# Patient Record
Sex: Male | Born: 1945
Health system: Southern US, Community
[De-identification: ages and names within clinical notes are randomized; demographics above are authoritative.]

## PROBLEM LIST (undated history)

## (undated) DIAGNOSIS — N400 Enlarged prostate without lower urinary tract symptoms: Secondary | ICD-10-CM

## (undated) DIAGNOSIS — M1990 Unspecified osteoarthritis, unspecified site: Secondary | ICD-10-CM

## (undated) DIAGNOSIS — K4020 Bilateral inguinal hernia, without obstruction or gangrene, not specified as recurrent: Secondary | ICD-10-CM

## (undated) DIAGNOSIS — I341 Nonrheumatic mitral (valve) prolapse: Secondary | ICD-10-CM

## (undated) DIAGNOSIS — I340 Nonrheumatic mitral (valve) insufficiency: Secondary | ICD-10-CM

## (undated) DIAGNOSIS — C61 Malignant neoplasm of prostate: Secondary | ICD-10-CM

## (undated) DIAGNOSIS — R011 Cardiac murmur, unspecified: Secondary | ICD-10-CM

## (undated) DIAGNOSIS — C801 Malignant (primary) neoplasm, unspecified: Secondary | ICD-10-CM

## (undated) DIAGNOSIS — D649 Anemia, unspecified: Secondary | ICD-10-CM

## (undated) DIAGNOSIS — J189 Pneumonia, unspecified organism: Secondary | ICD-10-CM

## (undated) HISTORY — DX: Benign prostatic hyperplasia without lower urinary tract symptoms: N40.0

## (undated) HISTORY — DX: Nonrheumatic mitral (valve) insufficiency: I34.0

## (undated) HISTORY — PX: HERNIA REPAIR: SHX51

## (undated) HISTORY — PX: CARDIAC CATHETERIZATION: SHX172

## (undated) HISTORY — PX: PROSTATE BIOPSY: SHX241

## (undated) HISTORY — PX: VASECTOMY: SHX75

## (undated) HISTORY — DX: Nonrheumatic mitral (valve) prolapse: I34.1

## (undated) HISTORY — PX: TONSILLECTOMY AND ADENOIDECTOMY: SUR1326

---

## 1975-11-06 HISTORY — PX: VASECTOMY: SHX75

## 2000-10-15 ENCOUNTER — Ambulatory Visit (HOSPITAL_BASED_OUTPATIENT_CLINIC_OR_DEPARTMENT_OTHER): Admission: RE | Admit: 2000-10-15 | Discharge: 2000-10-15 | Payer: Self-pay | Admitting: Plastic Surgery

## 2012-01-02 ENCOUNTER — Other Ambulatory Visit: Payer: Self-pay | Admitting: Emergency Medicine

## 2012-01-02 ENCOUNTER — Ambulatory Visit (INDEPENDENT_AMBULATORY_CARE_PROVIDER_SITE_OTHER): Payer: Medicare Other | Admitting: Emergency Medicine

## 2012-01-02 VITALS — BP 138/74 | HR 66 | Temp 98.1°F | Resp 16

## 2012-01-02 DIAGNOSIS — Z111 Encounter for screening for respiratory tuberculosis: Secondary | ICD-10-CM

## 2012-01-02 DIAGNOSIS — Z139 Encounter for screening, unspecified: Secondary | ICD-10-CM

## 2012-01-04 NOTE — Progress Notes (Signed)
The patient comes here yearly for PPD placement. He is having no symptoms he is here for testing only.

## 2012-01-05 ENCOUNTER — Ambulatory Visit (INDEPENDENT_AMBULATORY_CARE_PROVIDER_SITE_OTHER): Payer: Medicare Other

## 2012-01-05 DIAGNOSIS — Z111 Encounter for screening for respiratory tuberculosis: Secondary | ICD-10-CM

## 2012-01-05 LAB — TB SKIN TEST: TB Skin Test: NEGATIVE mm

## 2012-01-12 ENCOUNTER — Ambulatory Visit (INDEPENDENT_AMBULATORY_CARE_PROVIDER_SITE_OTHER): Payer: Medicare Other | Admitting: Family Medicine

## 2012-01-12 VITALS — BP 131/75 | HR 65 | Temp 98.1°F | Resp 16 | Ht 69.5 in | Wt 187.0 lb

## 2012-01-12 DIAGNOSIS — J069 Acute upper respiratory infection, unspecified: Secondary | ICD-10-CM

## 2012-01-12 DIAGNOSIS — R05 Cough: Secondary | ICD-10-CM

## 2012-01-12 DIAGNOSIS — J310 Chronic rhinitis: Secondary | ICD-10-CM

## 2012-01-12 DIAGNOSIS — R059 Cough, unspecified: Secondary | ICD-10-CM

## 2012-01-12 DIAGNOSIS — J309 Allergic rhinitis, unspecified: Secondary | ICD-10-CM

## 2012-01-12 MED ORDER — AZITHROMYCIN 250 MG PO TABS: ORAL_TABLET | ORAL | Status: AC

## 2012-01-12 NOTE — Progress Notes (Signed)
  [facility]:  Office Visit  Chief Complaint:  Chief Complaint  Patient presents with  . Cough    productive especially at night x 5 days  . Facial Pain    sinus pain and pressure x 5 days    HPI: Thomas [name] is a 66 y.o. male who complains of  5 day history of productive cough and sinus pain, however sxs are improving. He tried OTC treatment and increased fluids with some relief. Denies fevers, chills, SOB, wheezing. Denies asthma or allergies. Nonsmoker.  Past Medical History  Diagnosis Date  . BPH (benign prostatic hyperplasia)    Past Surgical History  Procedure Date  . Tonsillectomy and adenoidectomy    History   Social History  . Marital Status: Unknown    Spouse Name: N/A    Number of Children: N/A  . Years of Education: N/A   Social History Main Topics  . Smoking status: Never Smoker   . Smokeless tobacco: Not on file  . Alcohol Use: Not on file  . Drug Use: Not on file  . Sexually Active: Not on file   Other Topics Concern  . Not on file   Social History Narrative  . No narrative on file   No family history on file. No Known Allergies Prior to Admission medications   Medication Sig Start Date End Date Taking? Authorizing Provider  aspirin 81 MG tablet Take 81 mg by mouth daily.   Yes Historical Provider, MD  Docosahexaenoic Acid (DHA COMPLETE PO) Take 1 tablet by mouth daily.   Yes Historical Provider, MD  Multiple Vitamin (MULTIVITAMIN) tablet Take 1 tablet by mouth daily.   Yes Historical Provider, MD     ROS: The patient denies fevers, chills, night sweats, unintentional weight loss, chest pain, palpitations, wheezing, dyspnea on exertion, nausea, vomiting, abdominal pain, dysuria, hematuria, melena, numbness, weakness, or tingling.  All other systems have been reviewed and were otherwise negative with the exception of those mentioned in the HPI and as above.    PHYSICAL EXAM: Filed Vitals:   01/12/12 1739  BP: 131/75    Pulse: 65  Temp: 98.1 F (36.7 C)  Resp: 16   Filed Vitals:   01/12/12 1739  Height: 5' 9.5" (1.765 m)  Weight: 187 lb (84.823 kg)   Body mass index is 27.22 kg/(m^2).  General: Alert, no acute distress HEENT:  Normocephalic, atraumatic, oropharynx patent. TM nl,+ red boggy nares. Sinuses nontender Cardiovascular:  Regular rate and rhythm, no rubs murmurs or gallops.  No Carotid bruits, radial pulse intact. No pedal edema.  Respiratory: Clear to auscultation bilaterally.  No wheezes, rales, or rhonchi.  No cyanosis, no use of accessory musculature GI: No organomegaly, abdomen is soft and non-tender, positive bowel sounds.  No masses. Skin: No rashes. Neurologic: Facial musculature symmetric. Psychiatric: Patient is appropriate throughout our interaction. Lymphatic: No cervical lymphadenopathy Musculoskeletal: Gait intact.  ASSESSMENT/PLAN: Encounter Diagnoses  Name Primary?  . Rhinitis Yes  . Cough   . Viral URI with cough     Sxs treatment only. Patient declined flonase. Patient has a rx for Z-pack sent to pharmacy to pick up in 4-5 days if sxs worsen.     Hamilton Capri PHUONG, DO 01/12/2012 6:40 PM

## 2012-10-31 ENCOUNTER — Other Ambulatory Visit: Payer: Self-pay | Admitting: Family Medicine

## 2012-10-31 ENCOUNTER — Ambulatory Visit
Admission: RE | Admit: 2012-10-31 | Discharge: 2012-10-31 | Disposition: A | Discharge status: Home / Self Care | Payer: Medicare Other | Source: Ambulatory Visit | Attending: Family Medicine | Admitting: Family Medicine

## 2012-10-31 DIAGNOSIS — R52 Pain, unspecified: Secondary | ICD-10-CM

## 2013-09-17 ENCOUNTER — Other Ambulatory Visit (HOSPITAL_COMMUNITY): Payer: Self-pay | Admitting: Urology

## 2013-09-17 DIAGNOSIS — C61 Malignant neoplasm of prostate: Secondary | ICD-10-CM

## 2013-11-13 ENCOUNTER — Ambulatory Visit (HOSPITAL_COMMUNITY)
Admission: RE | Admit: 2013-11-13 | Discharge: 2013-11-13 | Disposition: A | Discharge status: Home / Self Care | Payer: Medicare Other | Source: Ambulatory Visit | Attending: Urology | Admitting: Urology

## 2013-11-13 DIAGNOSIS — N402 Nodular prostate without lower urinary tract symptoms: Secondary | ICD-10-CM | POA: Insufficient documentation

## 2013-11-13 DIAGNOSIS — R972 Elevated prostate specific antigen [PSA]: Secondary | ICD-10-CM | POA: Insufficient documentation

## 2013-11-13 DIAGNOSIS — C61 Malignant neoplasm of prostate: Secondary | ICD-10-CM

## 2013-11-13 LAB — POCT I-STAT CREATININE: Creatinine, Ser: 1 mg/dL (ref 0.50–1.35)

## 2013-11-13 MED ORDER — GADOBENATE DIMEGLUMINE 529 MG/ML IV SOLN
20.0000 mL | Freq: Once | INTRAVENOUS | Status: AC | PRN
  Administered 2013-11-13: 18 mL via INTRAVENOUS

## 2014-05-20 ENCOUNTER — Encounter: Payer: Self-pay | Admitting: Cardiology

## 2014-06-03 ENCOUNTER — Encounter: Payer: Self-pay | Admitting: Cardiology

## 2014-07-09 ENCOUNTER — Ambulatory Visit: Payer: Medicare Other | Admitting: Cardiology

## 2014-07-14 ENCOUNTER — Encounter: Payer: Self-pay | Admitting: Cardiology

## 2014-07-14 ENCOUNTER — Ambulatory Visit (INDEPENDENT_AMBULATORY_CARE_PROVIDER_SITE_OTHER): Payer: Medicare Other | Admitting: Cardiology

## 2014-07-14 VITALS — BP 120/78 | HR 65 | Ht 70.0 in | Wt 174.0 lb

## 2014-07-14 DIAGNOSIS — I059 Rheumatic mitral valve disease, unspecified: Secondary | ICD-10-CM

## 2014-07-14 DIAGNOSIS — I341 Nonrheumatic mitral (valve) prolapse: Secondary | ICD-10-CM | POA: Insufficient documentation

## 2014-07-14 NOTE — Progress Notes (Signed)
      [facility]. [address] [place], [place]  [address] Phone: [phone] Fax:  [phone]  Date:  07/14/2014   ID:  Thomas [name], DOB October 04, 1946, MRN [id number]  PCP:  Cammy Copa, MD   History of Present Illness: Thomas [name] is a 68 y.o. male here for followup of mitral valve prolapse (posterior leaflet), moderate mitral regurgitation from echocardiogram of 07/25/2012 (heart murmur). Prior [employer]. Recently had a friend who unfortunately had complications after aortic valve surgery.  He is feeling well, no shortness of breath, no chest pain, no syncope. Quite active.   Wt Readings from Last 3 Encounters:  07/14/14 174 lb (78.926 kg)  01/12/12 187 lb (84.823 kg)     Past Medical History  Diagnosis Date  . BPH (benign prostatic hyperplasia)     Past Surgical History  Procedure Laterality Date  . Tonsillectomy and adenoidectomy      Current Outpatient Prescriptions  Medication Sig Dispense Refill  . aspirin 81 MG tablet Take 81 mg by mouth daily.      . Multiple Vitamin (MULTIVITAMIN) tablet Take 1 tablet by mouth daily.       No current facility-administered medications for this visit.    Allergies:   No Known Allergies  Social History:  The patient  reports that he has never smoked. He does not have any smokeless tobacco history on file.   No family history on file.  ROS:  Please see the history of present illness.   Denies any chest pain, syncope, dyspnea, no palpitations.   All other systems reviewed and negative.   PHYSICAL EXAM: VS:  BP 120/78  Pulse 65  Ht 5\' 10"  (1.778 m)  Wt 174 lb (78.926 kg)  BMI 24.97 kg/m2 Well nourished, well developed, in no acute distress HEENT: normal, [place]/AT, EOMI Neck: no JVD, normal carotid upstroke, no bruit Cardiac:  normal S1, S2; RRR; 3/6 holosystolic apical murmur Lungs:  clear to auscultation bilaterally, no wheezing, rhonchi or rales Abd: soft, nontender, no hepatomegaly, no  bruits Ext: no edema, 2+ distal pulses Skin: warm and dry GU: deferred Neuro: no focal abnormalities noted, AAO x 3  EKG:  07/14/14-sinus rhythm, heart rate 65, slightly peaked T-wave in V3, V4, no other significant abnormalities.      ASSESSMENT AND PLAN:  1. Mitral valve regurgitation - previously moderate with posterior leaflet prolapse. We will check echocardiogram. He knows to contact me if symptoms occur. We discussed mitral valve repair in the setting of severe mitral regurgitation. 2. One-year followup  Signed, Candee Furbish, MD [facility]  07/14/2014 8:43 AM

## 2014-07-14 NOTE — Patient Instructions (Signed)
The current medical regimen is effective;  continue present plan and medications.  Your physician has requested that you have an echocardiogram. Echocardiography is a painless test that uses sound waves to create images of your heart. It provides your doctor with information about the size and shape of your heart and how well your heart's chambers and valves are working. This procedure takes approximately one hour. There are no restrictions for this procedure.  Follow up in 1 year with Dr. [name].  You will receive a letter in the mail 2 months before you are due.  Please call us when you receive this letter to schedule your follow up appointment.

## 2014-07-19 ENCOUNTER — Ambulatory Visit (HOSPITAL_COMMUNITY): Payer: Medicare Other | Attending: Cardiology | Admitting: Radiology

## 2014-07-19 DIAGNOSIS — R011 Cardiac murmur, unspecified: Secondary | ICD-10-CM | POA: Insufficient documentation

## 2014-07-19 DIAGNOSIS — I059 Rheumatic mitral valve disease, unspecified: Secondary | ICD-10-CM | POA: Diagnosis not present

## 2014-07-19 NOTE — Progress Notes (Signed)
Echocardiogram performed.  

## 2014-07-21 ENCOUNTER — Telehealth: Payer: Self-pay | Admitting: Cardiology

## 2014-07-21 NOTE — Telephone Encounter (Signed)
Follow up ° ° ° ° ° °Returning Anita's call °

## 2014-07-21 NOTE — Telephone Encounter (Signed)
Left pt a message to call back. 

## 2014-07-21 NOTE — Telephone Encounter (Signed)
New message ° ° ° ° ° °Want echo results °

## 2014-07-22 NOTE — Telephone Encounter (Signed)
Follow up   ° ° ° °Patient calling back for test results  °

## 2014-07-22 NOTE — Telephone Encounter (Signed)
Pt informed of echo results.

## 2016-11-14 DIAGNOSIS — D225 Melanocytic nevi of trunk: Secondary | ICD-10-CM | POA: Diagnosis not present

## 2016-11-14 DIAGNOSIS — X32XXXD Exposure to sunlight, subsequent encounter: Secondary | ICD-10-CM | POA: Diagnosis not present

## 2016-11-14 DIAGNOSIS — L570 Actinic keratosis: Secondary | ICD-10-CM | POA: Diagnosis not present

## 2016-12-26 DIAGNOSIS — C61 Malignant neoplasm of prostate: Secondary | ICD-10-CM | POA: Diagnosis not present

## 2017-03-08 DIAGNOSIS — C61 Malignant neoplasm of prostate: Secondary | ICD-10-CM | POA: Diagnosis not present

## 2017-04-30 DIAGNOSIS — R1033 Periumbilical pain: Secondary | ICD-10-CM | POA: Diagnosis not present

## 2017-04-30 DIAGNOSIS — X32XXXD Exposure to sunlight, subsequent encounter: Secondary | ICD-10-CM | POA: Diagnosis not present

## 2017-04-30 DIAGNOSIS — K4091 Unilateral inguinal hernia, without obstruction or gangrene, recurrent: Secondary | ICD-10-CM | POA: Diagnosis not present

## 2017-04-30 DIAGNOSIS — L570 Actinic keratosis: Secondary | ICD-10-CM | POA: Diagnosis not present

## 2017-05-06 DIAGNOSIS — K4010 Bilateral inguinal hernia, with gangrene, not specified as recurrent: Secondary | ICD-10-CM | POA: Diagnosis not present

## 2017-05-14 DIAGNOSIS — R1084 Generalized abdominal pain: Secondary | ICD-10-CM | POA: Diagnosis not present

## 2017-05-28 ENCOUNTER — Encounter (INDEPENDENT_AMBULATORY_CARE_PROVIDER_SITE_OTHER): Payer: Self-pay

## 2017-05-28 ENCOUNTER — Encounter: Payer: Self-pay | Admitting: Cardiology

## 2017-05-28 ENCOUNTER — Ambulatory Visit (INDEPENDENT_AMBULATORY_CARE_PROVIDER_SITE_OTHER): Payer: PPO | Admitting: Cardiology

## 2017-05-28 VITALS — BP 116/80 | HR 68 | Ht 70.0 in | Wt 180.4 lb

## 2017-05-28 DIAGNOSIS — Z01810 Encounter for preprocedural cardiovascular examination: Secondary | ICD-10-CM | POA: Diagnosis not present

## 2017-05-28 DIAGNOSIS — I340 Nonrheumatic mitral (valve) insufficiency: Secondary | ICD-10-CM

## 2017-05-28 NOTE — Progress Notes (Signed)
[facility]. [address], [address] [place], [place]  [address] Phone: [phone] Fax:  [phone]  Date:  05/28/2017   ID:  Thomas [name], DOB 1946-10-06, MRN [id number]  PCP:  [name], MD   History of Present Illness: Thomas [name] is a 71 y.o. male here for preoperative risk stratification prior to hernia surgery and followup of mitral valve prolapse (posterior leaflet), moderate mitral regurgitation from echocardiogram of 07/25/2012 (heart murmur). Prior [employer]. Had a friend who unfortunately had complications after aortic valve surgery.  Overall not having any new symptoms, no chest pain, no shortness of breath, no syncope. Prior echocardiogram in 2015 showed only mild mitral regurgitation with his associated mitral valve prolapse. He is about to undergo hernia repair. Denies any bleeding. He has been enjoying retirement. Every 7-8 weeks he will drive up to the [place] and spent time with his friend.  He had an anesthesia evaluation yesterday, EKG did present with artifact. Murmur noted.   Wt Readings from Last 3 Encounters:  05/28/17 180 lb 6.4 oz (81.8 kg)  07/14/14 174 lb (78.9 kg)  01/12/12 187 lb (84.8 kg)     Past Medical History:  Diagnosis Date  . BPH (benign prostatic hyperplasia)     Past Surgical History:  Procedure Laterality Date  . TONSILLECTOMY AND ADENOIDECTOMY      Current Outpatient Prescriptions  Medication Sig Dispense Refill  . aspirin 81 MG tablet Take 81 mg by mouth daily.    . Multiple Vitamin (MULTIVITAMIN) tablet Take 1 tablet by mouth daily.     No current facility-administered medications for this visit.     Allergies:   No Known Allergies  Social History:  The patient  reports that he has never smoked. He has never used smokeless tobacco.   No family history on file.  ROS:  Please see the history of present illness.   Denies any chest pain, syncope, dyspnea, no palpitations.    All other systems reviewed and negative.   PHYSICAL EXAM: VS:  BP 116/80   Pulse 68   Ht 5\' 10"  (1.778 m)   Wt 180 lb 6.4 oz (81.8 kg)   SpO2 97%   BMI 25.88 kg/m  GEN: Well nourished, well developed, in no acute distress  HEENT: normal  Neck: no JVD, carotid bruits, or masses Cardiac: RRR; 3/6 HSM, no rubs, or gallops,no edema  Respiratory:  clear to auscultation bilaterally, normal work of breathing GI: soft, nontender, nondistended, + BS MS: no deformity or atrophy  Skin: warm and dry, no rash Neuro:  Alert and Oriented x 3, Strength and sensation are intact Psych: euthymic mood, full affect   EKG: Today 7/24/-soft normal sinus rhythm 68 with no other abnormalities.  07/14/14-sinus rhythm, heart rate 65, slightly peaked T-wave in V3, V4, no other significant abnormalities.     Echo: 07/19/14  - Left ventricle: The cavity size was normal. Wall thickness was normal. Systolic function was normal. The estimated ejection fraction was in the range of 60% to 65%. Features are consistent with a pseudonormal left ventricular filling pattern, with concomitant abnormal relaxation and increased filling pressure (grade 2 diastolic dysfunction). - Aortic valve: There was trivial regurgitation. - Mitral valve: There was mild regurgitation. - Left atrium: The atrium was moderately to severely dilated. - Pulmonic valve: There was moderate regurgitation.   ASSESSMENT AND PLAN:  1. Preoperative risk assessment prior to inguinal hernia surgery -  he is able to complete greater than 4 METS of activity without difficulty. Last echocardiogram 3 years ago demonstrated only mild mitral regurgitation. We will repeat echocardiogram prior to surgery. If his echocardiogram is unchanged, he may proceed with surgery with low to moderate overall cardiac risk. 2. Mitral valve regurgitation - previously mild to moderate with posterior leaflet prolapse. We will check echocardiogram again since it is  been 3 years. He knows to contact me if symptoms occur. We discussed mitral valve repair in the setting of severe mitral regurgitation. It will be nice to know this to his surgery. 3. One-year followup  Signed, Candee Furbish, MD [facility]  05/28/2017 2:42 PM

## 2017-05-28 NOTE — Patient Instructions (Signed)
Medication Instructions:  Your physician recommends that you continue on your current medications as directed. Please refer to the Current Medication list given to you today.   Labwork: None   Testing/Procedures: Your physician has requested that you have an echocardiogram. Echocardiography is a painless test that uses sound waves to create images of your heart. It provides your doctor with information about the size and shape of your heart and how well your heart's chambers and valves are working. This procedure takes approximately one hour. There are no restrictions for this procedure.    Follow-Up: Your physician wants you to follow-up in: 1 year with [name]. (July 2019) You will receive a reminder letter in the mail two months in advance. If you don't receive a letter, please call our office to schedule the follow-up appointment.   Any Other Special Instructions Will Be Listed Below (If Applicable).     If you need a refill on your cardiac medications before your next appointment, please call your pharmacy.

## 2017-05-29 ENCOUNTER — Other Ambulatory Visit: Payer: Self-pay

## 2017-05-29 ENCOUNTER — Ambulatory Visit (HOSPITAL_COMMUNITY): Payer: PPO | Attending: Internal Medicine

## 2017-05-29 DIAGNOSIS — I340 Nonrheumatic mitral (valve) insufficiency: Secondary | ICD-10-CM | POA: Diagnosis not present

## 2017-05-31 ENCOUNTER — Ambulatory Visit: Payer: Self-pay | Admitting: General Surgery

## 2017-05-31 NOTE — H&P (Signed)
Thomas [name] 05/06/2017 10:38 AM Location: [facility] Patient #: [id number] DOB: 11-20-1945 Single / Language: Undefined / Race: Refused to Report/Unreported Male   History of Present Illness Thomas Hollingshead MD; 05/06/2017 11:49 AM) The patient is a 71 year old male.  Note:He is referred by Dr. Sheryn Bison (initially to Dr. Harlow Asa) for consultation regarding a left hernia. He was seeing Dr. Sheryn Bison because of some periumbilical abdominal pain. He had also noted a bulge in the left inguinal area. The bulge in the left inguinal area gets larger when he stands up. No difficulty with urination or constipation. He does have prostate cancer which is low-grade and is being followed closely. He initially saw Dr. Harlow Asa who diagnosed him with a small to moderate size right inguinal hernia and suggested consideration of laparoscopic repair. Dr. Harlow Asa thus asked me to see him and discussed laparoscopic repair with him.  Past Surgical History Thomas Lorenzo, LPN; 07/08/8100 75:10 AM) Tonsillectomy  Vasectomy   Diagnostic Studies History Thomas Lorenzo, LPN; 12/10/8525 78:24 AM) Colonoscopy  1-5 years ago  Allergies Thomas Lorenzo, LPN; 12/09/5359 44:31 AM) No Known Allergies 05/06/2017 Allergies Reconciled   Medication History Thomas Lorenzo, LPN; 03/08/85 76:19 AM) Calcium (500MG  Tablet, Oral) Active. Multivitamin Adult (Oral) Active. Vitamin D (Cholecalciferol) (1000UNIT Capsule, Oral) Active. Aspirin (81MG  Tablet DR, Oral) Active. Probiotic (Oral) Active. Medications Reconciled  Social History Thomas Lorenzo, LPN; 5/0/9326 71:24 AM) Alcohol use  Occasional alcohol use. Caffeine use  Tea. No drug use  Tobacco use  Never smoker.  Family History Thomas Lorenzo, LPN; 03/12/997 33:82 AM) Diabetes Mellitus  Father. Heart Disease  Mother. Respiratory Condition  Father.  Other Problems Thomas Lorenzo, LPN; 5/0/5397 67:34 AM) Enlarged Prostate  Heart  murmur  Prostate Cancer     Review of Systems Thomas Billings Dockery LPN; 11/13/3788 24:09 AM) General Not Present- Appetite Loss, Chills, Fatigue, Fever, Night Sweats, Weight Gain and Weight Loss. Skin Not Present- Change in Wart/Mole, Dryness, Hives, Jaundice, New Lesions, Non-Healing Wounds, Rash and Ulcer. HEENT Not Present- Earache, Hearing Loss, Hoarseness, Nose Bleed, Oral Ulcers, Ringing in the Ears, Seasonal Allergies, Sinus Pain, Sore Throat, Visual Disturbances, Wears glasses/contact lenses and Yellow Eyes. Respiratory Present- Snoring. Not Present- Bloody sputum, Chronic Cough, Difficulty Breathing and Wheezing. Cardiovascular Present- Leg Cramps. Not Present- Chest Pain, Difficulty Breathing Lying Down, Palpitations, Rapid Heart Rate, Shortness of Breath and Swelling of Extremities. Gastrointestinal Present- Abdominal Pain. Not Present- Bloating, Bloody Stool, Change in Bowel Habits, Chronic diarrhea, Constipation, Difficulty Swallowing, Excessive gas, Gets full quickly at meals, Hemorrhoids, Indigestion, Nausea, Rectal Pain and Vomiting. Male Genitourinary Present- Nocturia. Not Present- Blood in Urine, Change in Urinary Stream, Frequency, Impotence, Painful Urination, Urgency and Urine Leakage.  Vitals Thomas Billings Dockery LPN; 05/07/5328 92:42 AM) 05/06/2017 10:38 AM Weight: 182 lb Height: 70in Body Surface Area: 2.01 m Body Mass Index: 26.11 kg/m  Temp.: 97.16F(Oral)  Pulse: 70 (Regular)  BP: 116/68 (Sitting, Left Arm, Standard)       Physical Exam Thomas Hollingshead MD; 05/06/2017 11:51 AM) The physical exam findings are as follows: Note:GENERAL APPEARANCE: WDWN in NAD. Pleasant and cooperative.  EARS, NOSE, MOUTH THROAT: [place]/AT external ears: no lesions or deformities external nose: no lesions or deformities hearing: grossly normal lips: moist, no deformities EYES external: conjunctiva, lids, sclerae normal pupils: equal, round glasses: no/yes  NECK: Supple, no  obvious mass or thyroid mass/enlargement, no trachea deviation  CV ascultation: RRR, no murmur extremity edema: no extremity varicosities: no  RESP/CHEST auscultation: breath sounds  equal and clear respiratory effort: normal  GASTROINTESTINAL abdomen: Soft, non-tender, non-distended, no masses liver and spleen: not enlarged. hernia: Moderate-size to large reducible left inguinal hernia, small to moderate-size reducible right inguinal hernia. scar: none present  GENITOURINARY scrotum: no masses penis: no lesions  MUSCULOSKELETAL station and gait: normal digits/nails: no clubbing or cyanosis muscle strength: grossly normal all extremities deformities: none instability: none  SKIN jaundice: none rash or lesion: none  NEUROLOGIC speech: normal  PSYCHIATRIC alertness and orientation: normal mood/affect/behavior: normal judgement and insight: normal    Assessment & Plan Thomas Hollingshead MD; 05/06/2017 11:51 AM) BILATERAL INGUINAL HERNIA WITH GANGRENE, RECURRENCE NOT SPECIFIED (K40.10) Impression: Left side is moderate to large, right side is moderate. Both are reducible. He is interested in repair. I feel he is a good candidate for laparoscopic repair. I am not sure this is etiology of his periumbilical abdominal pain. He has been referred to gastroenterology for further evaluation of this.  Plan: Laparoscopic bilateral inguinal hernia repair with mesh. I have explained the procedure, risks, and aftercare of inguinal hernia repair. Risks include but are not limited to bleeding, infection, wound problems, anesthesia, recurrence, bladder or intestine injury, urinary retention, testicular dysfunction, chronic pain, mesh problems. He seems to understand and agrees with the plan. We also discussed what to do if he ends up with a rare complication of an incarcerated hernia (go straight to the emergency room). Current Plans Follow up as needed Pt Education - [facility] Free Text  Education/Instructions: discussed with patient and provided information. Schedule for Surgery Addendum Note(Kaydenn Mclear Adalberto Cole MD; 05/31/2017 4:33 PM) He was scheduled to have his coronary repair at [facility] [place]. He has a chronic heart murmur in the anesthesiologist there felt to need to do see his cardiologist again and have this worked up. He has seen the cardiologist and they feel he is at low risk for proceeding with a laparoscopic bilateral inguinal hernia repair. Therefore, we will reschedule the operation.  Jackolyn Confer, M.D.

## 2017-06-10 DIAGNOSIS — H2513 Age-related nuclear cataract, bilateral: Secondary | ICD-10-CM | POA: Diagnosis not present

## 2017-06-10 DIAGNOSIS — H5201 Hypermetropia, right eye: Secondary | ICD-10-CM | POA: Diagnosis not present

## 2017-06-10 DIAGNOSIS — H52223 Regular astigmatism, bilateral: Secondary | ICD-10-CM | POA: Diagnosis not present

## 2017-06-10 DIAGNOSIS — H5212 Myopia, left eye: Secondary | ICD-10-CM | POA: Diagnosis not present

## 2017-07-15 NOTE — Progress Notes (Signed)
05-28-17 (EPIC) EKG  05-29-17 (EPIC) ECHO, no stenosis, no regurgitation

## 2017-07-15 NOTE — Patient Instructions (Addendum)
Bryor Rami III  07/15/2017   Your procedure is scheduled on: 07-22-17  Report to [facility] Main  Entrance Take [place] Elevators to 3rd floor to  [place] at 5:30 AM.    Call this number if you have problems the morning of surgery [phone]    Remember: ONLY 1 PERSON MAY GO WITH YOU TO SHORT STAY TO GET  READY MORNING OF [place].  Do not eat food or drink liquids :After Midnight.     Take these medicines the morning of surgery with A SIP OF WATER: None                                You may not have any metal on your body including hair pins and              piercings  Do not wear jewelry, make-up, lotions, powders or perfumes, deodorant                      Men may shave face and neck.   Do not bring valuables to the hospital. [place].  Contacts, dentures or bridgework may not be worn into surgery.      Patients discharged the day of surgery will not be allowed to drive home.  Name and phone number of your driver:  Special Instructions: N/A              Please read over the following fact sheets you were given: _____________________________________________________________________            [facility] - Preparing for Surgery Before surgery, you can play an important role.  Because skin is not sterile, your skin needs to be as free of germs as possible.  You can reduce the number of germs on your skin by washing with CHG (chlorahexidine gluconate) soap before surgery.  CHG is an antiseptic cleaner which kills germs and bonds with the skin to continue killing germs even after washing. Please DO NOT use if you have an allergy to CHG or antibacterial soaps.  If your skin becomes reddened/irritated stop using the CHG and inform your nurse when you arrive at Short Stay. Do not shave (including legs and underarms) for at least 48 hours prior to the first CHG shower.  You may shave your  face/neck. Please follow these instructions carefully:  1.  Shower with CHG Soap the night before surgery and the  morning of Surgery.  2.  If you choose to wash your hair, wash your hair first as usual with your  normal  shampoo.  3.  After you shampoo, rinse your hair and body thoroughly to remove the  shampoo.                           4.  Use CHG as you would any other liquid soap.  You can apply chg directly  to the skin and wash                       Gently with a scrungie or clean washcloth.  5.  Apply the CHG Soap to your body ONLY FROM THE NECK  DOWN.   Do not use on face/ open                           Wound or open sores. Avoid contact with eyes, ears mouth and genitals (private parts).                       Wash face,  Genitals (private parts) with your normal soap.             6.  Wash thoroughly, paying special attention to the area where your surgery  will be performed.  7.  Thoroughly rinse your body with warm water from the neck down.  8.  DO NOT shower/wash with your normal soap after using and rinsing off  the CHG Soap.                9.  Pat yourself dry with a clean towel.            10.  Wear clean pajamas.            11.  Place clean sheets on your bed the night of your first shower and do not  sleep with pets. Day of Surgery : Do not apply any lotions/deodorants the morning of surgery.  Please wear clean clothes to the hospital/surgery center.  FAILURE TO FOLLOW THESE INSTRUCTIONS MAY RESULT IN THE CANCELLATION OF YOUR SURGERY PATIENT SIGNATURE_________________________________  NURSE SIGNATURE__________________________________  ________________________________________________________________________

## 2017-07-16 ENCOUNTER — Encounter (HOSPITAL_COMMUNITY): Payer: Self-pay

## 2017-07-16 ENCOUNTER — Encounter (INDEPENDENT_AMBULATORY_CARE_PROVIDER_SITE_OTHER): Payer: Self-pay

## 2017-07-16 ENCOUNTER — Encounter (HOSPITAL_COMMUNITY)
Admission: RE | Admit: 2017-07-16 | Discharge: 2017-07-16 | Disposition: A | Discharge status: Home / Self Care | Payer: PPO | Source: Ambulatory Visit | Attending: General Surgery | Admitting: General Surgery

## 2017-07-16 ENCOUNTER — Encounter (HOSPITAL_COMMUNITY): Admission: RE | Admit: 2017-07-16 | Payer: PPO | Source: Ambulatory Visit

## 2017-07-16 DIAGNOSIS — K4020 Bilateral inguinal hernia, without obstruction or gangrene, not specified as recurrent: Secondary | ICD-10-CM | POA: Insufficient documentation

## 2017-07-16 DIAGNOSIS — Z01812 Encounter for preprocedural laboratory examination: Secondary | ICD-10-CM | POA: Diagnosis not present

## 2017-07-16 HISTORY — DX: Nonrheumatic mitral (valve) prolapse: I34.1

## 2017-07-16 HISTORY — DX: Malignant (primary) neoplasm, unspecified: C80.1

## 2017-07-16 HISTORY — DX: Bilateral inguinal hernia, without obstruction or gangrene, not specified as recurrent: K40.20

## 2017-07-16 LAB — CBC
HCT: 41.3 % (ref 39.0–52.0)
HEMOGLOBIN: 13.9 g/dL (ref 13.0–17.0)
MCH: 32.8 pg (ref 26.0–34.0)
MCHC: 33.7 g/dL (ref 30.0–36.0)
MCV: 97.4 fL (ref 78.0–100.0)
PLATELETS: 236 10*3/uL (ref 150–400)
RBC: 4.24 MIL/uL (ref 4.22–5.81)
RDW: 13.3 % (ref 11.5–15.5)
WBC: 6.9 10*3/uL (ref 4.0–10.5)

## 2017-07-16 LAB — BASIC METABOLIC PANEL
ANION GAP: 6 (ref 5–15)
BUN: 22 mg/dL — ABNORMAL HIGH (ref 6–20)
CO2: 27 mmol/L (ref 22–32)
Calcium: 9.6 mg/dL (ref 8.9–10.3)
Chloride: 104 mmol/L (ref 101–111)
Creatinine, Ser: 0.87 mg/dL (ref 0.61–1.24)
GFR calc Af Amer: >60 mL/min (ref >60)
GLUCOSE: 96 mg/dL (ref 65–99)
POTASSIUM: 4.1 mmol/L (ref 3.5–5.1)
SODIUM: 137 mmol/L (ref 135–145)

## 2017-07-16 NOTE — Progress Notes (Signed)
[facility] cardiology clearance [name] 05-28-17 epic   ECHO 7-25-1  Epic   EKG 05-28-17 epic

## 2017-07-21 NOTE — Anesthesia Preprocedure Evaluation (Addendum)
Anesthesia Evaluation  Patient identified by MRN, date of birth, ID band Patient awake    Reviewed: Allergy & Precautions, NPO status , Patient's Chart, lab work & pertinent test results  Airway Mallampati: II  TM Distance: >3 FB Neck ROM: Full    Dental no notable dental hx.    Pulmonary    breath sounds clear to auscultation       Cardiovascular + Valvular Problems/Murmurs  Rhythm:Regular Rate:Normal + Systolic murmurs Mitral valve prolapse   Neuro/Psych    GI/Hepatic Neg liver ROS, hernias   Endo/Other  negative endocrine ROS  Renal/GU negative Renal ROS     Musculoskeletal   Abdominal   Peds  Hematology negative hematology ROS (+)   Anesthesia Other Findings   Reproductive/Obstetrics                            Anesthesia Physical Anesthesia Plan  ASA: II  Anesthesia Plan: General   Post-op Pain Management:    Induction: Intravenous  PONV Risk Score and Plan: 3 and Ondansetron, Dexamethasone and Midazolam  Airway Management Planned: Oral ETT  Additional Equipment:   Intra-op Plan:   Post-operative Plan: Extubation in OR  Informed Consent:   Plan Discussed with: CRNA  Anesthesia Plan Comments:         Anesthesia Quick Evaluation

## 2017-07-22 ENCOUNTER — Encounter (HOSPITAL_COMMUNITY): Payer: Self-pay | Admitting: *Deleted

## 2017-07-22 ENCOUNTER — Ambulatory Visit (HOSPITAL_COMMUNITY): Payer: PPO | Admitting: Anesthesiology

## 2017-07-22 ENCOUNTER — Ambulatory Visit (HOSPITAL_COMMUNITY)
Admission: RE | Admit: 2017-07-22 | Discharge: 2017-07-22 | Disposition: A | Discharge status: Home / Self Care | Payer: PPO | Source: Ambulatory Visit | Attending: General Surgery | Admitting: General Surgery

## 2017-07-22 ENCOUNTER — Encounter (HOSPITAL_COMMUNITY)
Admission: RE | Disposition: A | Discharge status: Home / Self Care | Payer: Self-pay | Source: Ambulatory Visit | Attending: General Surgery

## 2017-07-22 DIAGNOSIS — Z836 Family history of other diseases of the respiratory system: Secondary | ICD-10-CM | POA: Diagnosis not present

## 2017-07-22 DIAGNOSIS — K4190 Unilateral femoral hernia, without obstruction or gangrene, not specified as recurrent: Secondary | ICD-10-CM | POA: Diagnosis not present

## 2017-07-22 DIAGNOSIS — N400 Enlarged prostate without lower urinary tract symptoms: Secondary | ICD-10-CM | POA: Insufficient documentation

## 2017-07-22 DIAGNOSIS — Z833 Family history of diabetes mellitus: Secondary | ICD-10-CM | POA: Diagnosis not present

## 2017-07-22 DIAGNOSIS — C61 Malignant neoplasm of prostate: Secondary | ICD-10-CM | POA: Diagnosis not present

## 2017-07-22 DIAGNOSIS — Z8249 Family history of ischemic heart disease and other diseases of the circulatory system: Secondary | ICD-10-CM | POA: Diagnosis not present

## 2017-07-22 DIAGNOSIS — K4020 Bilateral inguinal hernia, without obstruction or gangrene, not specified as recurrent: Secondary | ICD-10-CM | POA: Diagnosis not present

## 2017-07-22 DIAGNOSIS — Z7982 Long term (current) use of aspirin: Secondary | ICD-10-CM | POA: Diagnosis not present

## 2017-07-22 DIAGNOSIS — I341 Nonrheumatic mitral (valve) prolapse: Secondary | ICD-10-CM | POA: Diagnosis not present

## 2017-07-22 HISTORY — PX: INSERTION OF MESH: SHX5868

## 2017-07-22 HISTORY — PX: FEMORAL HERNIA REPAIR: SHX632

## 2017-07-22 HISTORY — PX: INGUINAL HERNIA REPAIR: SHX194

## 2017-07-22 SURGERY — REPAIR, HERNIA, INGUINAL, BILATERAL, LAPAROSCOPIC
Anesthesia: General | Laterality: Right

## 2017-07-22 MED ORDER — PROPOFOL 10 MG/ML IV BOLUS
INTRAVENOUS | Status: DC | PRN
  Administered 2017-07-22: 200 mg via INTRAVENOUS

## 2017-07-22 MED ORDER — CHLORHEXIDINE GLUCONATE CLOTH 2 % EX PADS: 6.0000 | MEDICATED_PAD | Freq: Once | CUTANEOUS | Status: DC

## 2017-07-22 MED ORDER — BUPIVACAINE-EPINEPHRINE 0.5% -1:200000 IJ SOLN
INTRAMUSCULAR | Status: DC | PRN
  Administered 2017-07-22: 9 mL

## 2017-07-22 MED ORDER — ONDANSETRON HCL 4 MG/2ML IJ SOLN
INTRAMUSCULAR | Status: DC | PRN
  Administered 2017-07-22: 4 mg via INTRAVENOUS

## 2017-07-22 MED ORDER — PHENYLEPHRINE 40 MCG/ML (10ML) SYRINGE FOR IV PUSH (FOR BLOOD PRESSURE SUPPORT)
PREFILLED_SYRINGE | INTRAVENOUS | Status: DC | PRN
  Administered 2017-07-22 (×2): 80 ug via INTRAVENOUS

## 2017-07-22 MED ORDER — ONDANSETRON HCL 4 MG/2ML IJ SOLN
INTRAMUSCULAR | Status: AC
  Filled 2017-07-22: qty 2

## 2017-07-22 MED ORDER — PHENYLEPHRINE 40 MCG/ML (10ML) SYRINGE FOR IV PUSH (FOR BLOOD PRESSURE SUPPORT)
PREFILLED_SYRINGE | INTRAVENOUS | Status: AC
  Filled 2017-07-22: qty 10

## 2017-07-22 MED ORDER — FENTANYL CITRATE (PF) 100 MCG/2ML IJ SOLN
INTRAMUSCULAR | Status: AC
  Filled 2017-07-22: qty 2

## 2017-07-22 MED ORDER — OXYCODONE HCL 5 MG PO TABS: 5.0000 mg | ORAL_TABLET | ORAL | 0 refills | Status: DC | PRN

## 2017-07-22 MED ORDER — MIDAZOLAM HCL 5 MG/5ML IJ SOLN
INTRAMUSCULAR | Status: DC | PRN
  Administered 2017-07-22: 2 mg via INTRAVENOUS

## 2017-07-22 MED ORDER — FENTANYL CITRATE (PF) 100 MCG/2ML IJ SOLN
INTRAMUSCULAR | Status: DC | PRN
  Administered 2017-07-22 (×4): 50 ug via INTRAVENOUS

## 2017-07-22 MED ORDER — CEFAZOLIN SODIUM-DEXTROSE 2-4 GM/100ML-% IV SOLN
2.0000 g | INTRAVENOUS | Status: AC
  Administered 2017-07-22: 2 g via INTRAVENOUS

## 2017-07-22 MED ORDER — HYDROMORPHONE HCL-NACL 0.5-0.9 MG/ML-% IV SOSY: 0.2500 mg | PREFILLED_SYRINGE | INTRAVENOUS | Status: DC | PRN

## 2017-07-22 MED ORDER — PROMETHAZINE HCL 25 MG/ML IJ SOLN
INTRAMUSCULAR | Status: AC
  Filled 2017-07-22: qty 1

## 2017-07-22 MED ORDER — KETOROLAC TROMETHAMINE 30 MG/ML IJ SOLN
INTRAMUSCULAR | Status: AC
  Administered 2017-07-22: 30 mg via INTRAVENOUS
  Filled 2017-07-22: qty 1

## 2017-07-22 MED ORDER — LIDOCAINE 2% (20 MG/ML) 5 ML SYRINGE
INTRAMUSCULAR | Status: DC | PRN
  Administered 2017-07-22: 100 mg via INTRAVENOUS

## 2017-07-22 MED ORDER — OXYCODONE HCL 5 MG PO TABS: 5.0000 mg | ORAL_TABLET | Freq: Once | ORAL | Status: DC | PRN

## 2017-07-22 MED ORDER — ROCURONIUM BROMIDE 10 MG/ML (PF) SYRINGE
PREFILLED_SYRINGE | INTRAVENOUS | Status: DC | PRN
  Administered 2017-07-22: 10 mg via INTRAVENOUS
  Administered 2017-07-22: 40 mg via INTRAVENOUS

## 2017-07-22 MED ORDER — DEXAMETHASONE SODIUM PHOSPHATE 10 MG/ML IJ SOLN
INTRAMUSCULAR | Status: DC | PRN
  Administered 2017-07-22: 10 mg via INTRAVENOUS

## 2017-07-22 MED ORDER — KETOROLAC TROMETHAMINE 30 MG/ML IJ SOLN
30.0000 mg | Freq: Once | INTRAMUSCULAR | Status: AC
  Administered 2017-07-22: 30 mg via INTRAVENOUS

## 2017-07-22 MED ORDER — PROPOFOL 10 MG/ML IV BOLUS
INTRAVENOUS | Status: AC
  Filled 2017-07-22: qty 20

## 2017-07-22 MED ORDER — CEFAZOLIN SODIUM-DEXTROSE 2-4 GM/100ML-% IV SOLN
INTRAVENOUS | Status: AC
  Filled 2017-07-22: qty 100

## 2017-07-22 MED ORDER — PROMETHAZINE HCL 25 MG/ML IJ SOLN
6.2500 mg | INTRAMUSCULAR | Status: DC | PRN
  Administered 2017-07-22: 6.25 mg via INTRAVENOUS

## 2017-07-22 MED ORDER — DEXAMETHASONE SODIUM PHOSPHATE 10 MG/ML IJ SOLN
INTRAMUSCULAR | Status: AC
  Filled 2017-07-22: qty 1

## 2017-07-22 MED ORDER — LACTATED RINGERS IV SOLN
INTRAVENOUS | Status: DC | PRN
  Administered 2017-07-22 (×2): via INTRAVENOUS

## 2017-07-22 MED ORDER — SUCCINYLCHOLINE CHLORIDE 200 MG/10ML IV SOSY
PREFILLED_SYRINGE | INTRAVENOUS | Status: DC | PRN
  Administered 2017-07-22: 100 mg via INTRAVENOUS

## 2017-07-22 MED ORDER — BUPIVACAINE-EPINEPHRINE (PF) 0.5% -1:200000 IJ SOLN
INTRAMUSCULAR | Status: AC
  Filled 2017-07-22: qty 30

## 2017-07-22 MED ORDER — OXYCODONE HCL 5 MG/5ML PO SOLN
5.0000 mg | Freq: Once | ORAL | Status: DC | PRN
  Filled 2017-07-22: qty 5

## 2017-07-22 MED ORDER — MIDAZOLAM HCL 2 MG/2ML IJ SOLN
INTRAMUSCULAR | Status: AC
Start: 2017-07-22 — End: 2017-07-22
  Filled 2017-07-22: qty 2

## 2017-07-22 MED ORDER — SUCCINYLCHOLINE CHLORIDE 200 MG/10ML IV SOSY
PREFILLED_SYRINGE | INTRAVENOUS | Status: AC
  Filled 2017-07-22: qty 10

## 2017-07-22 MED ORDER — LIDOCAINE 2% (20 MG/ML) 5 ML SYRINGE
INTRAMUSCULAR | Status: AC
  Filled 2017-07-22: qty 5

## 2017-07-22 MED ORDER — SUGAMMADEX SODIUM 200 MG/2ML IV SOLN
INTRAVENOUS | Status: DC | PRN
  Administered 2017-07-22: 200 mg via INTRAVENOUS

## 2017-07-22 MED ORDER — HYDROMORPHONE HCL-NACL 0.5-0.9 MG/ML-% IV SOSY
PREFILLED_SYRINGE | INTRAVENOUS | Status: AC
  Filled 2017-07-22: qty 1

## 2017-07-22 MED ORDER — ROCURONIUM BROMIDE 50 MG/5ML IV SOSY
PREFILLED_SYRINGE | INTRAVENOUS | Status: AC
  Filled 2017-07-22: qty 5

## 2017-07-22 MED FILL — oxyCODONE HCL 5 MG TABS: 5 | 5 days supply | Qty: 30 | Fill #0

## 2017-07-22 SURGICAL SUPPLY — 35 items
APL SKNCLS STERI-STRIP NONHPOA (GAUZE/BANDAGES/DRESSINGS) ×3
APPLIER CLIP 5 13 M/L LIGAMAX5 (MISCELLANEOUS)
APR CLP MED LRG 5 ANG JAW (MISCELLANEOUS)
BENZOIN TINCTURE PRP APPL 2/3 (GAUZE/BANDAGES/DRESSINGS) ×5 IMPLANT
CABLE HIGH FREQUENCY MONO STRZ (ELECTRODE) ×5 IMPLANT
CHLORAPREP W/TINT 26ML (MISCELLANEOUS) ×5 IMPLANT
CLIP APPLIE 5 13 M/L LIGAMAX5 (MISCELLANEOUS) IMPLANT
CLOSURE WOUND 1/2 X4 (GAUZE/BANDAGES/DRESSINGS) ×1
DECANTER SPIKE VIAL GLASS SM (MISCELLANEOUS) ×5 IMPLANT
DISSECT BALLN SPACEMKR + OVL (BALLOONS) ×5
DISSECTOR BALLN SPACEMKR + OVL (BALLOONS) ×3 IMPLANT
DISSECTOR BLUNT TIP ENDO 5MM (MISCELLANEOUS) ×5 IMPLANT
DRSG TEGADERM 2-3/8X2-3/4 SM (GAUZE/BANDAGES/DRESSINGS) ×10 IMPLANT
DRSG TEGADERM 4X4.75 (GAUZE/BANDAGES/DRESSINGS) ×5 IMPLANT
ELECT REM PT RETURN 15FT ADLT (MISCELLANEOUS) ×5 IMPLANT
GAUZE SPONGE 2X2 8PLY STRL LF (GAUZE/BANDAGES/DRESSINGS) ×3 IMPLANT
GLOVE ECLIPSE 8.0 STRL XLNG CF (GLOVE) ×5 IMPLANT
GLOVE INDICATOR 8.0 STRL GRN (GLOVE) ×5 IMPLANT
GOWN STRL REUS W/TWL XL LVL3 (GOWN DISPOSABLE) ×15 IMPLANT
KIT BASIN OR (CUSTOM PROCEDURE TRAY) ×5 IMPLANT
MESH HERNIA 6X6 BARD (Mesh General) IMPLANT
MESH HERNIA BARD 6X6 (Mesh General) ×4 IMPLANT
NDL INSUFFLATION 14GA 120MM (NEEDLE) IMPLANT
NEEDLE INSUFFLATION 14GA 120MM (NEEDLE) IMPLANT
SCISSORS LAP 5X35 DISP (ENDOMECHANICALS) IMPLANT
SET IRRIG TUBING LAPAROSCOPIC (IRRIGATION / IRRIGATOR) IMPLANT
SPONGE GAUZE 2X2 STER 10/PKG (GAUZE/BANDAGES/DRESSINGS) ×2
STRIP CLOSURE SKIN 1/2X4 (GAUZE/BANDAGES/DRESSINGS) ×4 IMPLANT
SUT MNCRL AB 4-0 PS2 18 (SUTURE) ×5 IMPLANT
TACKER 5MM HERNIA 3.5CML NAB (ENDOMECHANICALS) ×2 IMPLANT
TOWEL OR 17X26 10 PK STRL BLUE (TOWEL DISPOSABLE) ×5 IMPLANT
TOWEL OR NON WOVEN STRL DISP B (DISPOSABLE) ×5 IMPLANT
TRAY LAPAROSCOPIC (CUSTOM PROCEDURE TRAY) ×5 IMPLANT
TROCAR CANNULA W/PORT DUAL 5MM (MISCELLANEOUS) ×5 IMPLANT
TUBING INSUF HEATED (TUBING) IMPLANT

## 2017-07-22 NOTE — Anesthesia Procedure Notes (Signed)
Procedure Name: Intubation Date/Time: 07/22/2017 7:45 AM Performed by: Lind Covert Pre-anesthesia Checklist: Patient identified, Emergency Drugs available, Suction available, Patient being monitored and Timeout performed Patient Re-evaluated:Patient Re-evaluated prior to induction Oxygen Delivery Method: Circle system utilized Preoxygenation: Pre-oxygenation with 100% oxygen Induction Type: IV induction Laryngoscope Size: Mac and 4 Grade View: Grade I Tube type: Oral Tube size: 7.5 mm Number of attempts: 1 Airway Equipment and Method: Stylet Placement Confirmation: ETT inserted through vocal cords under direct vision,  positive ETCO2 and breath sounds checked- equal and bilateral Secured at: 22 cm Tube secured with: Tape Dental Injury: Teeth and Oropharynx as per pre-operative assessment

## 2017-07-22 NOTE — Anesthesia Postprocedure Evaluation (Signed)
Anesthesia Post Note  Patient: Mal Asher III  Procedure(s) Performed: Procedure(s) (LRB): LAPAROSCOPIC BILATERAL INGUINAL HERNIA REPAIR WITH MESH (N/A) INSERTION OF MESH (Bilateral) HERNIA REPAIR FEMORAL (Right)     Patient location during evaluation: PACU Anesthesia Type: General Level of consciousness: awake and alert Pain management: pain level controlled Vital Signs Assessment: post-procedure vital signs reviewed and stable Respiratory status: spontaneous breathing, nonlabored ventilation, respiratory function stable and patient connected to nasal cannula oxygen Cardiovascular status: blood pressure returned to baseline and stable Postop Assessment: no apparent nausea or vomiting Anesthetic complications: no    Last Vitals:  Vitals:   07/22/17 1130 07/22/17 1143  BP: 123/70 127/68  Pulse: 74 78  Resp: 14 15  Temp: 36.5 C (!) 36.4 C  SpO2: 94% 95%    Last Pain:  Vitals:   07/22/17 1153  TempSrc:   PainSc: 4                  Niels Cranshaw,JAMES TERRILL

## 2017-07-22 NOTE — Transfer of Care (Signed)
Immediate Anesthesia Transfer of Care Note  Patient: Thomas [name]  Procedure(s) Performed: Procedure(s): LAPAROSCOPIC BILATERAL INGUINAL HERNIA REPAIR WITH MESH (N/A) INSERTION OF MESH (Bilateral) HERNIA REPAIR FEMORAL (Right)  Patient Location: PACU  Anesthesia Type:General  Level of Consciousness: sedated  Airway & Oxygen Therapy: Patient Spontanous Breathing and Patient connected to face mask oxygen  Post-op Assessment: Report given to RN and Post -op Vital signs reviewed and stable  Post vital signs: Reviewed and stable  Last Vitals:  Vitals:   07/22/17 0525  BP: 132/68  Pulse: 77  Resp: 16  Temp: 36.9 C  SpO2: 95%    Last Pain:  Vitals:   07/22/17 0525  TempSrc: Oral      Patients Stated Pain Goal: 4 (16/10/96 0454)  Complications: No apparent anesthesia complications

## 2017-07-22 NOTE — Op Note (Signed)
OPERATIVE NOTE-LAPAROSCOPIC BILATERAL INGUINAL HERNIA REPAIR WITH MESH  PREOP DX:  Bilateral inguinal hernias  POSTOP DX:  Same (direct) with right femoral hernia  PROCEDURE:  Laparoscopic bilateral inguinal hernia and right femoral hernia repair with mesh (Bard polypropylene)  SURGEON:  Jackolyn Confer, M.D.  ANESTHESIA:  General  INDICATION:  This is a 71 year old active male with a symptomatic left inguinal hernia who also has a right inguinal hernia.  He now presents for repair.   TECHNIQUE:  He was seen in the holding area. He voided. He was brought to the operating room, placed supine on the operating table, and a general anesthetic was given.  The hair on the abdominal wall and groin areas was clipped. These areas were then sterilely prepped and draped. A timeout was performed.  Marcaine was infiltrated in the subumbilical region. A transverse subumbilical incision was made through the skin and subcutaneous tissue. A small incision was made in the  left anterior rectus sheath. The rectus muscle was swept laterally exposing the posterior rectus sheath. A balloon dissection device was then placed into the extraperitoneal space.  Under laparoscopic vision, balloon dissection was performed of the subumbilical extra peritoneal space. The balloon was then removed and CO2 gas insufflated.  Two 5 mm trocars were placed in the lower midline.  Using blunt dissection, the symphysis pubis was identified. Cooper's ligament was identified bilaterally.    Beginning on the left side, extraperitoneal tissues were dissected free from the anterior and lateral abdominal wall. The spermatic cord was isolated and a window created around it.  A direct hernia was noted.  The hernia sac and contents were then reduced. Peritoneum was stripped back on the spermatic cord.  The right side was then approached and extraperitoneal tissues were dissected free from the anterior and lateral abdominal wall. The spermatic cord  was isolated and a window created around it. A direct and femoral hernia were noted.  The hernia sacs and contents were then reduced. Peritoneum was stripped back on the spermatic cord.      A piece of 5" x 6" mesh with a partial longitudinal slit in it was then placed into the right extraperitoneal space.  It was then anchored to Cooper's ligament, the anterior abdominal wall, and the lateral abdominal wall with spiral tacks. This provided adequate coverage with good overlap of the direct, indirect, and femoral spaces.  Next, the left side was approached.  A piece of 5" x 6" mesh with a partial longitudinal slit in it was then placed into this extraperitoneal space.  It was then anchored to Cooper's ligament, the anterior abdominal wall, and the lateral abdominal wall with spiral tacks. This provided adequate coverage with good overlap of the direct, indirect, and femoral spaces.  The extraperitoneal space was then inspected. There is no evidence of organ injury. Hemostasis was adequate. The inferolateral aspect of each piece of mesh was then held down and the CO2 gas release. The extraperitoneal contents were observed approximating the mesh. All trocars were then removed.  The left anterior rectus sheath defect was closed with interrupted 0 Vicryl sutures.  Skin incisions were closed with 4-0 Monocryl subcuticular stitches. Steri-Strips and sterile dressings were applied.  He tolerated the procedure well without any apparent complications. He was taken to the recovery room in satisfactory condition. Estimated blood loss was approximately 50 cc.

## 2017-07-22 NOTE — Discharge Instructions (Addendum)
General Anesthesia, Adult, Care After These instructions provide you with information about caring for yourself after your procedure. Your health care provider may also give you more specific instructions. Your treatment has been planned according to current medical practices, but problems sometimes occur. Call your health care provider if you have any problems or questions after your procedure. What can I expect after the procedure? After the procedure, it is common to have:  Vomiting.  A sore throat.  Mental slowness.  It is common to feel:  Nauseous.  Cold or shivery.  Sleepy.  Tired.  Sore or achy, even in parts of your body where you did not have surgery.  Follow these instructions at home: For at least 24 hours after the procedure:  Do not: ? Participate in activities where you could fall or become injured. ? Drive. ? Use heavy machinery. ? Drink alcohol. ? Take sleeping pills or medicines that cause drowsiness. ? Make important decisions or sign legal documents. ? Take care of children on your own.  Rest. Eating and drinking  If you vomit, drink water, juice, or soup when you can drink without vomiting.  Drink enough fluid to keep your urine clear or pale yellow.  Make sure you have little or no nausea before eating solid foods.  Follow the diet recommended by your health care provider. General instructions  Have a responsible adult stay with you until you are awake and alert.  Return to your normal activities as told by your health care provider. Ask your health care provider what activities are safe for you.  Take over-the-counter and prescription medicines only as told by your health care provider.  If you smoke, do not smoke without supervision.  Keep all follow-up visits as told by your health care provider. This is important. Contact a health care provider if:  You continue to have nausea or vomiting at home, and medicines are not helpful.  You  cannot drink fluids or start eating again.  You cannot urinate after 8-12 hours.  You develop a skin rash.  You have fever.  You have increasing redness at the site of your procedure. Get help right away if:  You have difficulty breathing.  You have chest pain.  You have unexpected bleeding.  You feel that you are having a life-threatening or urgent problem. This information is not intended to replace advice given to you by your health care provider. Make sure you discuss any questions you have with your health care provider. Document Released: 01/28/2001 Document Revised: 03/26/2016 Document Reviewed: 10/06/2015 Elsevier Interactive Patient Education  2018 [place] _______[facility]   INGUINAL HERNIA REPAIR: POST OP INSTRUCTIONS  Always review your discharge instruction sheet given to you by the facility where your surgery was performed. IF YOU HAVE DISABILITY OR FAMILY LEAVE FORMS, YOU MUST BRING THEM TO THE OFFICE FOR PROCESSING.   DO NOT GIVE THEM TO YOUR DOCTOR.  1. A  prescription for pain medication may be given to you upon discharge.  Take your pain medication as prescribed, if needed.  If narcotic pain medicine is not needed, then you may take acetaminophen (Tylenol) or ibuprofen (Advil) as needed.  The keys to pain control are the following:  Do not lie flat for 3 days; apply ice to the area(s) aggressively for 4 days; take Tylenol and Ibuprofen if you can; use prescription pain medication as needed.  It may be quite painful for 3-7 days. 2. Take your usually prescribed medications  unless otherwise directed. 3. If you need a refill on your pain medication, please contact your pharmacy.  They will contact our office to request authorization. Prescriptions will not be filled after 5 pm or on week-ends. 4. You should follow a light diet the first 24 hours after arrival home, such as soup and crackers, etc.  Be sure to include lots of fluids daily.   Resume your normal diet the day after surgery. 5. Most patients will experience some swelling and bruising in the groin area (and scrotum in men).  Ice packs and reclining will help.  Swelling and bruising can take many days to resolve.  6. It is common to experience some constipation if taking pain medication after surgery.  Increasing fluid intake and taking a stool softener (such as Colace) will usually help or prevent this problem from occurring.  A mild laxative (Milk of Magnesia or Miralax) should be taken according to package directions if there are no bowel movements after 48 hours. 7. Unless discharge instructions indicate otherwise, you may remove your bandages 4 days after surgery, and you may shower at that time.  You may have steri-strips (small skin tapes) in place directly over the incision.  These strips should be left on the skin.  If your surgeon used skin glue on the incision, you may shower in 24 hours.  The glue will flake off over the next 2-3 weeks.  Any sutures or staples will be removed at the office during your follow-up visit. 8. ACTIVITIES:  You may resume regular (light) daily activities beginning the next day--such as daily self-care, walking, climbing stairs--gradually increasing activities as tolerated.  You may have sexual intercourse when it is comfortable.  Refrain from any heavy lifting or straining-nothing over 10 pounds for 6 weeks.  Do note lie flat for 2-3 days. a. You may drive when you are no longer taking prescription pain medication, you can comfortably wear a seatbelt, and you can safely maneuver your car and apply brakes. b. RETURN TO WORK:  Desk work/Light work in 1-2 weeks, full duty in 6 weeks._________________________________________________________ 9. You should see your doctor in the office for a follow-up appointment approximately 2-3 weeks after your surgery.  Make sure that you call for this appointment within a day or two after you arrive home to insure  a convenient appointment time. 10. OTHER INSTRUCTIONS:  ___Restart Aspirin 9/20/18_______________________________________________________________________________________________________________________________________________________________________________________  WHEN TO CALL YOUR DOCTOR: 1. Fever over 101.0 2. Inability to urinate 3. Nausea and/or vomiting 4. Extreme swelling or bruising 5. Continued bleeding from incision. 6. Increased pain, redness, or drainage from the incision  The clinic staff is available to answer your questions during regular business hours.  Please dont hesitate to call and ask to speak to one of the nurses for clinical concerns.  If you have a medical emergency, go to the nearest emergency room or call 911.  A surgeon from [facility] is always on call at the hospital   [address] [place], [place], [place]  [address] ?  [address], [place], [place]   [address] [phone] ? [phone] ? FAX [phone] Web site: [email or web address]

## 2017-07-22 NOTE — H&P (Signed)
H & P  Mr. Hammerschmidt was referred by Dr. Sheryn Bison (initially to Dr. Harlow Asa) for consultation regarding a left hernia. He was seeing Dr. Sheryn Bison because of some periumbilical abdominal pain. He had also noted a bulge in the left inguinal area. The bulge in the left inguinal area gets larger when he stands up. No difficulty with urination or constipation. He does have prostate cancer which is low-grade and is being followed closely. He initially saw Dr. Harlow Asa who diagnosed him with a small to moderate size right inguinal hernia and suggested consideration of laparoscopic repair. Dr. Harlow Asa thus asked me to see him and discussed laparoscopic repair with him. He has a heart murmur due to MVR. He has been seen by cardiology and cleared to undergo hernia repair  Past Surgical History Tonsillectomy  Vasectomy    Allergies  No Known Allergies  Allergies Reconciled   Prior to Admission medications   Medication Sig Start Date End Date Taking? Authorizing Provider  aspirin 81 MG tablet Take 81 mg by mouth daily.   Yes [provider]  Multiple Vitamin (MULTIVITAMIN) tablet Take 1 tablet by mouth daily.   Yes [provider]  Vitamin D, Ergocalciferol, 2000 units CAPS Take 2,000 Units by mouth daily.   Yes [provider]     Social History  Alcohol use  Occasional alcohol use. Caffeine use  Tea. No drug use  Tobacco use  Never smoker.  Family History  Diabetes Mellitus  Father. Heart Disease  Mother. Respiratory Condition  Father.  Other Problems  Enlarged Prostate  Heart murmur-MVR Prostate Cancer    Physical Exam  The physical exam findings are as follows: Note:GENERAL APPEARANCE: WDWN in NAD. Pleasant and cooperative.  EARS, NOSE, MOUTH THROAT: Roberts/AT external ears: no lesions or deformities external nose: no lesions or deformities hearing: grossly normal lips: moist, no deformities EYES external: conjunctiva, lids, sclerae  normal pupils: equal, round glasses: no/yes  NECK: Supple, no obvious mass or thyroid mass/enlargement, no trachea deviation  CV ascultation: RRR, no murmur extremity edema: no extremity varicosities: no  RESP/CHEST auscultation: breath sounds equal and clear respiratory effort: normal  GASTROINTESTINAL abdomen: Soft, non-tender, non-distended, no masses liver and spleen: not enlarged. hernia: Moderate-size to large reducible left inguinal hernia, small to moderate-size reducible right inguinal hernia. scar: none present  GENITOURINARY scrotum: no masses penis: no lesions  MUSCULOSKELETAL station and gait: normal digits/nails: no clubbing or cyanosis muscle strength: grossly normal all extremities deformities: none instability: none  SKIN jaundice: none rash or lesion: none  NEUROLOGIC speech: normal  PSYCHIATRIC alertness and orientation: normal mood/affect/behavior: normal judgement and insight: normal    Assessment & PlanBILATERAL INGUINAL HERNIA WITH GANGRENE, RECURRENCE NOT SPECIFIED (K40.10) Impression: Left side is moderate to large, right side is moderate. Both are reducible. He is interested in repair. I feel he is a good candidate for laparoscopic repair. I am not sure this is etiology of his periumbilical abdominal pain. He has been referred to gastroenterology for further evaluation of this.  Plan: Laparoscopic bilateral inguinal hernia repair with mesh. I have explained the procedure, risks, and aftercare of inguinal hernia repair. Risks include but are not limited to bleeding, infection, wound problems, anesthesia, recurrence, bladder or intestine injury, urinary retention, testicular dysfunction, chronic pain, mesh problems. He seems to understand and agrees with the plan.  Jackolyn Confer, M.D.

## 2017-07-22 NOTE — Progress Notes (Signed)
Pt in w/c to be d.c home . The pt's ride (frank) was not at the main entrance.at [facility] arrived at 1315... Dc .Instructions reviewed with frank.

## 2017-09-19 DIAGNOSIS — C61 Malignant neoplasm of prostate: Secondary | ICD-10-CM | POA: Diagnosis not present

## 2017-09-20 ENCOUNTER — Other Ambulatory Visit: Payer: Self-pay | Admitting: General Surgery

## 2017-09-20 DIAGNOSIS — Z8719 Personal history of other diseases of the digestive system: Secondary | ICD-10-CM

## 2017-09-20 DIAGNOSIS — Z9889 Other specified postprocedural states: Principal | ICD-10-CM

## 2017-10-01 ENCOUNTER — Ambulatory Visit
Admission: RE | Admit: 2017-10-01 | Discharge: 2017-10-01 | Disposition: A | Discharge status: Home / Self Care | Payer: PPO | Source: Ambulatory Visit | Attending: General Surgery | Admitting: General Surgery

## 2017-10-01 DIAGNOSIS — Z8719 Personal history of other diseases of the digestive system: Secondary | ICD-10-CM

## 2017-10-01 DIAGNOSIS — Z9889 Other specified postprocedural states: Principal | ICD-10-CM

## 2017-10-01 DIAGNOSIS — R1909 Other intra-abdominal and pelvic swelling, mass and lump: Secondary | ICD-10-CM | POA: Diagnosis not present

## 2017-10-01 MED ORDER — IOPAMIDOL (ISOVUE-300) INJECTION 61%
100.0000 mL | Freq: Once | INTRAVENOUS | Status: AC | PRN
  Administered 2017-10-01: 100 mL via INTRAVENOUS

## 2017-10-04 DIAGNOSIS — C61 Malignant neoplasm of prostate: Secondary | ICD-10-CM | POA: Diagnosis not present

## 2017-12-06 DIAGNOSIS — C61 Malignant neoplasm of prostate: Secondary | ICD-10-CM | POA: Diagnosis not present

## 2017-12-18 DIAGNOSIS — L570 Actinic keratosis: Secondary | ICD-10-CM | POA: Diagnosis not present

## 2017-12-18 DIAGNOSIS — X32XXXD Exposure to sunlight, subsequent encounter: Secondary | ICD-10-CM | POA: Diagnosis not present

## 2018-04-02 DIAGNOSIS — C44329 Squamous cell carcinoma of skin of other parts of face: Secondary | ICD-10-CM | POA: Diagnosis not present

## 2018-04-30 DIAGNOSIS — D2339 Other benign neoplasm of skin of other parts of face: Secondary | ICD-10-CM | POA: Diagnosis not present

## 2018-04-30 DIAGNOSIS — C44329 Squamous cell carcinoma of skin of other parts of face: Secondary | ICD-10-CM | POA: Diagnosis not present

## 2018-06-02 ENCOUNTER — Ambulatory Visit: Payer: PPO | Admitting: Cardiology

## 2018-06-02 ENCOUNTER — Encounter: Payer: Self-pay | Admitting: Cardiology

## 2018-06-02 ENCOUNTER — Encounter (INDEPENDENT_AMBULATORY_CARE_PROVIDER_SITE_OTHER): Payer: Self-pay

## 2018-06-02 VITALS — BP 120/72 | HR 63 | Ht 70.0 in | Wt 181.0 lb

## 2018-06-02 DIAGNOSIS — I340 Nonrheumatic mitral (valve) insufficiency: Secondary | ICD-10-CM | POA: Diagnosis not present

## 2018-06-02 NOTE — Progress Notes (Signed)
[place]. [address],   [address] Phone: [phone] Fax:  [phone]  Date:  06/02/2018   ID:  Thomas [name], DOB 01/09/1946, MRN [id number]  PCP:  [name], MD   History of Present Illness: Thomas [name] is a 72 y.o. male here with mitral valve prolapse (posterior leaflet), mild to moderate mitral regurgitation from echocardiogram of 07/25/2012 - stable on 2018 (heart murmur). Prior [employer]. Had a friend who unfortunately had complications after aortic valve surgery.  He has been enjoying retirement. Every 7-8 weeks he will drive up to the [facility] and spent time with his friend.  06/02/2018-overall doing very well no chest pain fevers chills nausea vomiting syncope shortness of breath orthopnea.  Stable from valve perspective.  Did well with surgery, Dr. Zella Richer.  Wt Readings from Last 3 Encounters:  06/02/18 181 lb (82.1 kg)  07/22/17 179 lb (81.2 kg)  07/16/17 179 lb 3.2 oz (81.3 kg)     Past Medical History:  Diagnosis Date  . Bilateral inguinal hernia   . BPH (benign prostatic hyperplasia)   . Cancer [facility])    prostate cancer; per patient being followed by Dr [name] at [facility] urology ; currently no chemotherapy   . Mitral valve prolapse    now seeing [name]     Past Surgical History:  Procedure Laterality Date  . FEMORAL HERNIA REPAIR Right 07/22/2017   Procedure: HERNIA REPAIR FEMORAL;  Surgeon: Jackolyn Confer, MD;  Location: [facility];  Service: General;  Laterality: Right;  . INGUINAL HERNIA REPAIR N/A 07/22/2017   Procedure: LAPAROSCOPIC BILATERAL INGUINAL HERNIA REPAIR WITH MESH;  Surgeon: Jackolyn Confer, MD;  Location: [facility];  Service: General;  Laterality: N/A;  . INSERTION OF MESH Bilateral 07/22/2017   Procedure: INSERTION OF MESH;  Surgeon: Jackolyn Confer, MD;  Location: [facility];  Service: General;  Laterality: Bilateral;  . TONSILLECTOMY AND ADENOIDECTOMY       Current Outpatient Medications  Medication Sig Dispense Refill  . Calcium Carbonate (CALCIUM 600 PO) Take 1 capsule by mouth 2 (two) times daily.    . Multiple Vitamin (MULTIVITAMIN) tablet Take 1 tablet by mouth daily.    . Vitamin D, Ergocalciferol, 2000 units CAPS Take 2,000 Units by mouth daily.     No current facility-administered medications for this visit.     Allergies:   No Known Allergies  Social History:  The patient  reports that he has never smoked. He has never used smokeless tobacco. He reports that he drinks alcohol. He reports that he does not use drugs.   History reviewed. No pertinent family history.  ROS:  Please see the history of present illness.     PHYSICAL EXAM: VS:  BP 120/72   Pulse 63   Ht 5\' 10"  (1.778 m)   Wt 181 lb (82.1 kg)   BMI 25.97 kg/m  GEN: Well nourished, well developed, in no acute distress  HEENT: normal  Neck: no JVD, carotid bruits, or masses Cardiac: RRR; 3/6 HSM apex, rubs, or gallops,no edema  Respiratory:  clear to auscultation bilaterally, normal work of breathing GI: soft, nontender, nondistended, + BS MS: no deformity or atrophy  Skin: warm and dry, no rash Neuro:  Alert and Oriented x 3, Strength and sensation are intact Psych: euthymic mood, full affect   EKG: Today 06/02/2018-sinus rhythm 63 with no other abnormalities personally viewed-prior 05/28/17-soft normal sinus rhythm 68 with  no other abnormalities.  07/14/14-sinus rhythm, heart rate 65, slightly peaked T-wave in V3, V4, no other significant abnormalities.     Echo: 07/19/14  - Left ventricle: The cavity size was normal. Wall thickness was normal. Systolic function was normal. The estimated ejection fraction was in the range of 60% to 65%. Features are consistent with a pseudonormal left ventricular filling pattern, with concomitant abnormal relaxation and increased filling pressure (grade 2 diastolic dysfunction). - Aortic valve: There was trivial  regurgitation. - Mitral valve: There was mild regurgitation. - Left atrium: The atrium was moderately to severely dilated. - Pulmonic valve: There was moderate regurgitation.   05/29/2017:  - LVEF 65-70%, mild LVH, normal wall motion, grade 1 DD,   indeterminate LV filling pressure, late systolic bileaflet   prolpase with mild to moderate regurgitation, severe LAE, upper   normal RA size, trivial TR, RVSP 28 mmHg, normal IVC.  ASSESSMENT AND PLAN:   1. Mitral valve regurgitation - mild to moderate with posterior leaflet prolapse.  We discussed mitral valve repair in the setting of severe mitral regurgitation.  Overall has been very stable.  Feeling well.  Counseled him that if he has significant shortness of breath or any change in symptoms to let me know immediately.  Dilated left atrium noted.  Signed, Candee Furbish, MD [facility]  06/02/2018 9:10 AM

## 2018-06-02 NOTE — Patient Instructions (Signed)
Medication Instructions: No changes   Labwork: None ordered    Testing/Procedures: None ordered    Follow-Up: Your physician recommends that you schedule a follow-up appointment in: One year with Dr.  [name]        Any Other Special Instructions Will Be Listed Below (If Applicable).     If you need a refill on your cardiac medications before your next appointment, please call your pharmacy.

## 2018-06-06 DIAGNOSIS — C61 Malignant neoplasm of prostate: Secondary | ICD-10-CM | POA: Diagnosis not present

## 2018-06-20 DIAGNOSIS — C61 Malignant neoplasm of prostate: Secondary | ICD-10-CM | POA: Diagnosis not present

## 2018-07-02 DIAGNOSIS — Z08 Encounter for follow-up examination after completed treatment for malignant neoplasm: Secondary | ICD-10-CM | POA: Diagnosis not present

## 2018-07-02 DIAGNOSIS — L570 Actinic keratosis: Secondary | ICD-10-CM | POA: Diagnosis not present

## 2018-07-02 DIAGNOSIS — Z85828 Personal history of other malignant neoplasm of skin: Secondary | ICD-10-CM | POA: Diagnosis not present

## 2018-07-02 DIAGNOSIS — X32XXXD Exposure to sunlight, subsequent encounter: Secondary | ICD-10-CM | POA: Diagnosis not present

## 2018-08-01 DIAGNOSIS — E785 Hyperlipidemia, unspecified: Secondary | ICD-10-CM | POA: Diagnosis not present

## 2018-08-01 DIAGNOSIS — Z23 Encounter for immunization: Secondary | ICD-10-CM | POA: Diagnosis not present

## 2018-08-01 DIAGNOSIS — C61 Malignant neoplasm of prostate: Secondary | ICD-10-CM | POA: Diagnosis not present

## 2018-08-01 DIAGNOSIS — Z0000 Encounter for general adult medical examination without abnormal findings: Secondary | ICD-10-CM | POA: Diagnosis not present

## 2018-08-01 DIAGNOSIS — I340 Nonrheumatic mitral (valve) insufficiency: Secondary | ICD-10-CM | POA: Diagnosis not present

## 2018-08-07 DIAGNOSIS — D7589 Other specified diseases of blood and blood-forming organs: Secondary | ICD-10-CM | POA: Diagnosis not present

## 2018-11-25 DIAGNOSIS — H52223 Regular astigmatism, bilateral: Secondary | ICD-10-CM | POA: Diagnosis not present

## 2018-11-25 DIAGNOSIS — H524 Presbyopia: Secondary | ICD-10-CM | POA: Diagnosis not present

## 2018-11-25 DIAGNOSIS — H2513 Age-related nuclear cataract, bilateral: Secondary | ICD-10-CM | POA: Diagnosis not present

## 2018-11-25 DIAGNOSIS — H5201 Hypermetropia, right eye: Secondary | ICD-10-CM | POA: Diagnosis not present

## 2019-01-30 DIAGNOSIS — C61 Malignant neoplasm of prostate: Secondary | ICD-10-CM | POA: Diagnosis not present

## 2019-02-04 DIAGNOSIS — L570 Actinic keratosis: Secondary | ICD-10-CM | POA: Diagnosis not present

## 2019-02-04 DIAGNOSIS — X32XXXD Exposure to sunlight, subsequent encounter: Secondary | ICD-10-CM | POA: Diagnosis not present

## 2019-02-04 DIAGNOSIS — Z0000 Encounter for general adult medical examination without abnormal findings: Secondary | ICD-10-CM | POA: Diagnosis not present

## 2019-02-04 DIAGNOSIS — L821 Other seborrheic keratosis: Secondary | ICD-10-CM | POA: Diagnosis not present

## 2019-02-10 DIAGNOSIS — Z930 Tracheostomy status: Secondary | ICD-10-CM | POA: Diagnosis not present

## 2019-02-10 DIAGNOSIS — I10 Essential (primary) hypertension: Secondary | ICD-10-CM | POA: Diagnosis not present

## 2019-02-10 DIAGNOSIS — J9620 Acute and chronic respiratory failure, unspecified whether with hypoxia or hypercapnia: Secondary | ICD-10-CM | POA: Diagnosis not present

## 2019-02-10 DIAGNOSIS — C61 Malignant neoplasm of prostate: Secondary | ICD-10-CM | POA: Diagnosis not present

## 2019-02-10 DIAGNOSIS — Z79899 Other long term (current) drug therapy: Secondary | ICD-10-CM | POA: Diagnosis not present

## 2019-06-18 ENCOUNTER — Telehealth: Payer: Self-pay

## 2019-06-18 NOTE — Telephone Encounter (Signed)
Medications have been reviewed with pt. Pt has given verbal consent for a phone visit. Pt will have weight ready prior to visit.   YOUR CARDIOLOGY TEAM HAS ARRANGED FOR AN E-VISIT FOR YOUR APPOINTMENT - PLEASE REVIEW IMPORTANT INFORMATION BELOW SEVERAL DAYS PRIOR TO YOUR APPOINTMENT  Due to the recent COVID-19 pandemic, we are transitioning in-person office visits to tele-medicine visits in an effort to decrease unnecessary exposure to our patients, their families, and staff. These visits are billed to your insurance just like a normal visit is. We also encourage you to sign up for MyChart if you have not already done so. You will need a smartphone if possible. For patients that do not have this, we can still complete the visit using a regular telephone but do prefer a smartphone to enable video when possible. You may have a family member that lives with you that can help. If possible, we also ask that you have a blood pressure cuff and scale at home to measure your blood pressure, heart rate and weight prior to your scheduled appointment. Patients with clinical needs that need an in-person evaluation and testing will still be able to come to the office if absolutely necessary. If you have any questions, feel free to call our office.     YOUR PROVIDER WILL BE USING THE FOLLOWING PLATFORM TO COMPLETE YOUR VISIT: [email or web address] . IF USING MYCHART - How to Download the MyChart App to Your SmartPhone   - If Apple, go to [employer] and type in MyChart in the search bar and download the app. If Android, ask patient to go to [employer] and type in [place] in the search bar and download the app. The app is free but as with any other app downloads, your phone may require you to verify saved payment information or Apple/Android password.  - You will need to then log into the app with your MyChart username and password, and select [facility] as your healthcare provider to link the account.  - When it is time for  your visit, go to the MyChart app, find appointments, and click Begin Video Visit. Be sure to Select Allow for your device to access the Microphone and Camera for your visit. You will then be connected, and your provider will be with you shortly.  **If you have any issues connecting or need assistance, please contact MyChart service desk [phone])**  **If using a computer, in order to ensure the best quality for your visit, you will need to use either of the following Internet Browsers: Insurance underwriter or [employer]**  . IF USING DOXIMITY or [email or web address].ME - The staff will give you instructions on receiving your link to join the meeting the day of your visit.      2-3 DAYS BEFORE YOUR APPOINTMENT  You will receive a telephone call from one of our [place] team members - your caller ID may say "Unknown caller." If this is a video visit, we will walk you through how to get the video launched on your phone. We will remind you check your blood pressure, heart rate and weight prior to your scheduled appointment. If you have an Apple Watch or Kardia, please upload any pertinent ECG strips the day before or morning of your appointment to [place]. Our staff will also make sure you have reviewed the consent and agree to move forward with your scheduled tele-health visit.     THE DAY OF YOUR APPOINTMENT  Approximately 15 minutes prior  to your scheduled appointment, you will receive a telephone call from one of [facility] team - your caller ID may say "Unknown caller."  Our staff will confirm medications, vital signs for the day and any symptoms you may be experiencing. Please have this information available prior to the time of visit start. It may also be helpful for you to have a pad of paper and pen handy for any instructions given during your visit. They will also walk you through joining the smartphone meeting if this is a video visit.    CONSENT FOR TELE-HEALTH VISIT - PLEASE  REVIEW  I hereby voluntarily request, consent and authorize [facility] and its employed or contracted physicians, physician assistants, nurse practitioners or other licensed health care professionals (the Practitioner), to provide me with telemedicine health care services (the "Services") as deemed necessary by the treating Practitioner. I acknowledge and consent to receive the Services by the Practitioner via telemedicine. I understand that the telemedicine visit will involve communicating with the Practitioner through live audiovisual communication technology and the disclosure of certain medical information by electronic transmission. I acknowledge that I have been given the opportunity to request an in-person assessment or other available alternative prior to the telemedicine visit and am voluntarily participating in the telemedicine visit.  I understand that I have the right to withhold or withdraw my consent to the use of telemedicine in the course of my care at any time, without affecting my right to future care or treatment, and that the Practitioner or I may terminate the telemedicine visit at any time. I understand that I have the right to inspect all information obtained and/or recorded in the course of the telemedicine visit and may receive copies of available information for a reasonable fee.  I understand that some of the potential risks of receiving the Services via telemedicine include:  Marland Kitchen Delay or interruption in medical evaluation due to technological equipment failure or disruption; . Information transmitted may not be sufficient (e.g. poor resolution of images) to allow for appropriate medical decision making by the Practitioner; and/or  . In rare instances, security protocols could fail, causing a breach of personal health information.  Furthermore, I acknowledge that it is my responsibility to provide information about my medical history, conditions and care that is complete and  accurate to the best of my ability. I acknowledge that Practitioner's advice, recommendations, and/or decision may be based on factors not within their control, such as incomplete or inaccurate data provided by me or distortions of diagnostic images or specimens that may result from electronic transmissions. I understand that the practice of medicine is not an exact science and that Practitioner makes no warranties or guarantees regarding treatment outcomes. I acknowledge that I will receive a copy of this consent concurrently upon execution via email to the email address I last provided but may also request a printed copy by calling the office of [place].    I understand that my insurance will be billed for this visit.   I have read or had this consent read to me. . I understand the contents of this consent, which adequately explains the benefits and risks of the Services being provided via telemedicine.  . I have been provided ample opportunity to ask questions regarding this consent and the Services and have had my questions answered to my satisfaction. . I give my informed consent for the services to be provided through the use of telemedicine in my medical care  By participating  in this telemedicine visit I agree to the above.

## 2019-06-22 ENCOUNTER — Telehealth (INDEPENDENT_AMBULATORY_CARE_PROVIDER_SITE_OTHER): Payer: PPO | Admitting: Cardiology

## 2019-06-22 ENCOUNTER — Other Ambulatory Visit: Payer: Self-pay

## 2019-06-22 VITALS — HR 67 | Ht 70.0 in | Wt 178.0 lb

## 2019-06-22 DIAGNOSIS — I340 Nonrheumatic mitral (valve) insufficiency: Secondary | ICD-10-CM

## 2019-06-22 NOTE — Progress Notes (Signed)
Virtual Visit via Telephone Note   This visit type was conducted due to national recommendations for restrictions regarding the COVID-19 Pandemic (e.g. social distancing) in an effort to limit this patient's exposure and mitigate transmission in our community.  Due to his co-morbid illnesses, this patient is at least at moderate risk for complications without adequate follow up.  This format is felt to be most appropriate for this patient at this time.  The patient did not have access to video technology/had technical difficulties with video requiring transitioning to audio format only (telephone).  All issues noted in this document were discussed and addressed.  No physical exam could be performed with this format.  Please refer to the patient's chart for his  consent to telehealth for [facility].   Date:  06/22/2019   ID:  Thomas [name], DOB 12-Dec-1945, MRN [id number]  Patient Location: Home Provider Location: Home  PCP:  [name], MD  Cardiologist:  No primary care provider on file.  Electrophysiologist:  None   Evaluation Performed:  Follow-Up Visit  Chief Complaint: Follow-up of mitral regurgitation  History of Present Illness:    Thomas [name] is a 73 y.o. male with mitral valve prolapse (posterior leaflet), mild to moderate mitral regurgitation from echocardiogram of 07/25/2012 - stable on 2018 (heart murmur). Prior [employer]. Had a friend who unfortunately had complications after aortic valve surgery.   He has been enjoying retirement. Every 7-8 weeks he will drive up to the [place] and spent time with his friend who came back with him with 2 Jasmine Awe, 1 of his own.  No physical issues. Found 3 hernias. Had pain in stomach for years. Now has no issues. Great  No SOB, no CP, no syncope, no bleeding.   Masks  The patient does not have symptoms concerning for COVID-19 infection (fever, chills, cough, or new  shortness of breath).    Past Medical History:  Diagnosis Date  . Bilateral inguinal hernia   . BPH (benign prostatic hyperplasia)   . Cancer [facility])    prostate cancer; per patient being followed by Dr [name] at [facility] urology ; currently no chemotherapy   . Mitral valve prolapse    now seeing [name]    Past Surgical History:  Procedure Laterality Date  . FEMORAL HERNIA REPAIR Right 07/22/2017   Procedure: HERNIA REPAIR FEMORAL;  Surgeon: Jackolyn Confer, MD;  Location: [facility];  Service: General;  Laterality: Right;  . INGUINAL HERNIA REPAIR N/A 07/22/2017   Procedure: LAPAROSCOPIC BILATERAL INGUINAL HERNIA REPAIR WITH MESH;  Surgeon: Jackolyn Confer, MD;  Location: [facility];  Service: General;  Laterality: N/A;  . INSERTION OF MESH Bilateral 07/22/2017   Procedure: INSERTION OF MESH;  Surgeon: Jackolyn Confer, MD;  Location: [facility];  Service: General;  Laterality: Bilateral;  . TONSILLECTOMY AND ADENOIDECTOMY       Current Meds  Medication Sig  . Calcium Carbonate (CALCIUM 600 PO) Take 1 capsule by mouth 2 (two) times daily.  . Multiple Vitamin (MULTIVITAMIN) tablet Take 1 tablet by mouth daily.  . Vitamin D, Ergocalciferol, 2000 units CAPS Take 2,000 Units by mouth daily.     Allergies:   Patient has no known allergies.   Social History   Tobacco Use  . Smoking status: Never Smoker  . Smokeless tobacco: Never Used  Substance Use Topics  . Alcohol use: Yes    Comment: seldom   . Drug use: No  Family Hx: The patient's family history is not on file. No early CAD histroy  ROS:   Please see the history of present illness.     All other systems reviewed and are negative.   Prior CV studies:   The following studies were reviewed today:  Echo: 07/19/14  - Left ventricle: The cavity size was normal. Wall thickness was   normal. Systolic function was normal. The estimated ejection   fraction was in the range of 60% to 65%. Features are consistent   with a  pseudonormal left ventricular filling pattern, with   concomitant abnormal relaxation and increased filling pressure   (grade 2 diastolic dysfunction). - Aortic valve: There was trivial regurgitation. - Mitral valve: There was mild regurgitation. - Left atrium: The atrium was moderately to severely dilated. - Pulmonic valve: There was moderate regurgitation.    05/29/2017:   - LVEF 65-70%, mild LVH, normal wall motion, grade 1 DD,   indeterminate LV filling pressure, late systolic bileaflet   prolpase with mild to moderate regurgitation, severe LAE, upper   normal RA size, trivial TR, RVSP 28 mmHg, normal IVC.     Labs/Other Tests and Data Reviewed:    EKG:  As below  06/02/2018-sinus rhythm 63 with no other abnormalities personally viewed-prior 05/28/17-soft normal sinus rhythm 68 with no other abnormalities.  07/14/14-sinus rhythm, heart rate 65, slightly peaked T-wave in V3, V4, no other significant abnormalities.        Recent Labs: No results found for requested labs within last 8760 hours.   Recent Lipid Panel No results found for: CHOL, TRIG, HDL, CHOLHDL, LDLCALC, LDLDIRECT  Wt Readings from Last 3 Encounters:  06/22/19 178 lb (80.7 kg)  06/02/18 181 lb (82.1 kg)  07/22/17 179 lb (81.2 kg)     Objective:    Vital Signs:  Pulse 67   Ht 5\' 10"  (1.778 m)   Wt 178 lb (80.7 kg)   SpO2 98%   BMI 25.54 kg/m    VITAL SIGNS:  reviewed able to complete full sentences without difficulty.  Alert.  Normal respiratory effort.  ASSESSMENT & PLAN:    Mitral valve regurgitation - Echocardiogram 2018 shows mild to moderate mitral regurgitation with left atrial enlargement and bilateral leaflet prolapse previously described as mild to moderate. - We once again recounted rationale for surgery.  Continue with surveillance.  Watch for any change in symptoms such as abrupt shortness of breath.  Will check ECHO.   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   COVID-19 Education: The signs and symptoms of COVID-19 were discussed with the patient and how to seek care for testing (follow up with PCP or arrange E-visit).  The importance of social distancing was discussed today.  Time:   Today, I have spent 15 minutes with the patient with telehealth technology discussing the above problems.     Medication Adjustments/Labs and Tests Ordered: Current medicines are reviewed at length with the patient today.  Concerns regarding medicines are outlined above.   Tests Ordered: No orders of the defined types were placed in this encounter.   Medication Changes: No orders of the defined types were placed in this encounter.   Follow Up:  Virtual Visit or In Person in 1 year(s)  Signed, Candee Furbish, MD  06/22/2019 8:06 AM    [place]

## 2019-06-22 NOTE — Patient Instructions (Signed)
Medication Instructions:  The current medical regimen is effective;  continue present plan and medications.  If you need a refill on your cardiac medications before your next appointment, please call your pharmacy.   Testing/Procedures: Your physician has requested that you have an echocardiogram. Echocardiography is a painless test that uses sound waves to create images of your heart. It provides your doctor with information about the size and shape of your heart and how well your heart's chambers and valves are working. This procedure takes approximately one hour. There are no restrictions for this procedure.  You will be contacted to be scheduled for this testing.  Follow-Up: Follow up in 1 year with Dr. [name].  You will receive a letter in the mail 2 months before you are due.  Please call us when you receive this letter to schedule your follow up appointment.  Thank you for choosing [facility]!!

## 2019-07-03 ENCOUNTER — Other Ambulatory Visit: Payer: Self-pay

## 2019-07-03 ENCOUNTER — Ambulatory Visit (HOSPITAL_COMMUNITY): Payer: PPO | Attending: Cardiology

## 2019-07-03 DIAGNOSIS — I340 Nonrheumatic mitral (valve) insufficiency: Secondary | ICD-10-CM | POA: Insufficient documentation

## 2019-09-01 DIAGNOSIS — C61 Malignant neoplasm of prostate: Secondary | ICD-10-CM | POA: Diagnosis not present

## 2019-09-08 DIAGNOSIS — C61 Malignant neoplasm of prostate: Secondary | ICD-10-CM | POA: Diagnosis not present

## 2020-01-03 ENCOUNTER — Ambulatory Visit: Payer: PPO | Attending: Internal Medicine

## 2020-01-03 ENCOUNTER — Other Ambulatory Visit: Payer: Self-pay

## 2020-01-03 DIAGNOSIS — Z23 Encounter for immunization: Secondary | ICD-10-CM

## 2020-01-03 NOTE — Progress Notes (Signed)
   Covid-19 Vaccination Clinic  Name:  Thomas [name]    MRN: [id number] DOB: 22-Jul-1946  01/03/2020  [name] was observed post Covid-19 immunization for 15 minutes without incidence. He was provided with Vaccine Information Sheet and instruction to access the V-Safe system.   [name] was instructed to call 911 with any severe reactions post vaccine: Marland Kitchen Difficulty breathing  . Swelling of your face and throat  . A fast heartbeat  . A bad rash all over your body  . Dizziness and weakness    Immunizations Administered    Name Date Dose VIS Date Route   Pfizer COVID-19 Vaccine 01/03/2020 12:46 PM 0.3 mL 10/16/2019 Intramuscular   Manufacturer: [place]   Lot: [id number]   [place]: [id number]

## 2020-02-03 ENCOUNTER — Ambulatory Visit: Payer: PPO | Attending: Internal Medicine

## 2020-02-03 DIAGNOSIS — Z23 Encounter for immunization: Secondary | ICD-10-CM

## 2020-02-03 NOTE — Progress Notes (Signed)
   Covid-19 Vaccination Clinic  Name:  Thomas [name]    MRN: [id number] DOB: Jan 19, 1946  02/03/2020  Thomas [name] was observed post Covid-19 immunization for 15 minutes without incident. He was provided with Vaccine Information Sheet and instruction to access the V-Safe system.   Thomas [name] was instructed to call 911 with any severe reactions post vaccine: Marland Kitchen Difficulty breathing  . Swelling of face and throat  . A fast heartbeat  . A bad rash all over body  . Dizziness and weakness   Immunizations Administered    Name Date Dose VIS Date Route   Pfizer COVID-19 Vaccine 02/03/2020  3:51 PM 0.3 mL 10/16/2019 Intramuscular   Manufacturer: [place]   Lot: [id number]   [place]: [id number]

## 2020-03-02 DIAGNOSIS — C61 Malignant neoplasm of prostate: Secondary | ICD-10-CM | POA: Diagnosis not present

## 2020-03-09 DIAGNOSIS — C61 Malignant neoplasm of prostate: Secondary | ICD-10-CM | POA: Diagnosis not present

## 2020-08-02 DIAGNOSIS — Z0000 Encounter for general adult medical examination without abnormal findings: Secondary | ICD-10-CM | POA: Diagnosis not present

## 2020-08-02 DIAGNOSIS — C61 Malignant neoplasm of prostate: Secondary | ICD-10-CM | POA: Diagnosis not present

## 2020-08-02 DIAGNOSIS — I341 Nonrheumatic mitral (valve) prolapse: Secondary | ICD-10-CM | POA: Diagnosis not present

## 2020-08-02 DIAGNOSIS — B351 Tinea unguium: Secondary | ICD-10-CM | POA: Diagnosis not present

## 2020-08-02 DIAGNOSIS — E785 Hyperlipidemia, unspecified: Secondary | ICD-10-CM | POA: Diagnosis not present

## 2020-09-02 DIAGNOSIS — Z23 Encounter for immunization: Secondary | ICD-10-CM | POA: Diagnosis not present

## 2020-09-12 ENCOUNTER — Ambulatory Visit: Payer: PPO | Admitting: Podiatry

## 2020-09-12 ENCOUNTER — Other Ambulatory Visit: Payer: Self-pay

## 2020-09-12 DIAGNOSIS — M79675 Pain in left toe(s): Secondary | ICD-10-CM | POA: Diagnosis not present

## 2020-09-12 DIAGNOSIS — L603 Nail dystrophy: Secondary | ICD-10-CM

## 2020-09-12 DIAGNOSIS — L600 Ingrowing nail: Secondary | ICD-10-CM

## 2020-09-12 DIAGNOSIS — B351 Tinea unguium: Secondary | ICD-10-CM

## 2020-09-12 NOTE — Progress Notes (Signed)
   HPI: 74 y.o. male presenting today presenting as a new patient for evaluation of a dystrophic nail to the left hallux.  Patient states that it is very painful and sensitive.  Patient states that he sustained an injury back in 1994 and ever since the nails been thickened and dystrophic and discolored.  Most recently he has had some bleeding and sensitivity to the medial border of the nail fold.  He presents for further treatment and evaluation  Past Medical History:  Diagnosis Date  . Bilateral inguinal hernia   . BPH (benign prostatic hyperplasia)   . Cancer [facility])    prostate cancer; per patient being followed by Dr [name] at [facility] urology ; currently no chemotherapy   . Mitral valve prolapse    now seeing [name]      Physical Exam: General: The patient is alert and oriented x3 in no acute distress.  Dermatology: Skin is warm, dry and supple bilateral lower extremities. Negative for open lesions or macerations.  Hyperkeratotic dystrophic discolored nail noted to the left hallux nail plate.  It does appear that the medial border of the nail plate is intruding to the soft tissue skin on the medial nail fold.  There is some mild local erythema around the area as well.  Vascular: Palpable pedal pulses bilaterally. No edema or erythema noted. Capillary refill within normal limits.  Neurological: Epicritic and protective threshold grossly intact bilaterally.   Musculoskeletal Exam: Range of motion within normal limits to all pedal and ankle joints bilateral. Muscle strength 5/5 in all groups bilateral.   Assessment: 1.  Dystrophic nail/onychomycosis of toenail left hallux with an ingrowing nail to the medial portion of the nail plate   Plan of Care:  1. Patient evaluated.  2.  Mechanical debridement of the nail was performed today using a nail nipper without incident or bleeding.  The patient noticed immediate relief with the ingrown portion of the nail. 3.  I explained to the  patient that we could continue routine conservative care including trimming of the nail plate.  The other option would be a total permanent nail avulsion procedure.  The patient would like to consider the procedure. 4.  Return to clinic in 3 months.  If the patient has not improved we will proceed with total permanent nail matricectomy left hallux nail plate      Edrick Kins, DPM [facility]  Dr. Edrick Kins, DPM    [address], [place] [address]                Office [phone]  Fax [phone]

## 2020-09-13 DIAGNOSIS — C61 Malignant neoplasm of prostate: Secondary | ICD-10-CM | POA: Diagnosis not present

## 2020-09-21 DIAGNOSIS — C61 Malignant neoplasm of prostate: Secondary | ICD-10-CM | POA: Diagnosis not present

## 2020-09-23 ENCOUNTER — Ambulatory Visit: Payer: PPO | Admitting: Cardiology

## 2020-09-23 ENCOUNTER — Other Ambulatory Visit: Payer: Self-pay

## 2020-09-23 ENCOUNTER — Encounter: Payer: Self-pay | Admitting: Cardiology

## 2020-09-23 VITALS — BP 120/60 | HR 58 | Ht 70.0 in | Wt 173.0 lb

## 2020-09-23 DIAGNOSIS — I340 Nonrheumatic mitral (valve) insufficiency: Secondary | ICD-10-CM | POA: Diagnosis not present

## 2020-09-23 NOTE — Progress Notes (Signed)
Cardiology Office Note:    Date:  09/23/2020   ID:  Thomas [name], DOB Oct 08, 1946, MRN [id number]  PCP:  [name], MD  [facility] Cardiologist:  No primary care provider on file.  [facility] Electrophysiologist:  None   Referring MD: [name], MD     History of Present Illness:    Thomas [name] is a 74 y.o. male here for follow-up mitral valve prolapse posterior leaflet with moderate mitral regurgitation.  Prior [employer].  Has a friend who had complications after aortic valve surgery.  Every 7 to 8 weeks he used to drive up to the [place] and spent time with his friend.  Now she has moved to the [place] area, [employer] and lives in a house that is over 65 years old.  He has been painting, renovating.  Has 2 Barnabas Lister Russell's.    Past Medical History:  Diagnosis Date  . Bilateral inguinal hernia   . BPH (benign prostatic hyperplasia)   . Cancer [facility])    prostate cancer; per patient being followed by Dr [name] at [facility] urology ; currently no chemotherapy   . Mitral valve prolapse    now seeing [name]     Past Surgical History:  Procedure Laterality Date  . FEMORAL HERNIA REPAIR Right 07/22/2017   Procedure: HERNIA REPAIR FEMORAL;  Surgeon: Thomas Confer, MD;  [facility]: [facility];  Service: General;  Laterality: Right;  . INGUINAL HERNIA REPAIR N/A 07/22/2017   Procedure: LAPAROSCOPIC BILATERAL INGUINAL HERNIA REPAIR WITH MESH;  Surgeon: Thomas Confer, MD;  [facility];  Service: General;  Laterality: N/A;  . INSERTION OF MESH Bilateral 07/22/2017   Procedure: INSERTION OF MESH;  Surgeon: Thomas Confer, MD;  [facility];  Service: General;  Laterality: Bilateral;  . TONSILLECTOMY AND ADENOIDECTOMY      Current Medications: No outpatient medications have been marked as taking for the 09/23/20 encounter (Office Visit) with [name], MD.     Allergies:    Patient has no known allergies.   Social History   Socioeconomic History  . Marital status: Divorced    Spouse name: Not on file  . Number of children: Not on file  . Years of education: Not on file  . Highest education level: Not on file  Occupational History  . Not on file  Tobacco Use  . Smoking status: Never Smoker  . Smokeless tobacco: Never Used  Vaping Use  . Vaping Use: Never used  Substance and Sexual Activity  . Alcohol use: Yes    Comment: seldom   . Drug use: No  . Sexual activity: Not on file  Other Topics Concern  . Not on file  Social History Narrative  . Not on file   Social Determinants of Health   Financial Resource Strain:   . Difficulty of Paying Living Expenses: Not on file  Food Insecurity:   . Worried About Charity fundraiser in the Last Year: Not on file  . Ran Out of Food in the Last Year: Not on file  Transportation Needs:   . Lack of Transportation (Medical): Not on file  . Lack of Transportation (Non-Medical): Not on file  Physical Activity:   . Days of Exercise per Week: Not on file  . Minutes of Exercise per Session: Not on file  Stress:   . Feeling of Stress : Not on file  Social Connections:   .  Frequency of Communication with Friends and Family: Not on file  . Frequency of Social Gatherings with Friends and Family: Not on file  . Attends Religious Services: Not on file  . Active Member of Clubs or Organizations: Not on file  . Attends Archivist Meetings: Not on file  . Marital Status: Not on file     ROS:   Please see the history of present illness.    No fevers chills nausea vomiting syncope bleeding all other systems reviewed and are negative.  EKGs/Labs/Other Studies Reviewed:    The following studies were reviewed today:  Echo: 07/19/14  - Left ventricle: The cavity size was normal. Wall thickness was normal. Systolic function was normal. The estimated ejection fraction was in the range of 60% to 65%.  Features are consistent with a pseudonormal left ventricular filling pattern, with concomitant abnormal relaxation and increased filling pressure (grade 2 diastolic dysfunction). - Aortic valve: There was trivial regurgitation. - Mitral valve: There was mild regurgitation. - Left atrium: The atrium was moderately to severely dilated. - Pulmonic valve: There was moderate regurgitation.  05/29/2017:  - LVEF 65-70%, mild LVH, normal wall motion, grade 1 DD, indeterminate LV filling pressure, late systolic bileaflet prolpase with mild to moderate regurgitation, severe LAE, upper normal RA size, trivial TR, RVSP 28 mmHg, normal IVC.  ECHO 07/03/2019:  1. The left ventricle has hyperdynamic systolic function, with an  ejection fraction of >65%. The cavity size was mildly dilated. Left  ventricular diastolic Doppler parameters are consistent with  pseudonormalization. Elevated left ventricular  end-diastolic pressure.  2. Global longitudinal strain -18.5%.  3. The right ventricle has normal systolic function. The cavity was  normal. There is no increase in right ventricular wall thickness.  4. Left atrial size was severely dilated.  5. The mitral valve is myxomatous. Mild thickening of the mitral valve  leaflet. Mitral valve regurgitation is moderate by color flow Doppler.  6. MV leaflets are thickened with myxomatous appearance   There is bileaflet prolapse.  7. The aortic valve is tricuspid. Mild thickening of the aortic valve.  Aortic valve regurgitation is trivial by color flow Doppler.  8. The inferior vena cava was dilated in size with <50% respiratory  variability.  9. When compared to the prior study: 05/29/17 EF 65-70%. PA pressure  70mmHg.   COmpare to previous echo report, MR is mildly increased and filling  pressures now increased with Gr II diastolic dysfunction.   EKG:  EKG is  ordered today.  The ekg ordered today demonstrates sinus  bradycardia 58 with peaked T waves in V3 and V4 no change from prior EKG.  Recent Labs: No results found for requested labs within last 8760 hours.  Recent Lipid Panel No results found for: CHOL, TRIG, HDL, CHOLHDL, VLDL, LDLCALC, LDLDIRECT   Risk Assessment/Calculations:       Physical Exam:    VS:  BP 120/60   Pulse (!) 58   Ht 5\' 10"  (1.778 m)   Wt 173 lb (78.5 kg)   SpO2 96%   BMI 24.82 kg/m     Wt Readings from Last 3 Encounters:  09/23/20 173 lb (78.5 kg)  06/22/19 178 lb (80.7 kg)  06/02/18 181 lb (82.1 kg)     GEN:  Well nourished, well developed in no acute distress HEENT: Normal NECK: No JVD; No carotid bruits LYMPHATICS: No lymphadenopathy CARDIAC: RRR, blowing holosystolic murmur, rubs, gallops RESPIRATORY:  Clear to auscultation without rales, wheezing or rhonchi  ABDOMEN: Soft, non-tender, non-distended MUSCULOSKELETAL:  No edema; No deformity  SKIN: Warm and dry NEUROLOGIC:  Alert and oriented x 3 PSYCHIATRIC:  Normal affect   ASSESSMENT:    1. Mitral valve insufficiency, unspecified etiology    PLAN:    In order of problems listed above:  Mitral valve prolapse regurgitation -Has been moderate with bilateral leaflet prolapse. -Have discussed rationale for surgery in the past.  Continue with surveillance currently. -Continue with echo surveillance.  Left-sided IVC -Incidental finding noted on CT scan of pelvis in 2018.  LDL 113 triglycerides 56 creatinine 0.85 hemoglobin 13.8 ALT 16.  Outside labs reviewed.  Excellent  Shared Decision Making/Informed Consent        Medication Adjustments/Labs and Tests Ordered: Current medicines are reviewed at length with the patient today.  Concerns regarding medicines are outlined above.  Orders Placed This Encounter  Procedures  . EKG 12-Lead  . ECHOCARDIOGRAM COMPLETE   No orders of the defined types were placed in this encounter.   Patient Instructions  Medication Instructions:  The current  medical regimen is effective;  continue present plan and medications.  *If you need a refill on your cardiac medications before your next appointment, please call your pharmacy*  Testing/Procedures: Your physician has requested that you have an echocardiogram. Echocardiography is a painless test that uses sound waves to create images of your heart. It provides your doctor with information about the size and shape of your heart and how well your heart's chambers and valves are working. This procedure takes approximately one hour. There are no restrictions for this procedure.  Follow-Up: At [facility], you and your health needs are our priority.  As part of our continuing mission to provide you with exceptional heart care, we have created designated Provider Care Teams.  These Care Teams include your primary Cardiologist (physician) and Advanced Practice Providers (APPs -  Physician Assistants and Nurse Practitioners) who all work together to provide you with the care you need, when you need it.  We recommend signing up for the patient portal called "MyChart".  Sign up information is provided on this After Visit Summary.  MyChart is used to connect with patients for Virtual Visits (Telemedicine).  Patients are able to view lab/test results, encounter notes, upcoming appointments, etc.  Non-urgent messages can be sent to your provider as well.   To learn more about what you can do with MyChart, go to [email or web address].    Your next appointment:   12 month(s)  The format for your next appointment:   In Person  Provider:   Candee Furbish, MD  Thank you for choosing [facility]!!        Signed, Candee Furbish, MD  09/23/2020 1:41 PM    [place]

## 2020-09-23 NOTE — Patient Instructions (Addendum)
Medication Instructions:  The current medical regimen is effective;  continue present plan and medications.  *If you need a refill on your cardiac medications before your next appointment, please call your pharmacy*  Testing/Procedures: Your physician has requested that you have an echocardiogram. Echocardiography is a painless test that uses sound waves to create images of your heart. It provides your doctor with information about the size and shape of your heart and how well your heart's chambers and valves are working. This procedure takes approximately one hour. There are no restrictions for this procedure.  Follow-Up: At [facility], you and your health needs are our priority.  As part of our continuing mission to provide you with exceptional heart care, we have created designated Provider Care Teams.  These Care Teams include your primary Cardiologist (physician) and Advanced Practice Providers (APPs -  Physician Assistants and Nurse Practitioners) who all work together to provide you with the care you need, when you need it.  We recommend signing up for the patient portal called "MyChart".  Sign up information is provided on this After Visit Summary.  MyChart is used to connect with patients for Virtual Visits (Telemedicine).  Patients are able to view lab/test results, encounter notes, upcoming appointments, etc.  Non-urgent messages can be sent to your provider as well.   To learn more about what you can do with MyChart, go to [email or web address].    Your next appointment:   12 month(s)  The format for your next appointment:   In Person  Provider:   Mark Skains, MD   Thank you for choosing [facility]!!      

## 2020-10-18 ENCOUNTER — Other Ambulatory Visit: Payer: Self-pay

## 2020-10-18 ENCOUNTER — Ambulatory Visit (HOSPITAL_COMMUNITY): Payer: PPO | Attending: Cardiology

## 2020-10-18 DIAGNOSIS — I340 Nonrheumatic mitral (valve) insufficiency: Secondary | ICD-10-CM | POA: Diagnosis not present

## 2020-11-14 ENCOUNTER — Other Ambulatory Visit: Payer: Self-pay

## 2020-11-14 ENCOUNTER — Ambulatory Visit: Payer: PPO | Admitting: Cardiology

## 2020-11-14 ENCOUNTER — Encounter: Payer: Self-pay | Admitting: Cardiology

## 2020-11-14 VITALS — BP 130/64 | HR 68 | Ht 70.0 in | Wt 177.0 lb

## 2020-11-14 DIAGNOSIS — I340 Nonrheumatic mitral (valve) insufficiency: Secondary | ICD-10-CM

## 2020-11-14 DIAGNOSIS — Z01812 Encounter for preprocedural laboratory examination: Secondary | ICD-10-CM

## 2020-11-14 NOTE — Addendum Note (Signed)
Addended by: [name] on: 11/14/2020 05:37 PM   Modules accepted: Orders, SmartSet

## 2020-11-14 NOTE — H&P (View-Only) (Signed)
Cardiology Office Note:    Date:  11/14/2020   ID:  Thomas [name], DOB 05-18-1946, MRN [id number]  PCP:  [name], MD  [facility] Cardiologist:  No primary care provider on file.  [facility] Electrophysiologist:  None   Referring MD: [name], MD     History of Present Illness:    Thomas [name] is a 75 y.o. male here for the follow-up of mitral valve regurgitation, posterior prolapse.   Prior [employer].  Has a friend who had complications after aortic valve surgery.  Every 7 to 8 weeks he used to drive up to the [place] and spent time with his friend.  Now she has moved to the [place] area, [employer] and lives in a house that is over 98 years old.  He has been painting, renovating.  Has 2 Meryle Ready  Past Medical History:  Diagnosis Date  . Bilateral inguinal hernia   . BPH (benign prostatic hyperplasia)   . Cancer [facility])    prostate cancer; per patient being followed by Dr [name] at [facility] urology ; currently no chemotherapy   . Mitral valve prolapse    now seeing [name]     Past Surgical History:  Procedure Laterality Date  . FEMORAL HERNIA REPAIR Right 07/22/2017   Procedure: HERNIA REPAIR FEMORAL;  Surgeon: Jackolyn Confer, MD;  [facility];  Service: General;  Laterality: Right;  . INGUINAL HERNIA REPAIR N/A 07/22/2017   Procedure: LAPAROSCOPIC BILATERAL INGUINAL HERNIA REPAIR WITH MESH;  Surgeon: Jackolyn Confer, MD;  [facility];  Service: General;  Laterality: N/A;  . INSERTION OF MESH Bilateral 07/22/2017   Procedure: INSERTION OF MESH;  Surgeon: Jackolyn Confer, MD;  [facility];  Service: General;  Laterality: Bilateral;  . TONSILLECTOMY AND ADENOIDECTOMY      Current Medications: Current Meds  Medication Sig  . Cholecalciferol 50 MCG (2000 UT) TABS 1 tablet  . Multiple Vitamin (MULTIVITAMIN) capsule Take 1 capsule by mouth daily.      Allergies:   Patient has no known allergies.   Social History   Socioeconomic History  . Marital status: Divorced    Spouse name: Not on file  . Number of children: Not on file  . Years of education: Not on file  . Highest education level: Not on file  Occupational History  . Not on file  Tobacco Use  . Smoking status: Never Smoker  . Smokeless tobacco: Never Used  Vaping Use  . Vaping Use: Never used  Substance and Sexual Activity  . Alcohol use: Yes    Comment: seldom   . Drug use: No  . Sexual activity: Not on file  Other Topics Concern  . Not on file  Social History Narrative  . Not on file   Social Determinants of Health   Financial Resource Strain: Not on file  Food Insecurity: Not on file  Transportation Needs: Not on file  Physical Activity: Not on file  Stress: Not on file  Social Connections: Not on file     Family History: No early family history of CAD  ROS:   Please see the history of present illness.     All other systems reviewed and are negative.  EKGs/Labs/Other Studies Reviewed:    The following studies were reviewed today: ECHO 10/18/20: Normal LV function; bileaflet MVP (posterior leaflet > anterior  leaflet); likely severe MR but difficult to quantitate; suggest TEE to  further assess.  2. Left ventricular ejection fraction, by estimation, is 60 to 65%. The  left ventricle has normal function. The left ventricle has no regional  wall motion abnormalities. The left ventricular internal cavity size was  mildly dilated. Left ventricular  diastolic parameters are indeterminate. Elevated left atrial pressure.  3. Right ventricular systolic function is normal. The right ventricular  size is normal.  4. Left atrial size was moderately dilated.  5. The mitral valve is myxomatous. Moderate to severe mitral valve  regurgitation. No evidence of mitral stenosis. There is severe prolapse of  both leaflets of the mitral valve.  6. The  aortic valve is tricuspid. Aortic valve regurgitation is trivial.  Mild aortic valve sclerosis is present, with no evidence of aortic valve  stenosis.  7. The inferior vena cava is normal in size with greater than 50%  respiratory variability, suggesting right atrial pressure of 3 mmHg.  EKG:  EKG is  ordered today.  The ekg ordered today demonstrates sinus rhythm 64 with no other abnormalities  Recent Labs: No results found for requested labs within last 8760 hours.  Recent Lipid Panel No results found for: CHOL, TRIG, HDL, CHOLHDL, VLDL, LDLCALC, LDLDIRECT   Risk Assessment/Calculations:       Physical Exam:    VS:  BP 130/64   Pulse 68   Ht 5\' 10"  (1.778 m)   Wt 177 lb (80.3 kg)   BMI 25.40 kg/m     Wt Readings from Last 3 Encounters:  11/14/20 177 lb (80.3 kg)  09/23/20 173 lb (78.5 kg)  06/22/19 178 lb (80.7 kg)     GEN:  Well nourished, well developed in no acute distress HEENT: Normal NECK: No JVD; No carotid bruits LYMPHATICS: No lymphadenopathy CARDIAC: RRR, 3/6 HSM left border,no rubs, gallops RESPIRATORY:  Clear to auscultation without rales, wheezing or rhonchi  ABDOMEN: Soft, non-tender, non-distended MUSCULOSKELETAL:  No edema; No deformity  SKIN: Warm and dry NEUROLOGIC:  Alert and oriented x 3 PSYCHIATRIC:  Normal affect   ASSESSMENT:    1. Mitral valve insufficiency, unspecified etiology   2. Pre-procedure lab exam    PLAN:    In order of problems listed above:   Mitral valve prolapse with severe regurgitation - We will go ahead and check a transesophageal echocardiogram.  Risk and benefits of been explained he is willing to proceed. -After this procedure is done, we will refer him to cardiothoracic surgery for further evaluation of mitral valve repair. -Of course he will need a left heart catheterization prior to this. - He walks his dog over a mile a day.  Minimal if any symptoms.  Left-sided IVC -Incidental finding noted on CT scan  of pelvis in 2018.  Take this into account.  LDL 113 triglycerides 56 creatinine 0.85 hemoglobin 13.8 ALT 16.  Outside labs reviewed.  Excellent    Shared Decision Making/Informed Consent The risks [esophageal damage, perforation (1:10,000 risk), bleeding, pharyngeal hematoma as well as other potential complications associated with conscious sedation including aspiration, arrhythmia, respiratory failure and death], benefits (treatment guidance and diagnostic support) and alternatives of a transesophageal echocardiogram were discussed in detail with Thomas [name] and he is willing to proceed.       Medication Adjustments/Labs and Tests Ordered: Current medicines are reviewed at length with the patient today.  Concerns regarding medicines are outlined above.  Orders Placed This Encounter  Procedures  . CBC  . Basic metabolic panel  . EKG 12-Lead   No orders  of the defined types were placed in this encounter.   Patient Instructions  Medication Instructions:  The current medical regimen is effective;  continue present plan and medications.  *If you need a refill on your cardiac medications before your next appointment, please call your pharmacy*  Lab Work: Please return for blood work (CBC, BMP) You will need Covid Screening to be completed at the drive through location [place], [place], [place] as scheduled.  You will need to self quarantine after this testing and until the time of your procedure.  If you have labs (blood work) drawn today and your tests are completely normal, you will receive your results only by: Marland Kitchen MyChart Message (if you have MyChart) OR . A paper copy in the mail If you have any lab test that is abnormal or we need to change your treatment, we will call you to review the results.  Testing/Procedures: Your physician has requested that you have a TEE. During a TEE, sound waves are used to create images of your heart. It provides your doctor with  information about the size and shape of your heart and how well your heart's chambers and valves are working. In this test, a transducer is attached to the end of a flexible tube that's guided down your throat and into your esophagus (the tube leading from you mouth to your stomach) to get a more detailed image of your heart. You are not awake for the procedure.   Follow-Up: At [facility], you and your health needs are our priority.  As part of our continuing mission to provide you with exceptional heart care, we have created designated Provider Care Teams.  These Care Teams include your primary Cardiologist (physician) and Advanced Practice Providers (APPs -  Physician Assistants and Nurse Practitioners) who all work together to provide you with the care you need, when you need it.  We recommend signing up for the patient portal called "MyChart".  Sign up information is provided on this After Visit Summary.  MyChart is used to connect with patients for Virtual Visits (Telemedicine).  Patients are able to view lab/test results, encounter notes, upcoming appointments, etc.  Non-urgent messages can be sent to your provider as well.   To learn more about what you can do with MyChart, go to [email or web address].    Your next appointment:   Will be determined after your TEE has been completed.  The format for your next appointment:   In Person  Provider:   Candee Furbish, MD   Thank you for choosing [facility]!!        Signed, Candee Furbish, MD  11/14/2020 5:23 PM    [place]

## 2020-11-14 NOTE — Patient Instructions (Signed)
Medication Instructions:  The current medical regimen is effective;  continue present plan and medications.  *If you need a refill on your cardiac medications before your next appointment, please call your pharmacy*  Lab Work: Please return for blood work (CBC, BMP) You will need Covid Screening to be completed at the drive through location [place], [place], [place] as scheduled.  You will need to self quarantine after this testing and until the time of your procedure.  If you have labs (blood work) drawn today and your tests are completely normal, you will receive your results only by: Marland Kitchen MyChart Message (if you have MyChart) OR . A paper copy in the mail If you have any lab test that is abnormal or we need to change your treatment, we will call you to review the results.  Testing/Procedures: Your physician has requested that you have a TEE. During a TEE, sound waves are used to create images of your heart. It provides your doctor with information about the size and shape of your heart and how well your heart's chambers and valves are working. In this test, a transducer is attached to the end of a flexible tube that's guided down your throat and into your esophagus (the tube leading from you mouth to your stomach) to get a more detailed image of your heart. You are not awake for the procedure.   Follow-Up: At [facility], you and your health needs are our priority.  As part of our continuing mission to provide you with exceptional heart care, we have created designated Provider Care Teams.  These Care Teams include your primary Cardiologist (physician) and Advanced Practice Providers (APPs -  Physician Assistants and Nurse Practitioners) who all work together to provide you with the care you need, when you need it.  We recommend signing up for the patient portal called "MyChart".  Sign up information is provided on this After Visit Summary.  MyChart is used to connect with patients  for Virtual Visits (Telemedicine).  Patients are able to view lab/test results, encounter notes, upcoming appointments, etc.  Non-urgent messages can be sent to your provider as well.   To learn more about what you can do with MyChart, go to [email or web address].    Your next appointment:   Will be determined after your TEE has been completed.  The format for your next appointment:   In Person  Provider:   Candee Furbish, MD   Thank you for choosing [facility]!!

## 2020-11-14 NOTE — Progress Notes (Signed)
Cardiology Office Note:    Date:  11/14/2020   ID:  Thomas [name], DOB 05-18-1946, MRN [id number]  PCP:  Thomas Dials, MD  [facility] Cardiologist:  No primary care provider on file.  [facility] Electrophysiologist:  None   Referring MD: Thomas Dials, MD     History of Present Illness:    Thomas [name] is a 75 y.o. male here for the follow-up of mitral valve regurgitation, posterior prolapse.   Prior [employer].  Has a friend who had complications after aortic valve surgery.  Every 7 to 8 weeks he used to drive up to the [place] and spent time with his friend.  Now she has moved to the [place] area, [employer] and lives in a house that is over 98 years old.  He has been painting, renovating.  Has 2 Thomas [name]  Past Medical History:  Diagnosis Date  . Bilateral inguinal hernia   . BPH (benign prostatic hyperplasia)   . Cancer [facility])    prostate cancer; per patient being followed by Thomas [name] at [facility] urology ; currently no chemotherapy   . Mitral valve prolapse    now seeing Thomas [name]     Past Surgical History:  Procedure Laterality Date  . FEMORAL HERNIA REPAIR Right 07/22/2017   Procedure: HERNIA REPAIR FEMORAL;  Surgeon: Thomas Confer, MD;  Location: [facility];  Service: General;  Laterality: Right;  . INGUINAL HERNIA REPAIR N/A 07/22/2017   Procedure: LAPAROSCOPIC BILATERAL INGUINAL HERNIA REPAIR WITH MESH;  Surgeon: Thomas Confer, MD;  Location: [facility];  Service: General;  Laterality: N/A;  . INSERTION OF MESH Bilateral 07/22/2017   Procedure: INSERTION OF MESH;  Surgeon: Thomas Confer, MD;  Location: [facility];  Service: General;  Laterality: Bilateral;  . TONSILLECTOMY AND ADENOIDECTOMY      Current Medications: Current Meds  Medication Sig  . Cholecalciferol 50 MCG (2000 UT) TABS 1 tablet  . Multiple Vitamin (MULTIVITAMIN) capsule Take 1 capsule by mouth daily.      Allergies:   Patient has no known allergies.   Social History   Socioeconomic History  . Marital status: Divorced    Spouse name: Not on file  . Number of children: Not on file  . Years of education: Not on file  . Highest education level: Not on file  Occupational History  . Not on file  Tobacco Use  . Smoking status: Never Smoker  . Smokeless tobacco: Never Used  Vaping Use  . Vaping Use: Never used  Substance and Sexual Activity  . Alcohol use: Yes    Comment: seldom   . Drug use: No  . Sexual activity: Not on file  Other Topics Concern  . Not on file  Social History Narrative  . Not on file   Social Determinants of Health   Financial Resource Strain: Not on file  Food Insecurity: Not on file  Transportation Needs: Not on file  Physical Activity: Not on file  Stress: Not on file  Social Connections: Not on file     Family History: No early family history of CAD  ROS:   Please see the history of present illness.     All other systems reviewed and are negative.  EKGs/Labs/Other Studies Reviewed:    The following studies were reviewed today: ECHO 10/18/20: Normal LV function; bileaflet MVP (posterior leaflet > anterior  leaflet); likely severe MR but difficult to quantitate; suggest TEE to  further assess.  2. Left ventricular ejection fraction, by estimation, is 60 to 65%. The  left ventricle has normal function. The left ventricle has no regional  wall motion abnormalities. The left ventricular internal cavity size was  mildly dilated. Left ventricular  diastolic parameters are indeterminate. Elevated left atrial pressure.  3. Right ventricular systolic function is normal. The right ventricular  size is normal.  4. Left atrial size was moderately dilated.  5. The mitral valve is myxomatous. Moderate to severe mitral valve  regurgitation. No evidence of mitral stenosis. There is severe prolapse of  both leaflets of the mitral valve.  6. The  aortic valve is tricuspid. Aortic valve regurgitation is trivial.  Mild aortic valve sclerosis is present, with no evidence of aortic valve  stenosis.  7. The inferior vena cava is normal in size with greater than 50%  respiratory variability, suggesting right atrial pressure of 3 mmHg.  EKG:  EKG is  ordered today.  The ekg ordered today demonstrates sinus rhythm 64 with no other abnormalities  Recent Labs: No results found for requested labs within last 8760 hours.  Recent Lipid Panel No results found for: CHOL, TRIG, HDL, CHOLHDL, VLDL, LDLCALC, LDLDIRECT   Risk Assessment/Calculations:       Physical Exam:    VS:  BP 130/64   Pulse 68   Ht 5\' 10"  (1.778 m)   Wt 177 lb (80.3 kg)   BMI 25.40 kg/m     Wt Readings from Last 3 Encounters:  11/14/20 177 lb (80.3 kg)  09/23/20 173 lb (78.5 kg)  06/22/19 178 lb (80.7 kg)     GEN:  Well nourished, well developed in no acute distress HEENT: Normal NECK: No JVD; No carotid bruits LYMPHATICS: No lymphadenopathy CARDIAC: RRR, 3/6 HSM left border,no rubs, gallops RESPIRATORY:  Clear to auscultation without rales, wheezing or rhonchi  ABDOMEN: Soft, non-tender, non-distended MUSCULOSKELETAL:  No edema; No deformity  SKIN: Warm and dry NEUROLOGIC:  Alert and oriented x 3 PSYCHIATRIC:  Normal affect   ASSESSMENT:    1. Mitral valve insufficiency, unspecified etiology   2. Pre-procedure lab exam    PLAN:    In order of problems listed above:   Mitral valve prolapse with severe regurgitation - We will go ahead and check a transesophageal echocardiogram.  Risk and benefits of been explained he is willing to proceed. -After this procedure is done, we will refer him to cardiothoracic surgery for further evaluation of mitral valve repair. -Of course he will need a left heart catheterization prior to this. - He walks his dog over a mile a day.  Minimal if any symptoms.  Left-sided IVC -Incidental finding noted on CT scan  of pelvis in 2018.  Take this into account.  LDL 113 triglycerides 56 creatinine 0.85 hemoglobin 13.8 ALT 16.  Outside labs reviewed.  Excellent    Shared Decision Making/Informed Consent The risks [esophageal damage, perforation (1:10,000 risk), bleeding, pharyngeal hematoma as well as other potential complications associated with conscious sedation including aspiration, arrhythmia, respiratory failure and death], benefits (treatment guidance and diagnostic support) and alternatives of a transesophageal echocardiogram were discussed in detail with Thomas [name] and he is willing to proceed.       Medication Adjustments/Labs and Tests Ordered: Current medicines are reviewed at length with the patient today.  Concerns regarding medicines are outlined above.  Orders Placed This Encounter  Procedures  . CBC  . Basic metabolic panel  . EKG 12-Lead   No orders  of the defined types were placed in this encounter.   Patient Instructions  Medication Instructions:  The current medical regimen is effective;  continue present plan and medications.  *If you need a refill on your cardiac medications before your next appointment, please call your pharmacy*  Lab Work: Please return for blood work (CBC, BMP) You will need Covid Screening to be completed at the drive through location [place], [place], [place] as scheduled.  You will need to self quarantine after this testing and until the time of your procedure.  If you have labs (blood work) drawn today and your tests are completely normal, you will receive your results only by: Marland Kitchen MyChart Message (if you have MyChart) OR . A paper copy in the mail If you have any lab test that is abnormal or we need to change your treatment, we will call you to review the results.  Testing/Procedures: Your physician has requested that you have a TEE. During a TEE, sound waves are used to create images of your heart. It provides your doctor with  information about the size and shape of your heart and how well your heart's chambers and valves are working. In this test, a transducer is attached to the end of a flexible tube that's guided down your throat and into your esophagus (the tube leading from you mouth to your stomach) to get a more detailed image of your heart. You are not awake for the procedure.   Follow-Up: At [facility], you and your health needs are our priority.  As part of our continuing mission to provide you with exceptional heart care, we have created designated Provider Care Teams.  These Care Teams include your primary Cardiologist (physician) and Advanced Practice Providers (APPs -  Physician Assistants and Nurse Practitioners) who all work together to provide you with the care you need, when you need it.  We recommend signing up for the patient portal called "MyChart".  Sign up information is provided on this After Visit Summary.  MyChart is used to connect with patients for Virtual Visits (Telemedicine).  Patients are able to view lab/test results, encounter notes, upcoming appointments, etc.  Non-urgent messages can be sent to your provider as well.   To learn more about what you can do with MyChart, go to [email or web address].    Your next appointment:   Will be determined after your TEE has been completed.  The format for your next appointment:   In Person  Provider:   Candee Furbish, MD   Thank you for choosing [facility]!!        Signed, Candee Furbish, MD  11/14/2020 5:23 PM    [place]

## 2020-11-15 ENCOUNTER — Encounter: Payer: Self-pay | Admitting: *Deleted

## 2020-11-15 ENCOUNTER — Telehealth: Payer: Self-pay | Admitting: Cardiology

## 2020-11-15 NOTE — Telephone Encounter (Signed)
  [place] OFFICE [place], [place] [address] Dept: [phone] Loc: [place]  11/15/2020  You are scheduled for a TEE on Wednesday November 23, 2020 with Dr. Audie Box.  1. Please arrive at the [facility] (Main Entrance A) at [facility]: [place], [place] [address] at [address] parking service is available.   2. Diet: Do not eat solid foods after midnight.   3. Labs: You will need to have blood drawn on Monday 11/21/2020 at our [place] office. You do not need to be fasting.  Please report for Covid screening at [place], [place], [place] after your blood work.  This is a drive through testing site.  Please follow the signs and stay in your vehicle.  Someone will come to you to complete the screening.  Once it has been completed, you are required to self-quarantine at home until the day of your procedure.  4. Medication instructions in preparation for your procedure:  You may take your normal morning medications with sips of water  5. Bring a current list of your medications and current insurance cards. 6. You MUST have a responsible person to drive you home. 7. Someone MUST be with you the first 24 hours after you arrive home or your discharge will be delayed. 8. Please wear clothes that are easy to get on and off and wear slip-on shoes.  Thank you for allowing [place] to care for you!   -- [facility]  Pt is aware of instructions, dates and times.  He had no further questions at the end of the call but will c/b if necessary.

## 2020-11-15 NOTE — Telephone Encounter (Signed)
Pt has been scheduled for TEE on Wednesday 11/23/2020 with Dr Audie Box.  Lab and Covid screening are scheduled for 1/17.

## 2020-11-15 NOTE — Telephone Encounter (Signed)
Aimee is calling stating he is wanting to speak with Pam to schedule his TEE for next week instead of the week after next. Please advise.

## 2020-11-19 ENCOUNTER — Other Ambulatory Visit (HOSPITAL_COMMUNITY)
Admission: RE | Admit: 2020-11-19 | Discharge: 2020-11-19 | Disposition: A | Discharge status: Home / Self Care | Payer: PPO | Source: Ambulatory Visit | Attending: Cardiovascular Disease | Admitting: Cardiovascular Disease

## 2020-11-19 DIAGNOSIS — Z20822 Contact with and (suspected) exposure to covid-19: Secondary | ICD-10-CM | POA: Diagnosis not present

## 2020-11-19 DIAGNOSIS — Z01812 Encounter for preprocedural laboratory examination: Secondary | ICD-10-CM | POA: Insufficient documentation

## 2020-11-19 LAB — SARS CORONAVIRUS 2 (TAT 6-24 HRS): SARS Coronavirus 2: NEGATIVE

## 2020-11-21 ENCOUNTER — Other Ambulatory Visit (HOSPITAL_COMMUNITY): Payer: PPO

## 2020-11-21 ENCOUNTER — Other Ambulatory Visit: Payer: PPO

## 2020-11-21 ENCOUNTER — Telehealth: Payer: Self-pay | Admitting: Cardiology

## 2020-11-21 NOTE — Telephone Encounter (Signed)
Due to inclement weather, our office will be closed on 1/18.  Pt contacted about lab appt for upcoming TEE.  Made pt aware to arrive to TEE appt 30 mins earlier so we can get labs drawn.  Pt in agreement with plan.

## 2020-11-22 ENCOUNTER — Other Ambulatory Visit: Payer: PPO

## 2020-11-23 ENCOUNTER — Encounter (HOSPITAL_COMMUNITY)
Admission: RE | Disposition: A | Discharge status: Home / Self Care | Payer: Self-pay | Source: Home / Self Care | Attending: Cardiovascular Disease

## 2020-11-23 ENCOUNTER — Ambulatory Visit (HOSPITAL_COMMUNITY): Payer: PPO | Admitting: Certified Registered"

## 2020-11-23 ENCOUNTER — Other Ambulatory Visit: Payer: Self-pay

## 2020-11-23 ENCOUNTER — Ambulatory Visit (HOSPITAL_COMMUNITY): Admit: 2020-11-23 | Payer: PPO | Admitting: Cardiovascular Disease

## 2020-11-23 ENCOUNTER — Encounter (HOSPITAL_COMMUNITY): Payer: Self-pay | Admitting: Cardiovascular Disease

## 2020-11-23 ENCOUNTER — Ambulatory Visit (HOSPITAL_BASED_OUTPATIENT_CLINIC_OR_DEPARTMENT_OTHER)
Admission: RE | Admit: 2020-11-23 | Discharge: 2020-11-23 | Disposition: A | Discharge status: Home / Self Care | Payer: PPO | Source: Ambulatory Visit | Attending: Cardiology | Admitting: Cardiology

## 2020-11-23 ENCOUNTER — Ambulatory Visit (HOSPITAL_COMMUNITY)
Admission: RE | Admit: 2020-11-23 | Discharge: 2020-11-23 | Disposition: A | Discharge status: Home / Self Care | Payer: PPO | Attending: Cardiovascular Disease | Admitting: Cardiovascular Disease

## 2020-11-23 DIAGNOSIS — K4020 Bilateral inguinal hernia, without obstruction or gangrene, not specified as recurrent: Secondary | ICD-10-CM | POA: Diagnosis not present

## 2020-11-23 DIAGNOSIS — I340 Nonrheumatic mitral (valve) insufficiency: Secondary | ICD-10-CM

## 2020-11-23 DIAGNOSIS — Z01812 Encounter for preprocedural laboratory examination: Secondary | ICD-10-CM

## 2020-11-23 DIAGNOSIS — C61 Malignant neoplasm of prostate: Secondary | ICD-10-CM | POA: Diagnosis not present

## 2020-11-23 DIAGNOSIS — I088 Other rheumatic multiple valve diseases: Secondary | ICD-10-CM | POA: Diagnosis not present

## 2020-11-23 DIAGNOSIS — I080 Rheumatic disorders of both mitral and aortic valves: Secondary | ICD-10-CM | POA: Diagnosis not present

## 2020-11-23 DIAGNOSIS — I700 Atherosclerosis of aorta: Secondary | ICD-10-CM | POA: Diagnosis not present

## 2020-11-23 DIAGNOSIS — N400 Enlarged prostate without lower urinary tract symptoms: Secondary | ICD-10-CM | POA: Diagnosis not present

## 2020-11-23 HISTORY — PX: TEE WITHOUT CARDIOVERSION: SHX5443

## 2020-11-23 HISTORY — PX: BUBBLE STUDY: SHX6837

## 2020-11-23 LAB — ECHO TEE
MV M vel: 5.81 m/s
MV Peak grad: 135 mmHg
Radius: 1.2 cm

## 2020-11-23 LAB — CBC
HCT: 45.7 % (ref 39.0–52.0)
Hemoglobin: 15.1 g/dL (ref 13.0–17.0)
MCH: 32.6 pg (ref 26.0–34.0)
MCHC: 33 g/dL (ref 30.0–36.0)
MCV: 98.7 fL (ref 80.0–100.0)
Platelets: 225 10*3/uL (ref 150–400)
RBC: 4.63 MIL/uL (ref 4.22–5.81)
RDW: 12.6 % (ref 11.5–15.5)
WBC: 7 10*3/uL (ref 4.0–10.5)
nRBC: 0 % (ref 0.0–0.2)

## 2020-11-23 LAB — BASIC METABOLIC PANEL
Anion gap: 9 (ref 5–15)
BUN: 19 mg/dL (ref 8–23)
CO2: 28 mmol/L (ref 22–32)
Calcium: 9.9 mg/dL (ref 8.9–10.3)
Chloride: 102 mmol/L (ref 98–111)
Creatinine, Ser: 0.88 mg/dL (ref 0.61–1.24)
GFR, Estimated: >60 mL/min (ref >60)
Glucose, Bld: 93 mg/dL (ref 70–99)
Potassium: 4.1 mmol/L (ref 3.5–5.1)
Sodium: 139 mmol/L (ref 135–145)

## 2020-11-23 SURGERY — CANCELLED PROCEDURE

## 2020-11-23 SURGERY — ECHOCARDIOGRAM, TRANSESOPHAGEAL
Anesthesia: Monitor Anesthesia Care

## 2020-11-23 MED ORDER — PHENYLEPHRINE 40 MCG/ML (10ML) SYRINGE FOR IV PUSH (FOR BLOOD PRESSURE SUPPORT)
PREFILLED_SYRINGE | INTRAVENOUS | Status: DC | PRN
  Administered 2020-11-23: 80 ug via INTRAVENOUS
  Administered 2020-11-23 (×2): 160 ug via INTRAVENOUS

## 2020-11-23 MED ORDER — PROPOFOL 10 MG/ML IV BOLUS
INTRAVENOUS | Status: DC | PRN
  Administered 2020-11-23: 150 ug/kg/min via INTRAVENOUS

## 2020-11-23 MED ORDER — BUTAMBEN-TETRACAINE-BENZOCAINE 2-2-14 % EX AERO
INHALATION_SPRAY | CUTANEOUS | Status: DC | PRN
  Administered 2020-11-23: 2 via TOPICAL

## 2020-11-23 MED ORDER — SODIUM CHLORIDE 0.9 % IV SOLN: INTRAVENOUS | Status: DC

## 2020-11-23 MED ORDER — EPHEDRINE SULFATE-NACL 50-0.9 MG/10ML-% IV SOSY
PREFILLED_SYRINGE | INTRAVENOUS | Status: DC | PRN
  Administered 2020-11-23: 10 mg via INTRAVENOUS
  Administered 2020-11-23 (×2): 15 mg via INTRAVENOUS

## 2020-11-23 NOTE — Anesthesia Preprocedure Evaluation (Addendum)
Anesthesia Evaluation  Patient identified by MRN, date of birth, ID band Patient awake    Reviewed: Allergy & Precautions, NPO status , Patient's Chart, lab work & pertinent test results  History of Anesthesia Complications Negative for: history of anesthetic complications  Airway Mallampati: II  TM Distance: >3 FB Neck ROM: Full    Dental   Pulmonary neg pulmonary ROS,    Pulmonary exam normal        Cardiovascular negative cardio ROS Normal cardiovascular exam     Neuro/Psych negative neurological ROS  negative psych ROS   GI/Hepatic negative GI ROS, Neg liver ROS,   Endo/Other  negative endocrine ROS  Renal/GU negative Renal ROS   Prostate cancer    Musculoskeletal negative musculoskeletal ROS (+)   Abdominal   Peds  Hematology negative hematology ROS (+)   Anesthesia Other Findings  Echo 10/18/20: EF 60-65%, bileaflet severe MVP, likely severe MR, normal RV function, mod LAD  Reproductive/Obstetrics                            Anesthesia Physical Anesthesia Plan  ASA: III  Anesthesia Plan: MAC   Post-op Pain Management:    Induction: Intravenous  PONV Risk Score and Plan: 1 and Propofol infusion, TIVA and Treatment may vary due to age or medical condition  Airway Management Planned: Natural Airway, Nasal Cannula and Simple Face Mask  Additional Equipment: None  Intra-op Plan:   Post-operative Plan:   Informed Consent: I have reviewed the patients History and Physical, chart, labs and discussed the procedure including the risks, benefits and alternatives for the proposed anesthesia with the patient or authorized representative who has indicated his/her understanding and acceptance.       Plan Discussed with:   Anesthesia Plan Comments:         Anesthesia Quick Evaluation

## 2020-11-23 NOTE — Interval H&P Note (Signed)
History and Physical Interval Note:  11/23/2020 9:27 AM  Thomas [name]  has presented today for surgery, with the diagnosis of SEVERE MITRAL REGURGITATION.  The various methods of treatment have been discussed with the patient and family. After consideration of risks, benefits and other options for treatment, the patient has consented to  Procedure(s): TRANSESOPHAGEAL ECHOCARDIOGRAM (TEE) (N/A) as a surgical intervention.  The patient's history has been reviewed, patient examined, no change in status, stable for surgery.  I have reviewed the patient's chart and labs.  Questions were answered to the patient's satisfaction.    TEE for severe MR. NPO.   [place] T. Audie Box, MD, [place]  [address], [place], [place] [address] [phone]  9:27 AM

## 2020-11-23 NOTE — Discharge Instructions (Signed)

## 2020-11-23 NOTE — CV Procedure (Signed)
    TRANSESOPHAGEAL ECHOCARDIOGRAM   NAME:  Thomas [name]    MRN: [id number] DOB:  [birth year]    ADMIT DATE: 11/23/2020  INDICATIONS: Severe MR  PROCEDURE:   Informed consent was obtained prior to the procedure. The risks, benefits and alternatives for the procedure were discussed and the patient comprehended these risks.  Risks include, but are not limited to, cough, sore throat, vomiting, nausea, somnolence, esophageal and stomach trauma or perforation, bleeding, low blood pressure, aspiration, pneumonia, infection, trauma to the teeth and death.    Procedural time out performed. The oropharynx was anesthetized with topical 1% benzocaine.    Anesthesia was administered by Dr. Kerin Perna.  The patient was administered a total of 390 mg propofol to achieve and maintain moderate conscious sedation.  The patient's heart rate, blood pressure, and oxygen saturation are monitored continuously during the procedure. The period of conscious sedation is 35 minutes, of which I was present face-to-face 100% of this time.   The transesophageal probe was inserted in the esophagus and stomach without difficulty and multiple views were obtained.   COMPLICATIONS:    There were no immediate complications.  KEY FINDINGS:  1. Severe MR. Prolapse of A3/P3 and flail P2 segment. Barlow's valve. 2. Moderate TR due to septal leaflet prolapse.  3. Normal LVEF, 65%.  4. Full report to follow. 5. Further management per primary team.   Addison Naegeli. Audie Box, [place]  [address] [place], [place] [address] [phone]  3:33 PM

## 2020-11-23 NOTE — Anesthesia Procedure Notes (Signed)
Procedure Name: MAC Date/Time: 11/23/2020 1:57 PM Performed by: Barrington Ellison, CRNA Pre-anesthesia Checklist: Patient identified, Emergency Drugs available, Suction available, Patient being monitored and Timeout performed Patient Re-evaluated:Patient Re-evaluated prior to induction Oxygen Delivery Method: Nasal cannula

## 2020-11-23 NOTE — Progress Notes (Signed)
  Echocardiogram Echocardiogram Transesophageal has been performed.  Jennette Dubin 11/23/2020, 4:25 PM

## 2020-11-23 NOTE — Transfer of Care (Signed)
Immediate Anesthesia Transfer of Care Note  Patient: Thomas [name]  Procedure(s) Performed: TRANSESOPHAGEAL ECHOCARDIOGRAM (TEE) (N/A ) BUBBLE STUDY  Patient Location: Endoscopy Unit  Anesthesia Type:MAC  Level of Consciousness: lethargic and responds to stimulation  Airway & Oxygen Therapy: Patient Spontanous Breathing  Post-op Assessment: Report given to RN  Post vital signs: Reviewed and stable  Last Vitals:  Vitals Value Taken Time  BP 106/50 11/23/20 1538  Temp    Pulse 76 11/23/20 1539  Resp 16 11/23/20 1539  SpO2 95 % 11/23/20 1539  Vitals shown include unvalidated device data.  Last Pain:  Vitals:   11/23/20 0930  TempSrc: Oral  PainSc: 0-No pain         Complications: No complications documented.

## 2020-11-24 ENCOUNTER — Encounter (HOSPITAL_COMMUNITY): Payer: Self-pay | Admitting: Cardiovascular Disease

## 2020-11-24 ENCOUNTER — Other Ambulatory Visit: Payer: Self-pay | Admitting: *Deleted

## 2020-11-24 DIAGNOSIS — I340 Nonrheumatic mitral (valve) insufficiency: Secondary | ICD-10-CM

## 2020-11-24 NOTE — Anesthesia Postprocedure Evaluation (Signed)
Anesthesia Post Note  Patient: Thomas [name]  Procedure(s) Performed: TRANSESOPHAGEAL ECHOCARDIOGRAM (TEE) (N/A ) BUBBLE STUDY     Patient location during evaluation: Endoscopy Anesthesia Type: MAC Level of consciousness: awake and alert Pain management: pain level controlled Vital Signs Assessment: post-procedure vital signs reviewed and stable Respiratory status: spontaneous breathing, nonlabored ventilation and respiratory function stable Cardiovascular status: blood pressure returned to baseline and stable Postop Assessment: no apparent nausea or vomiting Anesthetic complications: no   No complications documented.  Last Vitals:  Vitals:   11/23/20 1550 11/23/20 1600  BP: (!) 116/46 (!) 118/52  Pulse: 75 80  Resp: 19 (!) 22  Temp:    SpO2: 100% 95%    Last Pain:  Vitals:   11/23/20 1600  TempSrc:   PainSc: 0-No pain   Pain Goal:                   Lidia Collum

## 2020-11-24 NOTE — Progress Notes (Signed)
°  Severe mitral valve regurgitation. Please refer to cardiothoracic surgery for further evaluation. Candee Furbish, MD  Written by [name], MD on 11/24/2020 4:07 PM EST  Order was placed for referral.  Pt has been scheduled with Dr Roxy Manns 11/28/2020 at 1 pm.

## 2020-11-28 ENCOUNTER — Other Ambulatory Visit: Payer: Self-pay | Admitting: *Deleted

## 2020-11-28 ENCOUNTER — Institutional Professional Consult (permissible substitution): Payer: PPO | Admitting: Thoracic Surgery (Cardiothoracic Vascular Surgery)

## 2020-11-28 ENCOUNTER — Encounter: Payer: Self-pay | Admitting: Thoracic Surgery (Cardiothoracic Vascular Surgery)

## 2020-11-28 ENCOUNTER — Other Ambulatory Visit: Payer: Self-pay

## 2020-11-28 VITALS — BP 147/74 | HR 67 | Resp 20 | Ht 70.0 in | Wt 173.0 lb

## 2020-11-28 DIAGNOSIS — I340 Nonrheumatic mitral (valve) insufficiency: Secondary | ICD-10-CM | POA: Diagnosis not present

## 2020-11-28 DIAGNOSIS — I059 Rheumatic mitral valve disease, unspecified: Secondary | ICD-10-CM | POA: Diagnosis not present

## 2020-11-28 DIAGNOSIS — Z01818 Encounter for other preprocedural examination: Secondary | ICD-10-CM

## 2020-11-28 DIAGNOSIS — I71019 Dissection of thoracic aorta, unspecified: Secondary | ICD-10-CM

## 2020-11-28 DIAGNOSIS — I7101 Dissection of thoracic aorta: Secondary | ICD-10-CM

## 2020-11-28 NOTE — Patient Instructions (Signed)
Continue all previous medications without any changes at this time  

## 2020-11-28 NOTE — Progress Notes (Signed)
Thomas [name] [address]       [place] [address]             [id number]     CARDIOTHORACIC SURGERY CONSULTATION REPORT  Referring Provider is Thomas Pain, MD PCP is Thomas Dials, MD  Chief Complaint  Patient presents with  . Mitral Regurgitation    Initial surgical consult, TEE 1/10, ECHO 2/14    HPI:  Patient is a 75 year old male who has been referred for surgical consultation to discuss treatment options for management of mitral valve prolapse with severe mitral regurgitation.  Patient states that he was first noted to have a heart murmur in 1969 when he joined the [employer].  He was diagnosed with mitral valve prolapse.  Approximately 10 years ago his primary care physician noted that his heart murmur had become more prominent and an echocardiogram was performed confirming the presence of mitral valve prolapse with moderate mitral regurgitation.  The patient has been followed carefully ever since, most recently by Dr. Marlou [name].  Follow-up transthoracic echocardiogram performed October 18, 2020 revealed significant progression of disease with bileaflet prolapse and likely severe mitral regurgitation.  There remain normal left ventricular size and systolic function with moderate left atrial enlargement.  The patient subsequently underwent transesophageal echocardiogram November 23, 2020 which confirmed the presence of myxomatous degenerative disease with bileaflet prolapse and severe mitral regurgitation.  Cardiothoracic surgical consultation was requested.  Patient is single and lives alone locally in [place].  He has 1 adult son who lives nearby and is supportive.  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  He does not exercise on a regular basis but he remains active physically and walks his dogs on a daily basis, in excess of 1 mile each time.  He specifically denies any significant symptoms of  exertional shortness of breath or fatigue.  He has not noticed any changes in his exercise tolerance.  He has never had any significant chest [name] or chest tightness either with activity or at rest.  He denies any history of PND, orthopnea, or lower extremity edema.  He has not had palpitations or significant dizzy spells.  He has no documented history of atrial fibrillation.  Past Medical History:  Diagnosis Date  . Bilateral inguinal hernia   . BPH (benign prostatic hyperplasia)   . Cancer Thomas [name])    prostate cancer; per patient being followed by Dr Thomas [name] at [name] urology ; currently no chemotherapy   . Mitral regurgitation   . Mitral valve prolapse    now seeing Dr Thomas [name]     Past Surgical History:  Procedure Laterality Date  . BUBBLE STUDY  11/23/2020   Procedure: BUBBLE STUDY;  Surgeon: Thomas Rile, MD;  Location: [facility];  Service: Cardiovascular;;  . FEMORAL HERNIA REPAIR Right 07/22/2017   Procedure: HERNIA REPAIR FEMORAL;  Surgeon: Thomas Confer, MD;  Location: [facility];  Service: General;  Laterality: Right;  . INGUINAL HERNIA REPAIR N/A 07/22/2017   Procedure: LAPAROSCOPIC BILATERAL INGUINAL HERNIA REPAIR WITH MESH;  Surgeon: Thomas Confer, MD;  Location: [facility];  Service: General;  Laterality: N/A;  . INSERTION OF MESH Bilateral 07/22/2017   Procedure: INSERTION OF MESH;  Surgeon: Thomas Confer, MD;  Location: [facility];  Service: General;  Laterality: Bilateral;  . TEE WITHOUT CARDIOVERSION N/A 11/23/2020   Procedure: TRANSESOPHAGEAL ECHOCARDIOGRAM (TEE);  Surgeon: Thomas Rile, MD;  Location:  [facility];  Service: Cardiovascular;  Laterality: N/A;  . TONSILLECTOMY AND ADENOIDECTOMY      History reviewed. No pertinent family history.  Social History   Socioeconomic History  . Marital status: Divorced    Spouse name: Not on file  . Number of children: Not on file  . Years of education: Not on file  . Highest education level: Not on file   Occupational History  . Not on file  Tobacco Use  . Smoking status: Never Smoker  . Smokeless tobacco: Never Used  Vaping Use  . Vaping Use: Never used  Substance and Sexual Activity  . Alcohol use: Yes    Comment: seldom   . Drug use: No  . Sexual activity: Not on file  Other Topics Concern  . Not on file  Social History Narrative  . Not on file   Social Determinants of Health   Financial Resource Strain: Not on file  Food Insecurity: Not on file  Transportation Needs: Not on file  Physical Activity: Not on file  Stress: Not on file  Social Connections: Not on file  Intimate Partner Violence: Not on file    Current Outpatient Medications  Medication Sig Dispense Refill  . Calcium Carb-Cholecalciferol (CALCIUM 600/VITAMIN D3) 600-800 MG-UNIT TABS Take 2 tablets by mouth daily.    . Magnesium Oxide 250 MG TABS Take 250 mg by mouth daily.    . Multiple Vitamins-Minerals (MULTIVITAMIN WITH MINERALS) tablet Take 1 tablet by mouth daily.     No current facility-administered medications for this visit.    No Known Allergies    Review of Systems:   General:  Normal appetite, normal energy, no weight gain, no weight loss, no fever  Cardiac:  no chest [name] with exertion, no chest [name] at rest, no SOB with exertion, no resting SOB, no PND, no orthopnea, no palpitations, no arrhythmia, no atrial fibrillation, no LE edema, no dizzy spells, no syncope  Respiratory:  no shortness of breath, no home oxygen, no productive cough, + dry cough, no bronchitis, no wheezing, no hemoptysis, no asthma, no [name] with inspiration or cough, no sleep apnea, no CPAP at night  GI:   no difficulty swallowing, no reflux, no frequent heartburn, no hiatal hernia, no abdominal [name], no constipation, no diarrhea, no hematochezia, no hematemesis, no melena  GU:   no dysuria,  no frequency, no urinary tract infection, no hematuria, no enlarged prostate, no kidney stones, no kidney disease  Vascular:  no  [name] suggestive of claudication, no [name] in feet, no leg cramps, no varicose veins, no DVT, no non-healing foot ulcer  Neuro:   no stroke, no TIA's, no seizures, no headaches, no temporary blindness one eye,  no slurred speech, no peripheral neuropathy, no chronic [name], no instability of gait, no memory/cognitive dysfunction  Musculoskeletal: no arthritis, no joint swelling, no myalgias, no difficulty walking, normal mobility   Skin:   no rash, no itching, no skin infections, no pressure sores or ulcerations  Psych:   no anxiety, no depression, no nervousness, no unusual recent stress  Eyes:   no blurry vision, no floaters, no recent vision changes, + wears glasses or contacts  ENT:   no hearing loss, no loose or painful teeth, no dentures, last saw dentist October 2021  Hematologic:  no easy bruising, no abnormal bleeding, no clotting disorder, no frequent epistaxis  Endocrine:  no diabetes, does not check CBG's at home     Physical Exam:   BP (!) 147/74 (  BP Location: Left Arm, Patient Position: Sitting)   Pulse 67   Resp 20   Ht 5' 10" (1.778 m)   Wt 173 lb (78.5 kg)   SpO2 99% Comment: RA with mask on  BMI 24.82 kg/m   General:    well-appearing  HEENT:  Unremarkable   Neck:   no JVD, no bruits, no adenopathy   Chest:   clear to auscultation, symmetrical breath sounds, no wheezes, no rhonchi   CV:   RRR, grade IV/VI holosystolic murmur   Abdomen:  soft, non-tender, no masses   Extremities:  warm, well-perfused, pulses palpable, no LE edema  Rectal/GU  Deferred  Neuro:   Grossly non-focal and symmetrical throughout  Skin:   Clean and dry, no rashes, no breakdown   Diagnostic Tests:   ECHOCARDIOGRAM REPORT       Patient Name:  Thomas [name] Date of Exam: 10/18/2020  Medical Rec #: [id number]     Height:  Accession #:  [id number]    Weight:  Date of Birth: 10/01/1946     BSA:  Patient Age:  74 years     BP:      142/71 mmHg  Patient  Gender: M         HR:      65 bpm.  Exam Location: [address]   Procedure: 2D Echo, Cardiac Doppler and Color Doppler                 MODIFIED REPORT:  This report was modified by Brian Crenshaw MD on 10/18/2020 due to change.  Indications:   I05.9 Mitral valve disorder    History:     Patient has prior history of Echocardiogram examinations,  most          recent 07/03/2019. Cancer; Mitral Valve Prolapse.    Sonographer:   Tammie Crouch RCS  Referring Phys: [id number] MARK C SKAINS  Diagnosing Phys: Brian Crenshaw MD   IMPRESSIONS    1. Normal LV function; bileaflet MVP (posterior leaflet > anterior  leaflet); likely severe MR but difficult to quantitate; suggest TEE to  further assess.  2. Left ventricular ejection fraction, by estimation, is 60 to 65%. The  left ventricle has normal function. The left ventricle has no regional  wall motion abnormalities. The left ventricular internal cavity size was  mildly dilated. Left ventricular  diastolic parameters are indeterminate. Elevated left atrial pressure.  3. Right ventricular systolic function is normal. The right ventricular  size is normal.  4. Left atrial size was moderately dilated.  5. The mitral valve is myxomatous. Moderate to severe mitral valve  regurgitation. No evidence of mitral stenosis. There is severe prolapse of  both leaflets of the mitral valve.  6. The aortic valve is tricuspid. Aortic valve regurgitation is trivial.  Mild aortic valve sclerosis is present, with no evidence of aortic valve  stenosis.  7. The inferior vena cava is normal in size with greater than 50%  respiratory variability, suggesting right atrial pressure of 3 mmHg.   FINDINGS  Left Ventricle: Left ventricular ejection fraction, by estimation, is 60  to 65%. The left ventricle has normal function. The left ventricle has no  regional wall motion abnormalities. The left ventricular  internal cavity  size was mildly dilated. There is  no left ventricular hypertrophy. Left ventricular diastolic parameters  are indeterminate. Elevated left atrial pressure.   Right Ventricle: The right ventricular size is normal. Right ventricular  systolic function is normal.     Left Atrium: Left atrial size was moderately dilated.   Right Atrium: Right atrial size was normal in size.   Pericardium: There is no evidence of pericardial effusion.   Mitral Valve: The mitral valve is myxomatous. There is severe prolapse of  both leaflets of the mitral valve. Mild mitral annular calcification.  Moderate to severe mitral valve regurgitation. No evidence of mitral valve  stenosis.   Tricuspid Valve: The tricuspid valve is normal in structure. Tricuspid  valve regurgitation is trivial. No evidence of tricuspid stenosis.   Aortic Valve: The aortic valve is tricuspid. Aortic valve regurgitation is  trivial. Mild aortic valve sclerosis is present, with no evidence of  aortic valve stenosis.   Pulmonic Valve: The pulmonic valve was normal in structure. Pulmonic valve  regurgitation is mild. No evidence of pulmonic stenosis.   Aorta: The aortic root is normal in size and structure.   Venous: The inferior vena cava is normal in size with greater than 50%  respiratory variability, suggesting right atrial pressure of 3 mmHg.   IAS/Shunts: No atrial level shunt detected by color flow Doppler.   Additional Comments: Normal LV function; bileaflet MVP (posterior leaflet  > anterior leaflet); likely severe MR but difficult to quantitate; suggest  TEE to further assess.   Kirk Ruths MD  Electronically signed by Kirk Ruths MD  Signature Date/Time: 10/18/2020/4:42:58 PM          TRANSESOPHOGEAL ECHO REPORT       Patient Name:  Thomas [name] Date of Exam: 11/23/2020  Medical Rec #: [id number]     Height:    70.0 in  Accession #:  [id number]    Weight:     177.0 lb  Date of Birth: 23-Feb-1946     BSA:     1.982 m  Patient Age:  66 years     BP:      118/52 mmHg  Patient Gender: M         HR:      78 bpm.  Exam Location: Inpatient   Procedure: Transesophageal Echo, 3D Echo, Color Doppler and Cardiac  Doppler   Indications:   Severe Mitral Regurgitation    History:     Patient has prior history of Echocardiogram examinations,  most          recent 10/18/2020. Mitral Valve Prolapse.    Sonographer:   Mikki Santee RDCS (AE)  Referring Phys: [id number] Thomas [name]  Diagnosing Phys: Eleonore Chiquito MD   PROCEDURE: After discussion of the risks and benefits of a TEE, an  informed consent was obtained from the patient. TEE procedure time was 35  minutes. The transesophogeal probe was passed without difficulty through  the esophogus of the patient. Imaged  were obtained with the patient in a left lateral decubitus position. Local  oropharyngeal anesthetic was provided with Cetacaine. Sedation performed  by different physician. The patient was monitored while under deep  sedation. Anesthestetic sedation was  provided intravenously by Anesthesiology: 390mg  of Propofol. Image quality  was excellent. The patient's vital signs; including heart rate, blood  pressure, and oxygen saturation; remained stable throughout the procedure.  The patient developed no  complications during the procedure.   IMPRESSIONS    1. 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 There is severe prolapse of the P1/P2 segment without  apparent flail segment. The P1/P2 segment 2D ERO is 0.81 cm2, R vol 130  cc. The P1/P2 segment 3D VCA  is  ~0.80 cm2. A second jet is present due to incomplete coaptation of the  A3/P3 segments with 2D ERO 0.15 cm2, R vol 17 cc. The 3D VCA of this is  0.23 cm2. In total, by 2D ERO ~0.96 cm2, R vol 147 cc. R vol by VTI is 148  cc. RF  ~67%. There is systolic flow  reversal in the R upper and left upper pulmonary veins. Overall, severe  mitral regurgitation is present. The mitral valve is myxomatous. Severe  mitral valve regurgitation. No evidence of mitral stenosis.  2. 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 There is mild  TR related to leaflet prolapse that was difficult to quantitate. The  tricuspid valve is myxomatous.  3. Eccentric pulmonary regurgitation is present possibly due to leaflet  prolapse vs commissural. Pulmonary regurgitation is mild.  4. Left ventricular ejection fraction, by estimation, is 60 to 65%. The  left ventricle has normal function.  5. Right ventricular systolic function is normal. The right ventricular  size is normal.  6. Left atrial size was mild to moderately dilated. No left atrial/left  atrial appendage thrombus was detected. The LAA emptying velocity was 70  cm/s.  7. The aortic valve is tricuspid. Aortic valve regurgitation is trivial.  No aortic stenosis is present.  8. There is mild (Grade II) layered plaque involving the descending  aorta.   FINDINGS  Left Ventricle: Left ventricular ejection fraction, by estimation, is 60  to 65%. The left ventricle has normal function. The left ventricular  internal cavity size was normal in size.   Right Ventricle: The right ventricular size is normal. No increase in  right ventricular wall thickness. Right ventricular systolic function is  normal.   Left Atrium: Left atrial size was mild to moderately dilated. No left  atrial/left atrial appendage thrombus was detected. The LAA emptying  velocity was 70 cm/s.   Right Atrium: Right atrial size was normal in size.   Pericardium: Trivial pericardial effusion is present.   Mitral Valve: The mitral valve is myxomatous with bileaflet prolapse  consistent with Barlow's disease. There is severe prolapse of the P1/P2  segment without apparent flail segment. The  P1/P2 segment 2D ERO is 0.81  cm2, R vol 130 cc. The P1/P2 segment 3D  VCA is ~0.80 cm2. A second jet is present due to incomplete coaptation of  the A3/P3 segments with 2D ERO 0.15 cm2, R vol 17 cc. The 3D VCA of this  is 0.23 cm2. In total, by 2D ERO ~0.96 cm2, R vol 147 cc. R vol by VTI is  148 cc. RF ~67%. There is  systolic flow reversal in the R upper and left upper pulmonary veins.  Overall, severe mitral regurgitation is present. The mitral valve is  myxomatous. Severe mitral valve regurgitation. No evidence of mitral valve  stenosis.   Tricuspid Valve: The tricuspid valve is myxomatous with leaflet prolapse.  There is mild TR related to leaflet prolapse that was difficult to  quantitate. The tricuspid valve is myxomatous. Tricuspid valve  regurgitation is mild . No evidence of tricuspid  stenosis.   Aortic Valve: The aortic valve is tricuspid. Aortic valve regurgitation is  trivial. No aortic stenosis is present.   Pulmonic Valve: Eccentric pulmonary regurgitation is present possibly due  to leaflet prolapse vs commissural. Pulmonary regurgitation is mild. The  pulmonic valve was grossly normal. Pulmonic valve regurgitation is mild.  No evidence of pulmonic stenosis.   Aorta: The aortic root is  normal in size and structure. There is mild  (Grade II) layered plaque involving the descending aorta.   Venous: A pattern of systolic flow reversal, suggestive of severe mitral  regurgitation is recorded from the left upper pulmonary vein and the right  upper pulmonary vein.   IAS/Shunts: The atrial septum is grossly normal.     LEFT VENTRICLE  PLAX 2D  LVOT diam:   2.30 cm  LV SV:     73  LV SV Index:  37    3D Volume EF  LVOT Area:   4.15 cm LV 3D EDV:  108.65 ml     AORTIC VALVE  LVOT Vmax:  98.39 cm/s  LVOT Vmean: 67.750 cm/s  LVOT VTI:  0.175 m    AORTA  Ao Root diam: 3.45 cm  Ao Asc diam: 2.96 cm   MR Peak grad:  135.0 mmHg TRICUSPID  VALVE  MR Mean grad:  74.7 mmHg  TR Peak grad:  23.2 mmHg  MR Vmax:     581.00 cm/s TR Vmax:    241.00 cm/s  MR Vmean:    395.3 cm/s  MR PISA:     9.05 cm  SHUNTS  MR PISA Eff ROA: 60 mm   Systemic VTI: 0.18 m  MR PISA Radius: 1.20 cm   Systemic Diam: 2.30 cm   Eleonore Chiquito MD  Electronically signed by Eleonore Chiquito MD  Signature Date/Time: 11/23/2020/10:51:18 PM      Impression:  Patient has mitral valve prolapse with stage C severe asymptomatic primary mitral regurgitation.  Although he does not exercise on a regular basis the patient remains reasonably active physically and he specifically denies any symptoms of exertional shortness of breath, chest discomfort, or fatigue.  I have personally reviewed the patient's recent transthoracic and transesophageal echocardiograms.  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  There is severe prolapse involving the P2 and P3 segments of the posterior leaflet.  There may be at least 1 ruptured primary chordae tendinae.  There does not appear to be significant calcification.  Left ventricular size and systolic function remains normal.  There is moderate left atrial enlargement.  No other significant abnormalities are noted.  Options include continued close observation on medical therapy versus elective mitral valve repair.  Based upon review of the patient's recent echocardiograms I feel there is better than 95% likelihood of successful valve repair.  Operative risks should be quite low with the patient's only significant risk factor being his age.  The patient may be a good candidate for minimally invasive approach for surgery although there is reported history of left-sided vena cava noted in his past medical history.  The patient does not have significant aortic insufficiency.   Plan:  The patient was counseled at  length regarding  diagnosis of severe primary mitral regurgitation.  We reviewed the results of their diagnostic tests including images from the most recent echocardiogram.  We discussed the natural history of mitral regurgitation as well as alternative treatment strategies.  We discussed the impact of his age, current state of health, and apparent lack of significant comorbid medical problems on clinical decision making.  We went on to discuss the indications, risks and potential benefits of mitral valve repair as well as the timing of surgical intervention.  The rationale for elective surgery has been explained, including a comparison between surgery and continued medical therapy with close follow-up.  The likelihood of successful  and durable mitral valve repair has been discussed with particular reference to the findings of the most recent echocardiogram.  Based upon these findings and previous experience, I have quoted a greater than 95 percent likelihood of successful valve repair with less than 1 percent risk of mortality or major morbidity.  Alternative surgical approaches have been discussed including a comparison between conventional sternotomy and minimally-invasive techniques.  The relative risks and benefits of each have been reviewed as they pertain to the patient's specific circumstances, and expectations for the patient's postoperative convalescence has been discussed.   The patient desires to proceed with further diagnostic testing and possible elective surgical intervention in the near future.   As a next step the patient will undergo left and right heart catheterization.  He will also undergo CT angiography to evaluate the feasibility of peripheral cannulation for surgery.  Patient will return to our office in approximately 3 weeks to review the results of these tests and discuss elective surgical intervention.     I spent in excess of 90 minutes during the conduct of this office consultation  and >50% of this time involved direct face-to-face encounter with the patient for counseling and/or coordination of their care.    Valentina Gu. Roxy Manns, MD 11/28/2020 1:18 PM

## 2020-11-28 NOTE — H&P (View-Only) (Signed)
[name]       [place] [address]             [phone]     CARDIOTHORACIC SURGERY CONSULTATION REPORT  Referring Provider is [name], MD PCP is [name], MD  Chief Complaint  Patient presents with  . Mitral Regurgitation    Initial surgical consult, TEE 1/10, ECHO 2/14    HPI:  Patient is a 75 year old male who has been referred for surgical consultation to discuss treatment options for management of mitral valve prolapse with severe mitral regurgitation.  Patient states that he was first noted to have a heart murmur in 1969 when he joined the [employer].  He was diagnosed with mitral valve prolapse.  Approximately 10 years ago his primary care physician noted that his heart murmur had become more prominent and an echocardiogram was performed confirming the presence of mitral valve prolapse with moderate mitral regurgitation.  The patient has been followed carefully ever since, most recently by Dr. [name].  Follow-up transthoracic echocardiogram performed October 18, 2020 revealed significant progression of disease with bileaflet prolapse and likely severe mitral regurgitation.  There remain normal left ventricular size and systolic function with moderate left atrial enlargement.  The patient subsequently underwent transesophageal echocardiogram November 23, 2020 which confirmed the presence of myxomatous degenerative disease with bileaflet prolapse and severe mitral regurgitation.  Cardiothoracic surgical consultation was requested.  Patient is single and lives alone locally in [place].  He has 1 adult son who lives nearby and is supportive.  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  He does not exercise on a regular basis but he remains active physically and walks his dogs on a daily basis, in excess of 1 mile each time.  He specifically denies any significant symptoms of  exertional shortness of breath or fatigue.  He has not noticed any changes in his exercise tolerance.  He has never had any significant chest pain or chest tightness either with activity or at rest.  He denies any history of PND, orthopnea, or lower extremity edema.  He has not had palpitations or significant dizzy spells.  He has no documented history of atrial fibrillation.  Past Medical History:  Diagnosis Date  . Bilateral inguinal hernia   . BPH (benign prostatic hyperplasia)   . Cancer [name])    prostate cancer; per patient being followed by Dr [name] at [name] urology ; currently no chemotherapy   . Mitral regurgitation   . Mitral valve prolapse    now seeing [name]     Past Surgical History:  Procedure Laterality Date  . BUBBLE STUDY  11/23/2020   Procedure: BUBBLE STUDY;  Surgeon: [name], MD;  Location: [facility];  Service: Cardiovascular;;  . FEMORAL HERNIA REPAIR Right 07/22/2017   Procedure: HERNIA REPAIR FEMORAL;  Surgeon: Jackolyn Confer, MD;  Location: [facility];  Service: General;  Laterality: Right;  . INGUINAL HERNIA REPAIR N/A 07/22/2017   Procedure: LAPAROSCOPIC BILATERAL INGUINAL HERNIA REPAIR WITH MESH;  Surgeon: Jackolyn Confer, MD;  Location: [facility];  Service: General;  Laterality: N/A;  . INSERTION OF MESH Bilateral 07/22/2017   Procedure: INSERTION OF MESH;  Surgeon: Jackolyn Confer, MD;  Location: [facility];  Service: General;  Laterality: Bilateral;  . TEE WITHOUT CARDIOVERSION N/A 11/23/2020   Procedure: TRANSESOPHAGEAL ECHOCARDIOGRAM (TEE);  Surgeon: [name], MD;  Location:  [facility];  Service: Cardiovascular;  Laterality: N/A;  . TONSILLECTOMY AND ADENOIDECTOMY      History reviewed. No pertinent family history.  Social History   Socioeconomic History  . Marital status: Divorced    Spouse name: Not on file  . Number of children: Not on file  . Years of education: Not on file  . Highest education level: Not on file   Occupational History  . Not on file  Tobacco Use  . Smoking status: Never Smoker  . Smokeless tobacco: Never Used  Vaping Use  . Vaping Use: Never used  Substance and Sexual Activity  . Alcohol use: Yes    Comment: seldom   . Drug use: No  . Sexual activity: Not on file  Other Topics Concern  . Not on file  Social History Narrative  . Not on file   Social Determinants of Health   Financial Resource Strain: Not on file  Food Insecurity: Not on file  Transportation Needs: Not on file  Physical Activity: Not on file  Stress: Not on file  Social Connections: Not on file  Intimate Partner Violence: Not on file    Current Outpatient Medications  Medication Sig Dispense Refill  . Calcium Carb-Cholecalciferol (CALCIUM 600/VITAMIN D3) 600-800 MG-UNIT TABS Take 2 tablets by mouth daily.    . Magnesium Oxide 250 MG TABS Take 250 mg by mouth daily.    . Multiple Vitamins-Minerals (MULTIVITAMIN WITH MINERALS) tablet Take 1 tablet by mouth daily.     No current facility-administered medications for this visit.    No Known Allergies    Review of Systems:   General:  Normal appetite, normal energy, no weight gain, no weight loss, no fever  Cardiac:  no chest pain with exertion, no chest pain at rest, no SOB with exertion, no resting SOB, no PND, no orthopnea, no palpitations, no arrhythmia, no atrial fibrillation, no LE edema, no dizzy spells, no syncope  Respiratory:  no shortness of breath, no home oxygen, no productive cough, + dry cough, no bronchitis, no wheezing, no hemoptysis, no asthma, no pain with inspiration or cough, no sleep apnea, no CPAP at night  GI:   no difficulty swallowing, no reflux, no frequent heartburn, no hiatal hernia, no abdominal pain, no constipation, no diarrhea, no hematochezia, no hematemesis, no melena  GU:   no dysuria,  no frequency, no urinary tract infection, no hematuria, no enlarged prostate, no kidney stones, no kidney disease  Vascular:  no  pain suggestive of claudication, no pain in feet, no leg cramps, no varicose veins, no DVT, no non-healing foot ulcer  Neuro:   no stroke, no TIA's, no seizures, no headaches, no temporary blindness one eye,  no slurred speech, no peripheral neuropathy, no chronic pain, no instability of gait, no memory/cognitive dysfunction  Musculoskeletal: no arthritis, no joint swelling, no myalgias, no difficulty walking, normal mobility   Skin:   no rash, no itching, no skin infections, no pressure sores or ulcerations  Psych:   no anxiety, no depression, no nervousness, no unusual recent stress  Eyes:   no blurry vision, no floaters, no recent vision changes, + wears glasses or contacts  ENT:   no hearing loss, no loose or painful teeth, no dentures, last saw dentist October 2021  Hematologic:  no easy bruising, no abnormal bleeding, no clotting disorder, no frequent epistaxis  Endocrine:  no diabetes, does not check CBG's at home     Physical Exam:   BP (!) 147/74 (  BP Location: Left Arm, Patient Position: Sitting)   Pulse 67   Resp 20   Ht 5\' 10"  (1.778 m)   Wt 173 lb (78.5 kg)   SpO2 99% Comment: RA with mask on  BMI 24.82 kg/m   General:    well-appearing  HEENT:  Unremarkable   Neck:   no JVD, no bruits, no adenopathy   Chest:   clear to auscultation, symmetrical breath sounds, no wheezes, no rhonchi   CV:   RRR, grade IV/VI holosystolic murmur   Abdomen:  soft, non-tender, no masses   Extremities:  warm, well-perfused, pulses palpable, no LE edema  Rectal/GU  Deferred  Neuro:   Grossly non-focal and symmetrical throughout  Skin:   Clean and dry, no rashes, no breakdown   Diagnostic Tests:   ECHOCARDIOGRAM REPORT       Patient Name:  Towan Ramsdell III Date of Exam: 10/18/2020  Medical Rec #: [id number]     Height:  Accession #:  [id number]    Weight:  Date of Birth: 10-12-1946     BSA:  Patient Age:  13 years     BP:      142/71 mmHg  Patient  Gender: M         HR:      65 bpm.  Exam Location: [address]   Procedure: 2D Echo, Cardiac Doppler and Color Doppler                 MODIFIED REPORT:  This report was modified by Kirk Ruths MD on 10/18/2020 due to change.  Indications:   I05.9 Mitral valve disorder    History:     Patient has prior history of Echocardiogram examinations,  most          recent 07/03/2019. Cancer; Mitral Valve Prolapse.    Sonographer:   Marygrace Drought RCS  Referring Phys: [id number] Thana Farr SKAINS  Diagnosing Phys: Kirk Ruths MD   IMPRESSIONS    1. Normal LV function; bileaflet MVP (posterior leaflet > anterior  leaflet); likely severe MR but difficult to quantitate; suggest TEE to  further assess.  2. Left ventricular ejection fraction, by estimation, is 60 to 65%. The  left ventricle has normal function. The left ventricle has no regional  wall motion abnormalities. The left ventricular internal cavity size was  mildly dilated. Left ventricular  diastolic parameters are indeterminate. Elevated left atrial pressure.  3. Right ventricular systolic function is normal. The right ventricular  size is normal.  4. Left atrial size was moderately dilated.  5. The mitral valve is myxomatous. Moderate to severe mitral valve  regurgitation. No evidence of mitral stenosis. There is severe prolapse of  both leaflets of the mitral valve.  6. The aortic valve is tricuspid. Aortic valve regurgitation is trivial.  Mild aortic valve sclerosis is present, with no evidence of aortic valve  stenosis.  7. The inferior vena cava is normal in size with greater than 50%  respiratory variability, suggesting right atrial pressure of 3 mmHg.   FINDINGS  Left Ventricle: Left ventricular ejection fraction, by estimation, is 60  to 65%. The left ventricle has normal function. The left ventricle has no  regional wall motion abnormalities. The left ventricular  internal cavity  size was mildly dilated. There is  no left ventricular hypertrophy. Left ventricular diastolic parameters  are indeterminate. Elevated left atrial pressure.   Right Ventricle: The right ventricular size is normal. Right ventricular  systolic function is normal.  Left Atrium: Left atrial size was moderately dilated.   Right Atrium: Right atrial size was normal in size.   Pericardium: There is no evidence of pericardial effusion.   Mitral Valve: The mitral valve is myxomatous. There is severe prolapse of  both leaflets of the mitral valve. Mild mitral annular calcification.  Moderate to severe mitral valve regurgitation. No evidence of mitral valve  stenosis.   Tricuspid Valve: The tricuspid valve is normal in structure. Tricuspid  valve regurgitation is trivial. No evidence of tricuspid stenosis.   Aortic Valve: The aortic valve is tricuspid. Aortic valve regurgitation is  trivial. Mild aortic valve sclerosis is present, with no evidence of  aortic valve stenosis.   Pulmonic Valve: The pulmonic valve was normal in structure. Pulmonic valve  regurgitation is mild. No evidence of pulmonic stenosis.   Aorta: The aortic root is normal in size and structure.   Venous: The inferior vena cava is normal in size with greater than 50%  respiratory variability, suggesting right atrial pressure of 3 mmHg.   IAS/Shunts: No atrial level shunt detected by color flow Doppler.   Additional Comments: Normal LV function; bileaflet MVP (posterior leaflet  > anterior leaflet); likely severe MR but difficult to quantitate; suggest  TEE to further assess.   Kirk Ruths MD  Electronically signed by Kirk Ruths MD  Signature Date/Time: 10/18/2020/4:42:58 PM          TRANSESOPHOGEAL ECHO REPORT       Patient Name:  Ubaldo Daywalt III Date of Exam: 11/23/2020  Medical Rec #: [id number]     Height:    70.0 in  Accession #:  [id number]    Weight:     177.0 lb  Date of Birth: 23-Feb-1946     BSA:     1.982 m  Patient Age:  66 years     BP:      118/52 mmHg  Patient Gender: M         HR:      78 bpm.  Exam Location: Inpatient   Procedure: Transesophageal Echo, 3D Echo, Color Doppler and Cardiac  Doppler   Indications:   Severe Mitral Regurgitation    History:     Patient has prior history of Echocardiogram examinations,  most          recent 10/18/2020. Mitral Valve Prolapse.    Sonographer:   Mikki Santee RDCS (AE)  Referring Phys: [id number] [name]  Diagnosing Phys: Eleonore Chiquito MD   PROCEDURE: After discussion of the risks and benefits of a TEE, an  informed consent was obtained from the patient. TEE procedure time was 35  minutes. The transesophogeal probe was passed without difficulty through  the esophogus of the patient. Imaged  were obtained with the patient in a left lateral decubitus position. Local  oropharyngeal anesthetic was provided with Cetacaine. Sedation performed  by different physician. The patient was monitored while under deep  sedation. Anesthestetic sedation was  provided intravenously by Anesthesiology: 390mg  of Propofol. Image quality  was excellent. The patient's vital signs; including heart rate, blood  pressure, and oxygen saturation; remained stable throughout the procedure.  The patient developed no  complications during the procedure.   IMPRESSIONS    1. 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 There is severe prolapse of the P1/P2 segment without  apparent flail segment. The P1/P2 segment 2D ERO is 0.81 cm2, R vol 130  cc. The P1/P2 segment 3D VCA  is  ~0.80 cm2. A second jet is present due to incomplete coaptation of the  A3/P3 segments with 2D ERO 0.15 cm2, R vol 17 cc. The 3D VCA of this is  0.23 cm2. In total, by 2D ERO ~0.96 cm2, R vol 147 cc. R vol by VTI is 148  cc. RF  ~67%. There is systolic flow  reversal in the R upper and left upper pulmonary veins. Overall, severe  mitral regurgitation is present. The mitral valve is myxomatous. Severe  mitral valve regurgitation. No evidence of mitral stenosis.  2. 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 There is mild  TR related to leaflet prolapse that was difficult to quantitate. The  tricuspid valve is myxomatous.  3. Eccentric pulmonary regurgitation is present possibly due to leaflet  prolapse vs commissural. Pulmonary regurgitation is mild.  4. Left ventricular ejection fraction, by estimation, is 60 to 65%. The  left ventricle has normal function.  5. Right ventricular systolic function is normal. The right ventricular  size is normal.  6. Left atrial size was mild to moderately dilated. No left atrial/left  atrial appendage thrombus was detected. The LAA emptying velocity was 70  cm/s.  7. The aortic valve is tricuspid. Aortic valve regurgitation is trivial.  No aortic stenosis is present.  8. There is mild (Grade II) layered plaque involving the descending  aorta.   FINDINGS  Left Ventricle: Left ventricular ejection fraction, by estimation, is 60  to 65%. The left ventricle has normal function. The left ventricular  internal cavity size was normal in size.   Right Ventricle: The right ventricular size is normal. No increase in  right ventricular wall thickness. Right ventricular systolic function is  normal.   Left Atrium: Left atrial size was mild to moderately dilated. No left  atrial/left atrial appendage thrombus was detected. The LAA emptying  velocity was 70 cm/s.   Right Atrium: Right atrial size was normal in size.   Pericardium: Trivial pericardial effusion is present.   Mitral Valve: The mitral valve is myxomatous with bileaflet prolapse  consistent with Barlow's disease. There is severe prolapse of the P1/P2  segment without apparent flail segment. The  P1/P2 segment 2D ERO is 0.81  cm2, R vol 130 cc. The P1/P2 segment 3D  VCA is ~0.80 cm2. A second jet is present due to incomplete coaptation of  the A3/P3 segments with 2D ERO 0.15 cm2, R vol 17 cc. The 3D VCA of this  is 0.23 cm2. In total, by 2D ERO ~0.96 cm2, R vol 147 cc. R vol by VTI is  148 cc. RF ~67%. There is  systolic flow reversal in the R upper and left upper pulmonary veins.  Overall, severe mitral regurgitation is present. The mitral valve is  myxomatous. Severe mitral valve regurgitation. No evidence of mitral valve  stenosis.   Tricuspid Valve: The tricuspid valve is myxomatous with leaflet prolapse.  There is mild TR related to leaflet prolapse that was difficult to  quantitate. The tricuspid valve is myxomatous. Tricuspid valve  regurgitation is mild . No evidence of tricuspid  stenosis.   Aortic Valve: The aortic valve is tricuspid. Aortic valve regurgitation is  trivial. No aortic stenosis is present.   Pulmonic Valve: Eccentric pulmonary regurgitation is present possibly due  to leaflet prolapse vs commissural. Pulmonary regurgitation is mild. The  pulmonic valve was grossly normal. Pulmonic valve regurgitation is mild.  No evidence of pulmonic stenosis.   Aorta: The aortic root is  normal in size and structure. There is mild  (Grade II) layered plaque involving the descending aorta.   Venous: A pattern of systolic flow reversal, suggestive of severe mitral  regurgitation is recorded from the left upper pulmonary vein and the right  upper pulmonary vein.   IAS/Shunts: The atrial septum is grossly normal.     LEFT VENTRICLE  PLAX 2D  LVOT diam:   2.30 cm  LV SV:     73  LV SV Index:  37    3D Volume EF  LVOT Area:   4.15 cm LV 3D EDV:  108.65 ml     AORTIC VALVE  LVOT Vmax:  98.39 cm/s  LVOT Vmean: 67.750 cm/s  LVOT VTI:  0.175 m    AORTA  Ao Root diam: 3.45 cm  Ao Asc diam: 2.96 cm   MR Peak grad:  135.0 mmHg TRICUSPID  VALVE  MR Mean grad:  74.7 mmHg  TR Peak grad:  23.2 mmHg  MR Vmax:     581.00 cm/s TR Vmax:    241.00 cm/s  MR Vmean:    395.3 cm/s  MR PISA:     9.05 cm  SHUNTS  MR PISA Eff ROA: 60 mm   Systemic VTI: 0.18 m  MR PISA Radius: 1.20 cm   Systemic Diam: 2.30 cm   Eleonore Chiquito MD  Electronically signed by Eleonore Chiquito MD  Signature Date/Time: 11/23/2020/10:51:18 PM      Impression:  Patient has mitral valve prolapse with stage C severe asymptomatic primary mitral regurgitation.  Although he does not exercise on a regular basis the patient remains reasonably active physically and he specifically denies any symptoms of exertional shortness of breath, chest discomfort, or fatigue.  I have personally reviewed the patient's recent transthoracic and transesophageal echocardiograms.  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  There is severe prolapse involving the P2 and P3 segments of the posterior leaflet.  There may be at least 1 ruptured primary chordae tendinae.  There does not appear to be significant calcification.  Left ventricular size and systolic function remains normal.  There is moderate left atrial enlargement.  No other significant abnormalities are noted.  Options include continued close observation on medical therapy versus elective mitral valve repair.  Based upon review of the patient's recent echocardiograms I feel there is better than 95% likelihood of successful valve repair.  Operative risks should be quite low with the patient's only significant risk factor being his age.  The patient may be a good candidate for minimally invasive approach for surgery although there is reported history of left-sided vena cava noted in his past medical history.  The patient does not have significant aortic insufficiency.   Plan:  The patient was counseled at  length regarding  diagnosis of severe primary mitral regurgitation.  We reviewed the results of their diagnostic tests including images from the most recent echocardiogram.  We discussed the natural history of mitral regurgitation as well as alternative treatment strategies.  We discussed the impact of his age, current state of health, and apparent lack of significant comorbid medical problems on clinical decision making.  We went on to discuss the indications, risks and potential benefits of mitral valve repair as well as the timing of surgical intervention.  The rationale for elective surgery has been explained, including a comparison between surgery and continued medical therapy with close follow-up.  The likelihood of successful  and durable mitral valve repair has been discussed with particular reference to the findings of the most recent echocardiogram.  Based upon these findings and previous experience, I have quoted a greater than 95 percent likelihood of successful valve repair with less than 1 percent risk of mortality or major morbidity.  Alternative surgical approaches have been discussed including a comparison between conventional sternotomy and minimally-invasive techniques.  The relative risks and benefits of each have been reviewed as they pertain to the patient's specific circumstances, and expectations for the patient's postoperative convalescence has been discussed.   The patient desires to proceed with further diagnostic testing and possible elective surgical intervention in the near future.   As a next step the patient will undergo left and right heart catheterization.  He will also undergo CT angiography to evaluate the feasibility of peripheral cannulation for surgery.  Patient will return to our office in approximately 3 weeks to review the results of these tests and discuss elective surgical intervention.     I spent in excess of 90 minutes during the conduct of this office consultation  and >50% of this time involved direct face-to-face encounter with the patient for counseling and/or coordination of their care.    Valentina Gu. Roxy Manns, MD 11/28/2020 1:18 PM

## 2020-11-29 ENCOUNTER — Telehealth: Payer: Self-pay | Admitting: *Deleted

## 2020-11-29 NOTE — Telephone Encounter (Signed)
Thomas [name] called with concerns over needing a dental crown prior to having his mitral valve repaired by Dr. Roxy Manns. Per Dr. Roxy Manns, pt told he can go ahead and have a crown done prior to following up with him on 12/19/20. Pt verbalized understanding. No further questions at this time.

## 2020-11-30 ENCOUNTER — Telehealth: Payer: Self-pay

## 2020-11-30 ENCOUNTER — Telehealth: Payer: Self-pay | Admitting: Cardiology

## 2020-11-30 NOTE — Telephone Encounter (Signed)
-----   Message from Rexene Alberts, MD sent at 11/30/2020 10:14 AM EST ----- Not urgent at all.  Thx  ----- Message ----- From: Sherren Mocha, MD Sent: 11/30/2020   6:41 AM EST To: Rexene Alberts, MD, Theodoro Parma, RN  Will do. Rounding next week so no lab time. Any urgency? If so we can set up for Gerald Stabs ----- Message ----- From: Rexene Alberts, MD Sent: 11/28/2020   2:13 PM EST To: Sherren Mocha, MD  Ronalee Belts - can you please schedule this patient for L+R cath when convenient?  Thx.  Cub

## 2020-11-30 NOTE — Telephone Encounter (Signed)
Per Dr. Roxy Manns, scheduled the patient for [facility] with Dr. Burt Knack 2/18. Instructions reviewed and sent thru [facility]. The patient was grateful for call and agrees with plan.

## 2020-11-30 NOTE — Telephone Encounter (Signed)
Thomas [name] is returning [name] call.

## 2020-11-30 NOTE — Telephone Encounter (Signed)
I did not need this encounter. °

## 2020-11-30 NOTE — Telephone Encounter (Signed)
Called to schedule L/[facility] with Dr. Burt Knack per Dr. Roxy Manns.   Left message to call back.

## 2020-12-01 ENCOUNTER — Telehealth: Payer: Self-pay | Admitting: Cardiology

## 2020-12-01 NOTE — Telephone Encounter (Signed)
   Primary Cardiologist: Candee Furbish, MD  Chart reviewed as part of pre-operative protocol coverage. Simple dental extractions are considered low risk procedures per guidelines and generally do not require any specific cardiac clearance. It is also generally accepted that for simple extractions and dental cleanings, there is no need to interrupt blood thinner therapy.   SBE prophylaxis is not required for the patient.  I will route this recommendation to the requesting party via Epic fax function and remove from pre-op pool.  Please call with questions.  Deberah Pelton, NP 12/01/2020, 11:59 AM

## 2020-12-01 NOTE — Telephone Encounter (Signed)
° °  [facility] Pre-operative Risk Assessment    HEARTCARE STAFF: - Please ensure there is not already an duplicate clearance open for this procedure. - Under Visit Info/Reason for Call, type in Other and utilize the format Clearance MM/DD/YY or Clearance TBD. Do not use dashes or single digits. - If request is for dental extraction, please clarify the # of teeth to be extracted.  Request for surgical clearance:  1. What type of surgery is being performed? Crown Cemented.  permament crown    2. When is this surgery scheduled? 02/10/20222  3. What type of clearance is required (medical clearance vs. Pharmacy clearance to hold med vs. Both)? Both   4. Are there any medications that need to be held prior to surgery and how long? They are needing to know if pt needs a premed prior to appt    5. Practice name and name of physician performing surgery? [facility]    6. What is the office phone number? [phone]   7.   What is the office fax number? [phone]  8.   Anesthesia type (None, local, MAC, general) ? None    Shana A Stovall 12/01/2020, 11:54 AM  _________________________________________________________________   (provider comments below)

## 2020-12-13 ENCOUNTER — Ambulatory Visit
Admission: RE | Admit: 2020-12-13 | Discharge: 2020-12-13 | Disposition: A | Discharge status: Home / Self Care | Payer: PPO | Source: Ambulatory Visit | Attending: Thoracic Surgery (Cardiothoracic Vascular Surgery) | Admitting: Thoracic Surgery (Cardiothoracic Vascular Surgery)

## 2020-12-13 DIAGNOSIS — I7101 Dissection of thoracic aorta: Secondary | ICD-10-CM

## 2020-12-13 DIAGNOSIS — I71019 Dissection of thoracic aorta, unspecified: Secondary | ICD-10-CM

## 2020-12-13 DIAGNOSIS — Z01818 Encounter for other preprocedural examination: Secondary | ICD-10-CM

## 2020-12-13 DIAGNOSIS — I340 Nonrheumatic mitral (valve) insufficiency: Secondary | ICD-10-CM

## 2020-12-13 DIAGNOSIS — N400 Enlarged prostate without lower urinary tract symptoms: Secondary | ICD-10-CM | POA: Diagnosis not present

## 2020-12-13 MED ORDER — IOPAMIDOL (ISOVUE-370) INJECTION 76%
75.0000 mL | Freq: Once | INTRAVENOUS | Status: AC | PRN
  Administered 2020-12-13: 75 mL via INTRAVENOUS

## 2020-12-14 ENCOUNTER — Ambulatory Visit: Payer: PPO | Admitting: Podiatry

## 2020-12-19 ENCOUNTER — Encounter: Payer: PPO | Admitting: Thoracic Surgery (Cardiothoracic Vascular Surgery)

## 2020-12-21 ENCOUNTER — Other Ambulatory Visit (HOSPITAL_COMMUNITY)
Admission: RE | Admit: 2020-12-21 | Discharge: 2020-12-21 | Disposition: A | Discharge status: Home / Self Care | Payer: PPO | Source: Ambulatory Visit | Attending: Cardiovascular Disease | Admitting: Cardiovascular Disease

## 2020-12-21 DIAGNOSIS — Z01812 Encounter for preprocedural laboratory examination: Secondary | ICD-10-CM | POA: Diagnosis not present

## 2020-12-21 DIAGNOSIS — Z20822 Contact with and (suspected) exposure to covid-19: Secondary | ICD-10-CM | POA: Insufficient documentation

## 2020-12-21 LAB — SARS CORONAVIRUS 2 (TAT 6-24 HRS): SARS Coronavirus 2: NEGATIVE

## 2020-12-22 ENCOUNTER — Telehealth: Payer: Self-pay | Admitting: *Deleted

## 2020-12-22 NOTE — Telephone Encounter (Signed)
Pt contacted pre-catheterization scheduled at [facility] for: December 23, 2020 9 AM Verified arrival time and place: [place] [facility]) at: 7 AM   No solid food after midnight prior to cath, clear liquids until 5 AM day of procedure.   AM meds can be  taken pre-cath with sips of water including: ASA 81 mg   Confirmed patient has responsible adult to drive home post procedure and be with patient first 24 hours after arriving home: yes  You are allowed ONE visitor in the waiting room during the time you are at the hospital for your procedure. Both you and your visitor must wear a mask once you enter the hospital.   Reviewed procedure/mask/visitor instructions with patient.

## 2020-12-23 ENCOUNTER — Other Ambulatory Visit: Payer: Self-pay

## 2020-12-23 ENCOUNTER — Encounter (HOSPITAL_COMMUNITY)
Admission: RE | Disposition: A | Discharge status: Home / Self Care | Payer: Self-pay | Source: Home / Self Care | Attending: Cardiovascular Disease

## 2020-12-23 ENCOUNTER — Ambulatory Visit (HOSPITAL_COMMUNITY)
Admission: RE | Admit: 2020-12-23 | Discharge: 2020-12-23 | Disposition: A | Discharge status: Home / Self Care | Payer: PPO | Attending: Cardiovascular Disease | Admitting: Cardiovascular Disease

## 2020-12-23 DIAGNOSIS — I340 Nonrheumatic mitral (valve) insufficiency: Secondary | ICD-10-CM | POA: Diagnosis not present

## 2020-12-23 DIAGNOSIS — I080 Rheumatic disorders of both mitral and aortic valves: Secondary | ICD-10-CM | POA: Diagnosis not present

## 2020-12-23 DIAGNOSIS — I2510 Atherosclerotic heart disease of native coronary artery without angina pectoris: Secondary | ICD-10-CM | POA: Diagnosis not present

## 2020-12-23 HISTORY — PX: RIGHT/LEFT HEART CATH AND CORONARY ANGIOGRAPHY: CATH118266

## 2020-12-23 LAB — POCT I-STAT 7, (LYTES, BLD GAS, ICA,H+H)
Acid-Base Excess: 1 mmol/L (ref 0.0–2.0)
Bicarbonate: 26.9 mmol/L (ref 20.0–28.0)
Calcium, Ion: 1.26 mmol/L (ref 1.15–1.40)
HCT: 38 % — ABNORMAL LOW (ref 39.0–52.0)
Hemoglobin: 12.9 g/dL — ABNORMAL LOW (ref 13.0–17.0)
O2 Saturation: 98 %
Potassium: 3.9 mmol/L (ref 3.5–5.1)
Sodium: 139 mmol/L (ref 135–145)
TCO2: 28 mmol/L (ref 22–32)
pCO2 arterial: 46 mmHg (ref 32.0–48.0)
pH, Arterial: 7.374 (ref 7.350–7.450)
pO2, Arterial: 117 mmHg — ABNORMAL HIGH (ref 83.0–108.0)

## 2020-12-23 LAB — POCT I-STAT EG7
Acid-Base Excess: 1 mmol/L (ref 0.0–2.0)
Bicarbonate: 25.9 mmol/L (ref 20.0–28.0)
Calcium, Ion: 1.16 mmol/L (ref 1.15–1.40)
HCT: 36 % — ABNORMAL LOW (ref 39.0–52.0)
Hemoglobin: 12.2 g/dL — ABNORMAL LOW (ref 13.0–17.0)
O2 Saturation: 78 %
Potassium: 3.7 mmol/L (ref 3.5–5.1)
Sodium: 141 mmol/L (ref 135–145)
TCO2: 27 mmol/L (ref 22–32)
pCO2, Ven: 43.2 mmHg — ABNORMAL LOW (ref 44.0–60.0)
pH, Ven: 7.385 (ref 7.250–7.430)
pO2, Ven: 43 mmHg (ref 32.0–45.0)

## 2020-12-23 SURGERY — RIGHT/LEFT HEART CATH AND CORONARY ANGIOGRAPHY
Anesthesia: LOCAL

## 2020-12-23 MED ORDER — HEPARIN (PORCINE) IN NACL 1000-0.9 UT/500ML-% IV SOLN
INTRAVENOUS | Status: AC
  Filled 2020-12-23: qty 500

## 2020-12-23 MED ORDER — SODIUM CHLORIDE 0.9 % IV SOLN
250.0000 mL | INTRAVENOUS | Status: DC | PRN
Start: 2020-12-23 — End: 2020-12-23

## 2020-12-23 MED ORDER — MIDAZOLAM HCL 2 MG/2ML IJ SOLN
INTRAMUSCULAR | Status: DC | PRN
  Administered 2020-12-23 (×2): 1 mg via INTRAVENOUS

## 2020-12-23 MED ORDER — SODIUM CHLORIDE 0.9% FLUSH: 3.0000 mL | INTRAVENOUS | Status: DC | PRN

## 2020-12-23 MED ORDER — ASPIRIN 81 MG PO CHEW: 81.0000 mg | CHEWABLE_TABLET | ORAL | Status: DC

## 2020-12-23 MED ORDER — IOHEXOL 350 MG/ML SOLN
INTRAVENOUS | Status: DC | PRN
  Administered 2020-12-23: 50 mL

## 2020-12-23 MED ORDER — LIDOCAINE HCL (PF) 1 % IJ SOLN
INTRAMUSCULAR | Status: DC | PRN
  Administered 2020-12-23: 5 mL
  Administered 2020-12-23: 2 mL

## 2020-12-23 MED ORDER — HEPARIN SODIUM (PORCINE) 1000 UNIT/ML IJ SOLN
INTRAMUSCULAR | Status: AC
  Filled 2020-12-23: qty 1

## 2020-12-23 MED ORDER — VERAPAMIL HCL 2.5 MG/ML IV SOLN
INTRAVENOUS | Status: DC | PRN
  Administered 2020-12-23: 10 mL via INTRA_ARTERIAL

## 2020-12-23 MED ORDER — LIDOCAINE HCL (PF) 1 % IJ SOLN
INTRAMUSCULAR | Status: AC
  Filled 2020-12-23: qty 30

## 2020-12-23 MED ORDER — FENTANYL CITRATE (PF) 100 MCG/2ML IJ SOLN
INTRAMUSCULAR | Status: DC | PRN
  Administered 2020-12-23 (×2): 25 ug via INTRAVENOUS

## 2020-12-23 MED ORDER — SODIUM CHLORIDE 0.9 % WEIGHT BASED INFUSION
3.0000 mL/kg/h | INTRAVENOUS | Status: AC
  Administered 2020-12-23: 3 mL/kg/h via INTRAVENOUS

## 2020-12-23 MED ORDER — SODIUM CHLORIDE 0.9 % WEIGHT BASED INFUSION: 1.0000 mL/kg/h | INTRAVENOUS | Status: DC

## 2020-12-23 MED ORDER — FENTANYL CITRATE (PF) 100 MCG/2ML IJ SOLN
INTRAMUSCULAR | Status: AC
  Filled 2020-12-23: qty 2

## 2020-12-23 MED ORDER — HEPARIN (PORCINE) IN NACL 1000-0.9 UT/500ML-% IV SOLN
INTRAVENOUS | Status: DC | PRN
  Administered 2020-12-23 (×2): 500 mL

## 2020-12-23 MED ORDER — HEPARIN SODIUM (PORCINE) 1000 UNIT/ML IJ SOLN
INTRAMUSCULAR | Status: DC | PRN
  Administered 2020-12-23: 4000 [IU] via INTRAVENOUS

## 2020-12-23 MED ORDER — MIDAZOLAM HCL 2 MG/2ML IJ SOLN
INTRAMUSCULAR | Status: AC
  Filled 2020-12-23: qty 2

## 2020-12-23 MED ORDER — VERAPAMIL HCL 2.5 MG/ML IV SOLN
INTRAVENOUS | Status: AC
  Filled 2020-12-23: qty 2

## 2020-12-23 SURGICAL SUPPLY — 11 items

## 2020-12-23 NOTE — Discharge Instructions (Signed)
Radial Site Care  This sheet gives you information about how to care for yourself after your procedure. Your health care provider may also give you more specific instructions. If you have problems or questions, contact your health care provider. What can I expect after the procedure? After the procedure, it is common to have:  Bruising and tenderness at the catheter insertion area. Follow these instructions at home: Medicines  Take over-the-counter and prescription medicines only as told by your health care provider. Insertion site care  Follow instructions from your health care provider about how to take care of your insertion site. Make sure you: ? Wash your hands with soap and water before you change your bandage (dressing). If soap and water are not available, use hand sanitizer. ? Change your dressing as told by your health care provider. ? Leave stitches (sutures), skin glue, or adhesive strips in place. These skin closures may need to stay in place for 2 weeks or longer. If adhesive strip edges start to loosen and curl up, you may trim the loose edges. Do not remove adhesive strips completely unless your health care provider tells you to do that.  Check your insertion site every day for signs of infection. Check for: ? Redness, swelling, or pain. ? Fluid or blood. ? Pus or a bad smell. ? Warmth.  Do not take baths, swim, or use a hot tub until your health care provider approves.  You may shower 24-48 hours after the procedure, or as directed by your health care provider. ? Remove the dressing and gently wash the site with plain soap and water. ? Pat the area dry with a clean towel. ? Do not rub the site. That could cause bleeding.  Do not apply powder or lotion to the site. Activity  For 24 hours after the procedure, or as directed by your health care provider: ? Do not flex or bend the affected arm. ? Do not push or pull heavy objects with the affected arm. ? Do not drive  yourself home from the hospital or clinic. You may drive 24 hours after the procedure unless your health care provider tells you not to. ? Do not operate machinery or power tools.  Do not lift anything that is heavier than 10 lb (4.5 kg), or the limit that you are told, until your health care provider says that it is safe.  Ask your health care provider when it is okay to: ? Return to work or school. ? Resume usual physical activities or sports. ? Resume sexual activity.   General instructions  If the catheter site starts to bleed, raise your arm and put firm pressure on the site. If the bleeding does not stop, get help right away. This is a medical emergency.  If you went home on the same day as your procedure, a responsible adult should be with you for the first 24 hours after you arrive home.  Keep all follow-up visits as told by your health care provider. This is important. Contact a health care provider if:  You have a fever.  You have redness, swelling, or yellow drainage around your insertion site. Get help right away if:  You have unusual pain at the radial site.  The catheter insertion area swells very fast.  The insertion area is bleeding, and the bleeding does not stop when you hold steady pressure on the area.  Your arm or hand becomes pale, cool, tingly, or numb. These symptoms may represent a serious   problem that is an emergency. Do not wait to see if the symptoms will go away. Get medical help right away. Call your local emergency services (911 in the [place] Do not drive yourself to the hospital. Summary  After the procedure, it is common to have bruising and tenderness at the site.  Follow instructions from your health care provider about how to take care of your radial site wound. Check the wound every day for signs of infection.  Do not lift anything that is heavier than 10 lb (4.5 kg), or the limit that you are told, until your health care provider says that it  is safe. This information is not intended to replace advice given to you by your health care provider. Make sure you discuss any questions you have with your health care provider. Document Revised: 11/27/2017 Document Reviewed: 11/27/2017 Elsevier Patient Education  2021 [employer]vier Inc.  

## 2020-12-23 NOTE — Interval H&P Note (Signed)
History and Physical Interval Note:  12/23/2020 9:08 AM  Thomas [name]  has presented today for surgery, with the diagnosis of MVP,MR.  The various methods of treatment have been discussed with the patient and family. After consideration of risks, benefits and other options for treatment, the patient has consented to  Procedure(s): RIGHT/LEFT HEART CATH AND CORONARY ANGIOGRAPHY (N/A) as a surgical intervention.  The patient's history has been reviewed, patient examined, no change in status, stable for surgery.  I have reviewed the patient's chart and labs.  Questions were answered to the patient's satisfaction.     Sherren Mocha

## 2020-12-26 ENCOUNTER — Encounter: Payer: PPO | Admitting: Thoracic Surgery (Cardiothoracic Vascular Surgery)

## 2020-12-26 ENCOUNTER — Encounter (HOSPITAL_COMMUNITY): Payer: Self-pay | Admitting: Cardiovascular Disease

## 2021-01-09 ENCOUNTER — Encounter: Payer: Self-pay | Admitting: Thoracic Surgery (Cardiothoracic Vascular Surgery)

## 2021-01-09 ENCOUNTER — Other Ambulatory Visit: Payer: Self-pay | Admitting: *Deleted

## 2021-01-09 ENCOUNTER — Other Ambulatory Visit: Payer: Self-pay

## 2021-01-09 ENCOUNTER — Ambulatory Visit: Payer: PPO | Admitting: Thoracic Surgery (Cardiothoracic Vascular Surgery)

## 2021-01-09 ENCOUNTER — Encounter: Payer: Self-pay | Admitting: *Deleted

## 2021-01-09 VITALS — BP 125/77 | HR 75 | Resp 20 | Ht 70.0 in

## 2021-01-09 DIAGNOSIS — I340 Nonrheumatic mitral (valve) insufficiency: Secondary | ICD-10-CM

## 2021-01-09 DIAGNOSIS — I059 Rheumatic mitral valve disease, unspecified: Secondary | ICD-10-CM | POA: Diagnosis not present

## 2021-01-09 DIAGNOSIS — I341 Nonrheumatic mitral (valve) prolapse: Secondary | ICD-10-CM

## 2021-01-09 NOTE — Progress Notes (Signed)
[address]       [place] [address]             [phone]     CARDIOTHORACIC SURGERY OFFICE NOTE  Referring Provider is [name], MD PCP is [name], MD   HPI:  Patient is a 75 year old male with mitral valve prolapse who returns the office today for follow-up of severe asymptomatic primary mitral regurgitation.  He was originally seen in consultation on November 28, 2020.  Since then he underwent diagnostic cardiac catheterization by Dr. Burt Knack which revealed nonobstructive coronary artery disease and very mildly elevated right heart pressures.  CT angiography was performed at the patient returns to our office to discuss elective surgical intervention.  He reports no new problems or complaints over the last few weeks.   Current Outpatient Medications  Medication Sig Dispense Refill  . Calcium Carb-Cholecalciferol (CALCIUM 600/VITAMIN D3) 600-800 MG-UNIT TABS Take 2 tablets by mouth daily.    . Magnesium Oxide 250 MG TABS Take 250 mg by mouth daily.    . Multiple Vitamins-Minerals (MULTIVITAMIN WITH MINERALS) tablet Take 1 tablet by mouth daily.     No current facility-administered medications for this visit.      Physical Exam:   BP 125/77 (BP Location: Left Arm, Patient Position: Sitting)   Pulse 75   Resp 20   Ht 5\' 10"  (1.778 m)   SpO2 95% Comment: RA  BMI 24.82 kg/m   General:  Well-appearing  Chest:   Clear to auscultation  CV:   Regular rate and rhythm with holosystolic murmur  Incisions:  n/a  Abdomen:  Soft nontender  Extremities:  Warm and well-perfused  Diagnostic Tests:  RIGHT/LEFT HEART CATH AND CORONARY ANGIOGRAPHY    Conclusion  1.  Patent coronary arteries with mild nonobstructive narrowing at the ostium of the left main and ostium of the left circumflex, no significant stenosis present. 2.  Known severe mitral regurgitation by noninvasive assessment with normal pulmonary capillary wedge pressure and pulmonary  artery pressures, preserved cardiac output  Recommend: Continued surgical evaluation for consideration of mitral valve repair  Indications  Nonrheumatic mitral valve regurgitation [I34.0 (ICD-10-CM)]   Procedural Details  Technical Details INDICATION: Severe mitral regurgitation  PROCEDURAL DETAILS: There was an indwelling IV in a right antecubital vein. Using normal sterile technique, the IV was changed out for a 5 Fr brachial sheath over a 0.018 inch wire. The right wrist was then prepped, draped, and anesthetized with 1% lidocaine. Using the modified Seldinger technique a 5/6 French Slender sheath was placed in the right radial artery. Intra-arterial verapamil was administered through the radial artery sheath. IV heparin was administered after a JR4 catheter was advanced into the central aorta. A Swan-Ganz catheter was used for the right heart catheterization. Standard protocol was followed for recording of right heart pressures and sampling of oxygen saturations. Fick cardiac output was calculated. Standard Judkins catheters were used for selective coronary angiography. LV pressure is recorded and an aortic valve pullback is performed. There were no immediate procedural complications. The patient was transferred to the post catheterization recovery area for further monitoring.    Estimated blood loss <50 mL.   During this procedure medications were administered to achieve and maintain moderate conscious sedation while the patient's heart rate, blood pressure, and oxygen saturation were continuously monitored and I was present face-to-face 100% of this time.   Medications (Filter: Administrations occurring from 0848 to 0939 on 12/23/20)  Heparin (Porcine)  in NaCl 1000-0.9 UT/500ML-% SOLN (mL) Total volume:  1,000 mL  Date/Time Rate/Dose/Volume Action   12/23/20 0854 500 mL Given   0854 500 mL Given    midazolam (VERSED) injection (mg) Total dose:  2 mg  Date/Time Rate/Dose/Volume  Action   12/23/20 0906 1 mg Given   0917 1 mg Given    fentaNYL (SUBLIMAZE) injection (mcg) Total dose:  50 mcg  Date/Time Rate/Dose/Volume Action   12/23/20 0906 25 mcg Given   0917 25 mcg Given    lidocaine (PF) (XYLOCAINE) 1 % injection (mL) Total volume:  7 mL  Date/Time Rate/Dose/Volume Action   12/23/20 0914 2 mL Given   0915 5 mL Given    Radial Cocktail/Verapamil only (mL) Total volume:  10 mL  Date/Time Rate/Dose/Volume Action   12/23/20 0916 10 mL Given    heparin sodium (porcine) injection (Units) Total dose:  4,000 Units  Date/Time Rate/Dose/Volume Action   12/23/20 0922 4,000 Units Given    iohexol (OMNIPAQUE) 350 MG/ML injection (mL) Total volume:  50 mL  Date/Time Rate/Dose/Volume Action   12/23/20 0936 50 mL Given     Sedation Time  Sedation Time Physician-1: 25 minutes   Contrast  Medication Name Total Dose  iohexol (OMNIPAQUE) 350 MG/ML injection 50 mL    Radiation/Fluoro  Fluoro time: 3.4 (min) DAP: 12466 (mGycm2) Cumulative Air Kerma: 244 (mGy)   Coronary Findings   Diagnostic Dominance: Right  Left Main  Ost LM lesion is 30% stenosed. There is mild ostial narrowing of the left mainstem, estimated stenosis no greater than 30%.  Left Anterior Descending  The LAD is patent throughout with minor luminal irregularities. The first septal perforator gives off a septal cascade.  Left Circumflex  Ost Cx to Prox Cx lesion is 30% stenosed. The circumflex is widely patent with mild ostial narrowing of approximately 25 to 30%. There is no significant stenosis present. There are 2 obtuse marginal branches supplied by the circumflex.  Right Coronary Artery  Vessel is large. The vessel exhibits minimal luminal irregularities. The right coronary artery is a large, dominant vessel. Distally the vessel divides into the PDA and posterior AV segment which supplies a single posterolateral branch. There are mild irregularities with no significant  stenoses throughout the RCA distribution.   Intervention   No interventions have been documented.  Right Heart  Right Heart Pressures LV EDP is normal.   Coronary Diagrams   Diagnostic Dominance: Right    Intervention    Implants    No implant documentation for this case.    Syngo Images  Show images for CARDIAC CATHETERIZATION  Images on Long Term Storage  Show images for Leander, Tout III  Link to Procedure Log  Procedure Log     Hemo Data  Flowsheet Row Most Recent Value  Fick Cardiac Output 6.37 L/min  Fick Cardiac Output Index 3.24 (L/min)/BSA  RA A Wave 6 mmHg  RA V Wave 5 mmHg  RA Mean 2 mmHg  RV Systolic Pressure 33 mmHg  RV Diastolic Pressure 0 mmHg  RV EDP 6 mmHg  PA Systolic Pressure 31 mmHg  PA Diastolic Pressure 11 mmHg  PA Mean 22 mmHg  PW A Wave 15 mmHg  PW V Wave 19 mmHg  PW Mean 13 mmHg  AO Systolic Pressure 034 mmHg  AO Diastolic Pressure 59 mmHg  AO Mean 84 mmHg  LV Systolic Pressure 742 mmHg  LV Diastolic Pressure 6 mmHg  LV EDP 13 mmHg  AOp Systolic Pressure 595  mmHg  AOp Diastolic Pressure 62 mmHg  AOp Mean Pressure 85 mmHg  LVp Systolic Pressure 631 mmHg  LVp Diastolic Pressure 5 mmHg  LVp EDP Pressure 11 mmHg  QP/QS 1  TPVR Index 6.8 HRUI  TSVR Index 25.95 HRUI  PVR SVR Ratio 0.11  TPVR/TSVR Ratio 0.26    CT ANGIOGRAPHY CHEST, ABDOMEN AND PELVIS  TECHNIQUE: Multidetector CT imaging through the chest, abdomen and pelvis was performed using the standard protocol during bolus administration of intravenous contrast. Multiplanar reconstructed images and MIPs were obtained and reviewed to evaluate the vascular anatomy.  CONTRAST:  75mL ISOVUE-370 IOPAMIDOL (ISOVUE-370) INJECTION 76%  COMPARISON:  None.  FINDINGS: CTA CHEST FINDINGS  Cardiovascular: The thoracic aorta is of normal caliber. No intramural hematoma, aneurysm, or dissection. No significant atherosclerotic calcification. Arch vasculature  demonstrates normal anatomic configuration and is widely patent proximally.  No significant coronary artery calcification. Global cardiac size within normal limits. No pericardial effusion. Central pulmonary arteries are of normal caliber.  Mediastinum/Nodes: No enlarged mediastinal, hilar, or axillary lymph nodes. Thyroid gland, trachea, and esophagus demonstrate no significant findings.  Lungs/Pleura: Mild bibasilar atelectasis. The lungs are otherwise clear. No focal pulmonary nodule or infiltrate. No pneumothorax or pleural effusion. The central airways are widely patent.  Musculoskeletal: No acute bone abnormality. No suspicious lytic or blastic bone lesion. Probable bone island within the posterior aspect of the T7 vertebral body.  Review of the MIP images confirms the above findings.  CTA ABDOMEN AND PELVIS FINDINGS  VASCULAR  Aorta: Normal caliber aorta without aneurysm, dissection, vasculitis or significant stenosis.  Celiac: Less than 50% stenosis at its origin secondary to extrinsic compression. Otherwise widely patent. Normal anatomic configuration. No significant atherosclerotic disease.  SMA: Widely patent.  Renals: Dual right and single left renal arteries demonstrate wide patency, normal vascular morphology, and no aneurysm.  IMA: Widely patent  Inflow: Minimal atherosclerotic calcification within the left common iliac artery. Widely patent without hemodynamically significant stenosis, aneurysm, or dissection.  Veins: Not well opacified. Duplicated inferior vena cava noted, a normal anatomic variant.  Review of the MIP images confirms the above findings.  NON-VASCULAR  Hepatobiliary: No focal liver abnormality is seen. No gallstones, gallbladder wall thickening, or biliary dilatation.  Pancreas: Unremarkable  Spleen: Unremarkable  Adrenals/Urinary Tract: The adrenal glands are unremarkable. The kidneys are normal. The  bladder is largely decompressed. There is, however, mild circumferential bladder wall thickening suggesting changes of at least mild chronic bladder outlet obstruction.  Stomach/Bowel: Stomach is within normal limits. Appendix appears normal. No evidence of bowel wall thickening, distention, or inflammatory changes. No free intraperitoneal gas or fluid.  Lymphatic: No pathologic adenopathy within the abdomen and pelvis.  Reproductive: Marked prostatic enlargement with an estimated volume of 93 cc. Seminal vesicles are unremarkable.  Other: Bilateral inguinal hernia repair with mesh has been performed. Rectum unremarkable.  Musculoskeletal: No acute bone abnormality within the abdomen and pelvis. No lytic or blastic bone lesion identified.  Review of the MIP images confirms the above findings.  IMPRESSION: No evidence of thoracoabdominal aortic aneurysm or dissection. Minimal atherosclerotic disease.  Duplicated inferior vena cava, a normal anatomic variant.  Marked prostatic enlargement. Mild bladder wall thickening suggest changes of at least mild chronic bladder outlet obstruction. The bladder, however, is decompressed.   Electronically Signed   By: Fidela Salisbury MD   On: 12/13/2020 08:57   Impression:  Patient has mitral valve prolapse with stage C severe asymptomatic primary mitral regurgitation.  Although he does not  exercise on a regular basis the patient remains reasonably active physically and he specifically denies any symptoms of exertional shortness of breath, chest discomfort, or fatigue.  I have personally reviewed the patient's recent transthoracic and transesophageal echocardiograms diagnostic cardiac catheterization, and CT angiogram.  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  There is severe prolapse involving the P2 and P3 segments of the  posterior leaflet.  There may be at least 1 ruptured primary chordae tendinae.  There does not appear to be significant calcification.  Left ventricular size and systolic function remains normal.  There is moderate left atrial enlargement.  No other significant abnormalities are noted.  Catheterization revealed mild nonobstructive coronary artery disease and preserved cardiac output with very mildly elevated right heart pressures.  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  Options include continued close observation on medical therapy versus elective mitral valve repair.  Based upon review of the patient's recent echocardiograms I feel there is better than 95% likelihood of successful valve repair.  Operative risks should be quite low with the patient's only significant risk factor being his age.  The patient may be a good candidate for minimally invasive approach for surgery although there is reported history of left-sided vena cava noted in his past medical history.  The patient does not have significant aortic insufficiency.   Plan:  The patient was again counseled at length regarding  diagnosis of severe primary mitral regurgitation.  We reviewed the results of their diagnostic tests including images from the most recent echocardiogram.  We discussed the natural history of mitral regurgitation as well as alternative treatment strategies.  We discussed the impact of his age, current state of health, and apparent lack of significant comorbid medical problems on clinical decision making.  We went on to discuss the indications, risks and potential benefits of mitral valve repair as well as the timing of surgical intervention.  The rationale for elective surgery has been explained, including a comparison between surgery and continued medical therapy with close follow-up.  The likelihood of successful and  durable mitral valve repair has been discussed with particular reference to the findings of the most recent echocardiogram.  Based upon these findings and previous experience, I have quoted a greater than 95 percent likelihood of successful valve repair with less than 1 percent risk of mortality or major morbidity.  Alternative surgical approaches have been discussed including a comparison between conventional sternotomy and minimally-invasive techniques.  The relative risks and benefits of each have been reviewed as they pertain to the patient's specific circumstances, and expectations for the patient's postoperative convalescence has been discussed, including the fact that because he lives alone he will need to have family member or close friend present to stay with him 24 hours a day when he first leaves the hospital..   The patient desires to proceed with elective mitral valve repair in the near future.  We tentatively plan for surgery on January 24, 2021.  The patient understands and accepts all potential risks of surgery including but not limited to risk of death, stroke or other neurologic complication, myocardial infarction, congestive heart failure, respiratory failure, renal failure, bleeding requiring transfusion and/or reexploration, arrhythmia, heart block or bradycardia requiring permanent pacemaker insertion, infection or other wound complications, pneumonia, pleural and/or pericardial effusion, pulmonary embolus, aortic dissection or other major vascular complication, or other immediate  or delayed complications related to valve repair or replacement including but not limited to recurrent or persistent mitral regurgitation and/or mitral stenosis, LV outflow tract obstruction, aortic insufficiency, paravalvular leak, posterior AV groove disruption, structural valve deterioration and failure, thrombosis, embolization, or endocarditis.  Specific risks potentially related to the minimally-invasive  approach were discussed at length, including but not limited to risk of conversion to full or partial sternotomy, aortic dissection or other major vascular complication, unilateral acute lung injury or pulmonary edema, phrenic nerve dysfunction or paralysis, rib fracture, chronic pain, lung hernia, or lymphocele. All of his questions have been answered.    I spent in excess of 15 minutes during the conduct of this office consultation and >50% of this time involved direct face-to-face encounter with the patient for counseling and/or coordination of their care.    Valentina Gu. Roxy Manns, MD 01/09/2021 10:05 AM

## 2021-01-09 NOTE — Patient Instructions (Signed)
   Continue taking all current medications without change through the day before surgery.  Make sure to bring all of your medications with you when you come for your Pre-Admission Testing appointment at [facility] Short-Stay Department.  Have nothing to eat or drink after midnight the night before surgery.  On the morning of surgery do not take any medications.  At your appointment for Pre-Admission Testing at the [facility] Short-Stay Department you will be asked to sign permission forms for your upcoming surgery.  By definition your signature on these forms implies that you and/or your designee provide full informed consent for your planned surgical procedure(s), that alternative treatment options have been discussed, that you understand and accept any and all potential risks, and that you have some understanding of what to expect for your post-operative convalescence.  For elective mitral valve repair or replacement potential operative risks include but are not limited to at least some risk of death, stroke or other neurologic complication, myocardial infarction, congestive heart failure, respiratory failure, renal failure, bleeding requiring transfusion and/or reexploration, arrhythmia, heart block or bradycardia requiring permanent pacemaker insertion, infection or other wound complications, pneumonia, pleural and/or pericardial effusion, pulmonary embolus, aortic dissection or other major vascular complication, or other immediate or delayed complications related to valve repair or replacement including but not limited to recurrent or persistent mitral regurgitation and/or mitral stenosis, LV outflow tract obstruction, aortic insufficiency, paravalvular leak, posterior AV groove disruption, late structural valve deterioration and failure, thrombosis, embolization, or endocarditis.  Specific risks potentially related to the minimally-invasive approach include but  are not limited to risk of conversion to full or partial sternotomy, aortic dissection or other major vascular complication, unilateral acute lung injury or pulmonary edema, phrenic nerve dysfunction or paralysis, rib fracture, chronic pain, lung hernia, or lymphocele.  Please call to schedule a follow-up appointment in our office prior to surgery if you have any unresolved questions about your planned surgical procedure, the associated risks, alternative treatment options, and/or expectations for your post-operative recovery.    

## 2021-01-18 ENCOUNTER — Ambulatory Visit: Payer: PPO | Admitting: Podiatry

## 2021-01-19 NOTE — Pre-Procedure Instructions (Signed)
Surgical Instructions    Your procedure is scheduled on Tuesday, March 22nd.  Report to [facility] Main Entrance "A" at 5:30 A.M., then check in with the Admitting office.  Call this number if you have problems the morning of surgery:  [phone]   If you have any questions prior to your surgery date call [phone]: Open Monday-Friday 8am-4pm    Remember:  Do not eat or drink after midnight the night before your surgery    There are no medications that you need to take the morning of surgery.   As of today, STOP taking any Aspirin (unless otherwise instructed by your surgeon) Aleve, Naproxen, Ibuprofen, Motrin, Advil, Goody's, BC's, all herbal medications, fish oil, and all vitamins.                     Do not wear jewelry.            Do not wear lotions, powders, colognes, or deodorant.            Men may shave face and neck.            Do not bring valuables to the hospital.            [facility] is not responsible for any belongings or valuables.  Do NOT Smoke (Tobacco/Vaping) or drink Alcohol 24 hours prior to your procedure If you use a CPAP at night, you may bring all equipment for your overnight stay.   Contacts, glasses, dentures or bridgework may not be worn into surgery, please bring cases for these belongings   For patients admitted to the hospital, discharge time will be determined by your treatment team.   Patients discharged the day of surgery will not be allowed to drive home, and someone needs to stay with them for 24 hours.    Special instructions:   [place]- Preparing For Surgery  Before surgery, you can play an important role. Because skin is not sterile, your skin needs to be as free of germs as possible. You can reduce the number of germs on your skin by washing with CHG (chlorahexidine gluconate) Soap before surgery.  CHG is an antiseptic cleaner which kills germs and bonds with the skin to continue killing germs even after washing.    Oral  Hygiene is also important to reduce your risk of infection.  Remember - BRUSH YOUR TEETH THE MORNING OF SURGERY WITH YOUR REGULAR TOOTHPASTE  Please do not use if you have an allergy to CHG or antibacterial soaps. If your skin becomes reddened/irritated stop using the CHG.  Do not shave (including legs and underarms) for at least 48 hours prior to first CHG shower. It is OK to shave your face.  Please follow these instructions carefully.   1. Shower the NIGHT BEFORE SURGERY and the MORNING OF SURGERY  2. If you chose to wash your hair, wash your hair first as usual with your normal shampoo.  3. After you shampoo, rinse your hair and body thoroughly to remove the shampoo.  4. Use CHG Soap as you would any other liquid soap. You can apply CHG directly to the skin and wash gently with a scrungie or a clean washcloth.   5. Apply the CHG Soap to your body ONLY FROM THE NECK DOWN.  Do not use on open wounds or open sores. Avoid contact with your eyes, ears, mouth and genitals (private parts). Wash Face and genitals (private parts)  with your normal soap.  6. Wash thoroughly, paying special attention to the area where your surgery will be performed.  7. Thoroughly rinse your body with warm water from the neck down.  8. DO NOT shower/wash with your normal soap after using and rinsing off the CHG Soap.  9. Pat yourself dry with a CLEAN TOWEL.  10. Wear CLEAN PAJAMAS to bed the night before surgery  11. Place CLEAN SHEETS on your bed the night before your surgery  12. DO NOT SLEEP WITH PETS.   Day of Surgery: Shower with CHG soap. Wear Clean/Comfortable clothing the morning of surgery Do not apply any deodorants/lotions.   Remember to brush your teeth WITH YOUR REGULAR TOOTHPASTE.   Please read over the following fact sheets that you were given.

## 2021-01-20 ENCOUNTER — Encounter (HOSPITAL_COMMUNITY): Payer: Self-pay

## 2021-01-20 ENCOUNTER — Ambulatory Visit (HOSPITAL_COMMUNITY)
Admission: RE | Admit: 2021-01-20 | Discharge: 2021-01-20 | Disposition: A | Discharge status: Home / Self Care | Payer: PPO | Source: Ambulatory Visit | Attending: Thoracic Surgery (Cardiothoracic Vascular Surgery) | Admitting: Thoracic Surgery (Cardiothoracic Vascular Surgery)

## 2021-01-20 ENCOUNTER — Other Ambulatory Visit (HOSPITAL_COMMUNITY)
Admission: RE | Admit: 2021-01-20 | Discharge: 2021-01-20 | Disposition: A | Discharge status: Home / Self Care | Payer: PPO | Source: Ambulatory Visit | Attending: Thoracic Surgery (Cardiothoracic Vascular Surgery) | Admitting: Thoracic Surgery (Cardiothoracic Vascular Surgery)

## 2021-01-20 ENCOUNTER — Other Ambulatory Visit: Payer: Self-pay

## 2021-01-20 ENCOUNTER — Encounter (HOSPITAL_COMMUNITY)
Admission: RE | Admit: 2021-01-20 | Discharge: 2021-01-20 | Disposition: A | Discharge status: Home / Self Care | Payer: PPO | Source: Ambulatory Visit | Attending: Thoracic Surgery (Cardiothoracic Vascular Surgery) | Admitting: Thoracic Surgery (Cardiothoracic Vascular Surgery)

## 2021-01-20 DIAGNOSIS — I340 Nonrheumatic mitral (valve) insufficiency: Secondary | ICD-10-CM | POA: Insufficient documentation

## 2021-01-20 DIAGNOSIS — Z01818 Encounter for other preprocedural examination: Secondary | ICD-10-CM

## 2021-01-20 DIAGNOSIS — Z20822 Contact with and (suspected) exposure to covid-19: Secondary | ICD-10-CM | POA: Insufficient documentation

## 2021-01-20 DIAGNOSIS — Z01812 Encounter for preprocedural laboratory examination: Secondary | ICD-10-CM | POA: Insufficient documentation

## 2021-01-20 HISTORY — DX: Pneumonia, unspecified organism: J18.9

## 2021-01-20 HISTORY — DX: Unspecified osteoarthritis, unspecified site: M19.90

## 2021-01-20 LAB — URINALYSIS, ROUTINE W REFLEX MICROSCOPIC
Bilirubin Urine: NEGATIVE
Glucose, UA: NEGATIVE mg/dL
Hgb urine dipstick: NEGATIVE
Ketones, ur: NEGATIVE mg/dL
Leukocytes,Ua: NEGATIVE
Nitrite: NEGATIVE
Protein, ur: NEGATIVE mg/dL
Specific Gravity, Urine: 1.025 (ref 1.005–1.030)
pH: 5.5 (ref 5.0–8.0)

## 2021-01-20 LAB — BLOOD GAS, ARTERIAL
Acid-Base Excess: 0.6 mmol/L (ref 0.0–2.0)
Bicarbonate: 24.5 mmol/L (ref 20.0–28.0)
Drawn by: 602861
FIO2: 21
O2 Saturation: 97.8 %
Patient temperature: 36.4
pCO2 arterial: 37.2 mmHg (ref 32.0–48.0)
pH, Arterial: 7.431 (ref 7.350–7.450)
pO2, Arterial: 96.9 mmHg (ref 83.0–108.0)

## 2021-01-20 LAB — PROTIME-INR
INR: 1.1 (ref 0.8–1.2)
Prothrombin Time: 13.6 seconds (ref 11.4–15.2)

## 2021-01-20 LAB — CBC
HCT: 41.7 % (ref 39.0–52.0)
Hemoglobin: 13.9 g/dL (ref 13.0–17.0)
MCH: 33.1 pg (ref 26.0–34.0)
MCHC: 33.3 g/dL (ref 30.0–36.0)
MCV: 99.3 fL (ref 80.0–100.0)
Platelets: 216 10*3/uL (ref 150–400)
RBC: 4.2 MIL/uL — ABNORMAL LOW (ref 4.22–5.81)
RDW: 12.8 % (ref 11.5–15.5)
WBC: 7.4 10*3/uL (ref 4.0–10.5)
nRBC: 0 % (ref 0.0–0.2)

## 2021-01-20 LAB — COMPREHENSIVE METABOLIC PANEL
ALT: 23 U/L (ref 0–44)
AST: 24 U/L (ref 15–41)
Albumin: 3.8 g/dL (ref 3.5–5.0)
Alkaline Phosphatase: 43 U/L (ref 38–126)
Anion gap: 7 (ref 5–15)
BUN: 20 mg/dL (ref 8–23)
CO2: 22 mmol/L (ref 22–32)
Calcium: 9.3 mg/dL (ref 8.9–10.3)
Chloride: 105 mmol/L (ref 98–111)
Creatinine, Ser: 0.74 mg/dL (ref 0.61–1.24)
GFR, Estimated: >60 mL/min (ref >60)
Glucose, Bld: 97 mg/dL (ref 70–99)
Potassium: 4.2 mmol/L (ref 3.5–5.1)
Sodium: 134 mmol/L — ABNORMAL LOW (ref 135–145)
Total Bilirubin: 0.9 mg/dL (ref 0.3–1.2)
Total Protein: 6.3 g/dL — ABNORMAL LOW (ref 6.5–8.1)

## 2021-01-20 LAB — SARS CORONAVIRUS 2 (TAT 6-24 HRS): SARS Coronavirus 2: NEGATIVE

## 2021-01-20 LAB — HEMOGLOBIN A1C
Hgb A1c MFr Bld: 5.7 % — ABNORMAL HIGH (ref 4.8–5.6)
Mean Plasma Glucose: 116.89 mg/dL

## 2021-01-20 LAB — APTT: aPTT: 29 seconds (ref 24–36)

## 2021-01-20 LAB — SURGICAL PCR SCREEN
MRSA, PCR: NEGATIVE
Staphylococcus aureus: NEGATIVE

## 2021-01-20 NOTE — Progress Notes (Signed)
PCP - Dr. [name] Cardiologist - Dr. Derl Barrow  Chest x-ray - 01/20/21 EKG - 01/20/21 Stress Test - 10+ years ago; normal study. No f/u needed ECHO - 11/23/20 Cardiac Cath - 12/23/20  Sleep Study - denies CPAP - denies  Blood Thinner Instructions: n/a Aspirin Instructions: n/a  COVID TEST- 01/20/21; pt aware of quarantine guidelines.   Anesthesia review: Yes; heart history  Patient denies shortness of breath, fever, cough and chest pain at PAT appointment   All instructions explained to the patient, with a verbal understanding of the material. Patient agrees to go over the instructions while at home for a better understanding. Patient also instructed to self quarantine after being tested for COVID-19. The opportunity to ask questions was provided.

## 2021-01-23 MED ORDER — TRANEXAMIC ACID (OHS) BOLUS VIA INFUSION
15.0000 mg/kg | INTRAVENOUS | Status: AC
  Administered 2021-01-24: 1192.5 mg via INTRAVENOUS
  Filled 2021-01-23: qty 1193

## 2021-01-23 MED ORDER — VANCOMYCIN HCL 1000 MG IV SOLR
INTRAVENOUS | Status: DC
  Filled 2021-01-23: qty 1000

## 2021-01-23 MED ORDER — MILRINONE LACTATE IN DEXTROSE 20-5 MG/100ML-% IV SOLN
0.3000 ug/kg/min | INTRAVENOUS | Status: DC
  Filled 2021-01-23: qty 100

## 2021-01-23 MED ORDER — SODIUM CHLORIDE 0.9 % IV SOLN
INTRAVENOUS | Status: DC
  Filled 2021-01-23: qty 30

## 2021-01-23 MED ORDER — SODIUM CHLORIDE 0.9 % IV SOLN
1.5000 g | INTRAVENOUS | Status: AC
  Administered 2021-01-24: 1.5 g via INTRAVENOUS
  Filled 2021-01-23: qty 1.5

## 2021-01-23 MED ORDER — EPINEPHRINE HCL 5 MG/250ML IV SOLN IN NS
0.0000 ug/min | INTRAVENOUS | Status: DC
  Filled 2021-01-23: qty 250

## 2021-01-23 MED ORDER — NITROGLYCERIN IN D5W 200-5 MCG/ML-% IV SOLN
2.0000 ug/min | INTRAVENOUS | Status: DC
  Filled 2021-01-23: qty 250

## 2021-01-23 MED ORDER — NOREPINEPHRINE 4 MG/250ML-% IV SOLN
0.0000 ug/min | INTRAVENOUS | Status: DC
  Filled 2021-01-23: qty 250

## 2021-01-23 MED ORDER — VANCOMYCIN HCL 1250 MG/250ML IV SOLN
1250.0000 mg | INTRAVENOUS | Status: AC
  Administered 2021-01-24: 1250 mg via INTRAVENOUS
  Filled 2021-01-23: qty 250

## 2021-01-23 MED ORDER — PLASMA-LYTE 148 IV SOLN
INTRAVENOUS | Status: DC
  Filled 2021-01-23: qty 2.5

## 2021-01-23 MED ORDER — SODIUM CHLORIDE 0.9 % IV SOLN
750.0000 mg | INTRAVENOUS | Status: AC
  Administered 2021-01-24: 750 mg via INTRAVENOUS
  Filled 2021-01-23: qty 750

## 2021-01-23 MED ORDER — POTASSIUM CHLORIDE 2 MEQ/ML IV SOLN
80.0000 meq | INTRAVENOUS | Status: DC
  Filled 2021-01-23: qty 40

## 2021-01-23 MED ORDER — TRANEXAMIC ACID (OHS) PUMP PRIME SOLUTION
2.0000 mg/kg | INTRAVENOUS | Status: DC
  Filled 2021-01-23: qty 1.59

## 2021-01-23 MED ORDER — INSULIN REGULAR(HUMAN) IN NACL 100-0.9 UT/100ML-% IV SOLN
INTRAVENOUS | Status: AC
  Administered 2021-01-24: 1 [IU]/h via INTRAVENOUS
  Filled 2021-01-23: qty 100

## 2021-01-23 MED ORDER — MANNITOL 20 % IV SOLN
Freq: Once | INTRAVENOUS | Status: DC
  Filled 2021-01-23: qty 13

## 2021-01-23 MED ORDER — DEXMEDETOMIDINE HCL IN NACL 400 MCG/100ML IV SOLN
0.1000 ug/kg/h | INTRAVENOUS | Status: AC
  Administered 2021-01-24: .4 ug/kg/h via INTRAVENOUS
  Filled 2021-01-23: qty 100

## 2021-01-23 MED ORDER — TRANEXAMIC ACID 1000 MG/10ML IV SOLN
1.5000 mg/kg/h | INTRAVENOUS | Status: AC
  Administered 2021-01-24: 1.5 mg/kg/h via INTRAVENOUS
  Filled 2021-01-23: qty 25

## 2021-01-23 MED ORDER — PHENYLEPHRINE HCL-NACL 20-0.9 MG/250ML-% IV SOLN
30.0000 ug/min | INTRAVENOUS | Status: AC
  Administered 2021-01-24: 20 ug/min via INTRAVENOUS
  Filled 2021-01-23: qty 250

## 2021-01-23 NOTE — Anesthesia Preprocedure Evaluation (Addendum)
Anesthesia Evaluation  Patient identified by MRN, date of birth, ID band Patient awake    Reviewed: Allergy & Precautions, NPO status , Patient's Chart, lab work & pertinent test results  Airway Mallampati: II  TM Distance: >3 FB Neck ROM: Full    Dental  (+) Teeth Intact   Pulmonary neg pulmonary ROS,    Pulmonary exam normal        Cardiovascular + Valvular Problems/Murmurs MR and MVP  Rhythm:Regular Rate:Normal + Systolic murmurs    Neuro/Psych negative neurological ROS  negative psych ROS   GI/Hepatic negative GI ROS, Neg liver ROS,   Endo/Other  negative endocrine ROS  Renal/GU negative Renal ROS   BPH, prostate Ca    Musculoskeletal  (+) Arthritis , Osteoarthritis,    Abdominal (+)  Abdomen: soft. Bowel sounds: normal.  Peds  Hematology   Anesthesia Other Findings   Reproductive/Obstetrics                            Anesthesia Physical Anesthesia Plan  ASA: III  Anesthesia Plan: General   Post-op Pain Management:    Induction: Intravenous  PONV Risk Score and Plan: 2 and Ondansetron, Dexamethasone, Midazolam and Treatment may vary due to age or medical condition  Airway Management Planned: Mask and Double Lumen EBT  Additional Equipment: Arterial line, CVP, PA Cath, TEE, 3D TEE and Ultrasound Guidance Line Placement  Intra-op Plan:   Post-operative Plan: Possible Post-op intubation/ventilation  Informed Consent: I have reviewed the patients History and Physical, chart, labs and discussed the procedure including the risks, benefits and alternatives for the proposed anesthesia with the patient or authorized representative who has indicated his/her understanding and acceptance.     Dental advisory given  Plan Discussed with: CRNA  Anesthesia Plan Comments: (ECHO 03/22: 1. 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  There is severe prolapse of the P1/P2 segment without  apparent flail segment. The P1/P2 segment 2D ERO is 0.81 cm2, R vol 130  cc. The P1/P2 segment 3D VCA is  ~0.80 cm2. A second jet is present due to incomplete coaptation of the  A3/P3 segments with 2D ERO 0.15 cm2, R vol 17 cc. The 3D VCA of this is  0.23 cm2. In total, by 2D ERO ~0.96 cm2, R vol 147 cc. R vol by VTI is 148  cc. RF ~67%. There is systolic flow  reversal in the R upper and left upper pulmonary veins. Overall, severe  mitral regurgitation is present. The mitral valve is myxomatous. Severe  mitral valve regurgitation. No evidence of mitral stenosis.  2. 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 There is mild  TR related to leaflet prolapse that was difficult to quantitate. The  tricuspid valve is myxomatous.  3. Eccentric pulmonary regurgitation is present possibly due to leaflet  prolapse vs commissural. Pulmonary regurgitation is mild.  4. Left ventricular ejection fraction, by estimation, is 60 to 65%. The  left ventricle has normal function.  5. Right ventricular systolic function is normal. The right ventricular  size is normal.  6. Left atrial size was mild to moderately dilated. No left atrial/left  atrial appendage thrombus was detected. The LAA emptying velocity was 70  cm/s.  7. The aortic valve is tricuspid. Aortic valve regurgitation is trivial.  No aortic stenosis is present.  8. There is mild (Grade II) layered plaque involving the descending  aorta.  Cath 02/22:  Patent coronary arteries with mild nonobstructive narrowing at the ostium of the left main and ostium of the left circumflex, no significant stenosis present. 2.  Known severe mitral regurgitation by noninvasive assessment with normal pulmonary capillary wedge pressure and pulmonary artery pressures, preserved cardiac output  Lab Results      Component                Value               Date                      WBC                       7.4                 01/20/2021                HGB                      13.9                01/20/2021                HCT                      41.7                01/20/2021                MCV                      99.3                01/20/2021                PLT                      216                 01/20/2021           Lab Results      Component                Value               Date                      NA                       134 (L)             01/20/2021                K                        4.2                 01/20/2021                CO2                      22                  01/20/2021  GLUCOSE                  97                  01/20/2021                BUN                      20                  01/20/2021                CREATININE               0.74                01/20/2021                CALCIUM                  9.3                 01/20/2021                GFRNONAA                 >60                 01/20/2021                GFRAA                    >60                 07/16/2017          )       Anesthesia Quick Evaluation

## 2021-01-23 NOTE — H&P (Signed)
[name]       [name]             [id number]          CARDIOTHORACIC SURGERY HISTORY AND PHYSICAL EXAM  Referring Provider is [name], MD PCP is [name], MD  Chief Complaint  Patient presents with  . Mitral Regurgitation    Initial surgical consult, TEE 1/10, ECHO 2/14    HPI:  Patient is a 75 year old male who has been referred for surgical consultation to discuss treatment options for management of mitral valve prolapse with severe mitral regurgitation.  Patient states that he was first noted to have a heart murmur in 1969 when he joined the [employer].  He was diagnosed with mitral valve prolapse.  Approximately 10 years ago his primary care physician noted that his heart murmur had become more prominent and an echocardiogram was performed confirming the presence of mitral valve prolapse with moderate mitral regurgitation.  The patient has been followed carefully ever since, most recently by Dr. [name].  Follow-up transthoracic echocardiogram performed October 18, 2020 revealed significant progression of disease with bileaflet prolapse and likely severe mitral regurgitation.  There remain normal left ventricular size and systolic function with moderate left atrial enlargement.  The patient subsequently underwent transesophageal echocardiogram November 23, 2020 which confirmed the presence of myxomatous degenerative disease with bileaflet prolapse and severe mitral regurgitation.  Cardiothoracic surgical consultation was requested.  Patient is single and lives alone locally in [place].  He has 1 adult son who lives nearby and is supportive.  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  He does not exercise on a regular basis but he remains active physically and walks his dogs on a daily basis, in excess of 1 mile each time.  He specifically denies any  significant symptoms of exertional shortness of breath or fatigue.  He has not noticed any changes in his exercise tolerance.  He has never had any significant chest pain or chest tightness either with activity or at rest.  He denies any history of PND, orthopnea, or lower extremity edema.  He has not had palpitations or significant dizzy spells.  He has no documented history of atrial fibrillation.  Patient was originally seen in consultation on November 28, 2020.  Since then he underwent diagnostic cardiac catheterization by Dr. Burt Knack which revealed nonobstructive coronary artery disease and very mildly elevated right heart pressures.  CT angiography was performed at the patient returns to our office to discuss elective surgical intervention.  He reports no new problems or complaints over the last few weeks.   Past Medical History:  Diagnosis Date  . Arthritis   . Bilateral inguinal hernia   . BPH (benign prostatic hyperplasia)   . Cancer [facility])    prostate cancer; per patient being followed by Dr [name] at [facility] urology ; currently no chemotherapy   . Mitral regurgitation   . Mitral valve prolapse    now seeing [name]   . Pneumonia    At age 18    Past Surgical History:  Procedure Laterality Date  . BUBBLE STUDY  11/23/2020   Procedure: BUBBLE STUDY;  Surgeon: [name], MD;  Location: [facility];  Service: Cardiovascular;;  . FEMORAL HERNIA REPAIR Right 07/22/2017   Procedure: HERNIA REPAIR FEMORAL;  Surgeon: Jackolyn Confer, MD;  Location: [facility];  Service:  General;  Laterality: Right;  . HERNIA REPAIR    . INGUINAL HERNIA REPAIR N/A 07/22/2017   Procedure: LAPAROSCOPIC BILATERAL INGUINAL HERNIA REPAIR WITH MESH;  Surgeon: Jackolyn Confer, MD;  Location: [facility];  Service: General;  Laterality: N/A;  . INSERTION OF MESH Bilateral 07/22/2017   Procedure: INSERTION OF MESH;  Surgeon: Jackolyn Confer, MD;  Location: [facility];  Service: General;  Laterality: Bilateral;   . RIGHT/LEFT HEART CATH AND CORONARY ANGIOGRAPHY N/A 12/23/2020   Procedure: RIGHT/LEFT HEART CATH AND CORONARY ANGIOGRAPHY;  Surgeon: Sherren Mocha, MD;  Location: [facility];  Service: Cardiovascular;  Laterality: N/A;  . TEE WITHOUT CARDIOVERSION N/A 11/23/2020   Procedure: TRANSESOPHAGEAL ECHOCARDIOGRAM (TEE);  Surgeon: [name], MD;  Location: [facility];  Service: Cardiovascular;  Laterality: N/A;  . TONSILLECTOMY AND ADENOIDECTOMY      No family history on file.  Social History Social History   Tobacco Use  . Smoking status: Never Smoker  . Smokeless tobacco: Never Used  Vaping Use  . Vaping Use: Never used  Substance Use Topics  . Alcohol use: Not Currently    Comment: seldom   . Drug use: No    Prior to Admission medications   Medication Sig Start Date End Date Taking? Authorizing Provider  Ascorbic Acid (VITAMIN C) 1000 MG tablet Take 1,000 mg by mouth daily.   Yes [provider]  Calcium Carb-Cholecalciferol (CALCIUM 600/VITAMIN D3) 600-800 MG-UNIT TABS Take 2 tablets by mouth daily.   Yes [provider]  Magnesium Oxide 250 MG TABS Take 250 mg by mouth daily.   Yes [provider]  Multiple Vitamins-Minerals (MULTIVITAMIN WITH MINERALS) tablet Take 1 tablet by mouth daily.   Yes [provider]    No Known Allergies     Review of Systems:              General:                      Normal appetite, normal energy, no weight gain, no weight loss, no fever             Cardiac:                       no chest pain with exertion, no chest pain at rest, no SOB with exertion, no resting SOB, no PND, no orthopnea, no palpitations, no arrhythmia, no atrial fibrillation, no LE edema, no dizzy spells, no syncope             Respiratory:                 no shortness of breath, no home oxygen, no productive cough, + dry cough, no bronchitis, no wheezing, no hemoptysis, no asthma, no pain with inspiration or cough,  no sleep apnea, no CPAP at night             GI:                               no difficulty swallowing, no reflux, no frequent heartburn, no hiatal hernia, no abdominal pain, no constipation, no diarrhea, no hematochezia, no hematemesis, no melena             GU:  no dysuria,  no frequency, no urinary tract infection, no hematuria, no enlarged prostate, no kidney stones, no kidney disease             Vascular:                     no pain suggestive of claudication, no pain in feet, no leg cramps, no varicose veins, no DVT, no non-healing foot ulcer             Neuro:                         no stroke, no TIA's, no seizures, no headaches, no temporary blindness one eye,  no slurred speech, no peripheral neuropathy, no chronic pain, no instability of gait, no memory/cognitive dysfunction             Musculoskeletal:         no arthritis, no joint swelling, no myalgias, no difficulty walking, normal mobility              Skin:                            no rash, no itching, no skin infections, no pressure sores or ulcerations             Psych:                         no anxiety, no depression, no nervousness, no unusual recent stress             Eyes:                           no blurry vision, no floaters, no recent vision changes, + wears glasses or contacts             ENT:                            no hearing loss, no loose or painful teeth, no dentures, last saw dentist October 2021             Hematologic:               no easy bruising, no abnormal bleeding, no clotting disorder, no frequent epistaxis             Endocrine:                   no diabetes, does not check CBG's at home                           Physical Exam:              BP (!) 147/74 (BP Location: Left Arm, Patient Position: Sitting)   Pulse 67   Resp 20   Ht 5\' 10"  (1.778 m)   Wt 173 lb (78.5 kg)   SpO2 99% Comment: RA with mask on  BMI 24.82 kg/m              General:                         well-appearing             HEENT:  Unremarkable              Neck:                           no JVD, no bruits, no adenopathy              Chest:                          clear to auscultation, symmetrical breath sounds, no wheezes, no rhonchi              CV:                              RRR, grade IV/VI holosystolic murmur              Abdomen:                    soft, non-tender, no masses              Extremities:                 warm, well-perfused, pulses palpable, no LE edema             Rectal/GU                   Deferred             Neuro:                         Grossly non-focal and symmetrical throughout             Skin:                            Clean and dry, no rashes, no breakdown   Diagnostic Tests:   ECHOCARDIOGRAM REPORT       Patient Name:  Thomas [name] Date of Exam: 10/18/2020  Medical Rec #: [id number]     Height:  Accession #:  [id number]    Weight:  Date of Birth: Jan 02, 1946     BSA:  Patient Age:  76 years     BP:      142/71 mmHg  Patient Gender: M         HR:      65 bpm.  Exam Location: [address]   Procedure: 2D Echo, Cardiac Doppler and Color Doppler                 MODIFIED REPORT:  This report was modified by Kirk Ruths MD on 10/18/2020 due to change.  Indications:   I05.9 Mitral valve disorder    History:     Patient has prior history of Echocardiogram examinations,  most          recent 07/03/2019. Cancer; Mitral Valve Prolapse.    Sonographer:   Marygrace Drought RCS  Referring Phys: [id number] Thana Farr SKAINS  Diagnosing Phys: Kirk Ruths MD   IMPRESSIONS    1. Normal LV function; bileaflet MVP (posterior leaflet > anterior  leaflet); likely severe MR but difficult to quantitate; suggest TEE to  further assess.  2. Left ventricular ejection fraction, by estimation, is 60 to 65%. The  left ventricle has normal function. The  left ventricle has no regional  wall motion abnormalities.  The left ventricular internal cavity size was  mildly dilated. Left ventricular  diastolic parameters are indeterminate. Elevated left atrial pressure.  3. Right ventricular systolic function is normal. The right ventricular  size is normal.  4. Left atrial size was moderately dilated.  5. The mitral valve is myxomatous. Moderate to severe mitral valve  regurgitation. No evidence of mitral stenosis. There is severe prolapse of  both leaflets of the mitral valve.  6. The aortic valve is tricuspid. Aortic valve regurgitation is trivial.  Mild aortic valve sclerosis is present, with no evidence of aortic valve  stenosis.  7. The inferior vena cava is normal in size with greater than 50%  respiratory variability, suggesting right atrial pressure of 3 mmHg.   FINDINGS  Left Ventricle: Left ventricular ejection fraction, by estimation, is 60  to 65%. The left ventricle has normal function. The left ventricle has no  regional wall motion abnormalities. The left ventricular internal cavity  size was mildly dilated. There is  no left ventricular hypertrophy. Left ventricular diastolic parameters  are indeterminate. Elevated left atrial pressure.   Right Ventricle: The right ventricular size is normal. Right ventricular  systolic function is normal.   Left Atrium: Left atrial size was moderately dilated.   Right Atrium: Right atrial size was normal in size.   Pericardium: There is no evidence of pericardial effusion.   Mitral Valve: The mitral valve is myxomatous. There is severe prolapse of  both leaflets of the mitral valve. Mild mitral annular calcification.  Moderate to severe mitral valve regurgitation. No evidence of mitral valve  stenosis.   Tricuspid Valve: The tricuspid valve is normal in structure. Tricuspid  valve regurgitation is trivial. No evidence of tricuspid stenosis.   Aortic Valve: The aortic valve is  tricuspid. Aortic valve regurgitation is  trivial. Mild aortic valve sclerosis is present, with no evidence of  aortic valve stenosis.   Pulmonic Valve: The pulmonic valve was normal in structure. Pulmonic valve  regurgitation is mild. No evidence of pulmonic stenosis.   Aorta: The aortic root is normal in size and structure.   Venous: The inferior vena cava is normal in size with greater than 50%  respiratory variability, suggesting right atrial pressure of 3 mmHg.   IAS/Shunts: No atrial level shunt detected by color flow Doppler.   Additional Comments: Normal LV function; bileaflet MVP (posterior leaflet  > anterior leaflet); likely severe MR but difficult to quantitate; suggest  TEE to further assess.   Kirk Ruths MD  Electronically signed by Kirk Ruths MD  Signature Date/Time: 10/18/2020/4:42:58 PM          TRANSESOPHOGEAL ECHO REPORT       Patient Name:  Thomas [name] Date of Exam: 11/23/2020  Medical Rec #: [id number]     Height:    70.0 in  Accession #:  [id number]    Weight:    177.0 lb  Date of Birth: 12-Mar-1946     BSA:     1.982 m  Patient Age:  58 years     BP:      118/52 mmHg  Patient Gender: M         HR:      78 bpm.  Exam Location: Inpatient   Procedure: Transesophageal Echo, 3D Echo, Color Doppler and Cardiac  Doppler   Indications:   Severe Mitral Regurgitation    History:     Patient has prior history of Echocardiogram examinations,  most  recent 10/18/2020. Mitral Valve Prolapse.    Sonographer:   Mikki Santee RDCS (AE)  Referring Phys: [id number] [name]  Diagnosing Phys: Eleonore Chiquito MD   PROCEDURE: After discussion of the risks and benefits of a TEE, an  informed consent was obtained from the patient. TEE procedure time was 35  minutes. The transesophogeal probe was passed without difficulty through  the esophogus of the patient.  Imaged  were obtained with the patient in a left lateral decubitus position. Local  oropharyngeal anesthetic was provided with Cetacaine. Sedation performed  by different physician. The patient was monitored while under deep  sedation. Anesthestetic sedation was  provided intravenously by Anesthesiology: 390mg  of Propofol. Image quality  was excellent. The patient's vital signs; including heart rate, blood  pressure, and oxygen saturation; remained stable throughout the procedure.  The patient developed no  complications during the procedure.   IMPRESSIONS    1. 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 There is severe prolapse of the P1/P2 segment without  apparent flail segment. The P1/P2 segment 2D ERO is 0.81 cm2, R vol 130  cc. The P1/P2 segment 3D VCA is  ~0.80 cm2. A second jet is present due to incomplete coaptation of the  A3/P3 segments with 2D ERO 0.15 cm2, R vol 17 cc. The 3D VCA of this is  0.23 cm2. In total, by 2D ERO ~0.96 cm2, R vol 147 cc. R vol by VTI is 148  cc. RF ~67%. There is systolic flow  reversal in the R upper and left upper pulmonary veins. Overall, severe  mitral regurgitation is present. The mitral valve is myxomatous. Severe  mitral valve regurgitation. No evidence of mitral stenosis.  2. 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 There is mild  TR related to leaflet prolapse that was difficult to quantitate. The  tricuspid valve is myxomatous.  3. Eccentric pulmonary regurgitation is present possibly due to leaflet  prolapse vs commissural. Pulmonary regurgitation is mild.  4. Left ventricular ejection fraction, by estimation, is 60 to 65%. The  left ventricle has normal function.  5. Right ventricular systolic function is normal. The right ventricular  size is normal.  6. Left atrial size was mild to moderately dilated. No left atrial/left  atrial appendage thrombus was detected.  The LAA emptying velocity was 70  cm/s.  7. The aortic valve is tricuspid. Aortic valve regurgitation is trivial.  No aortic stenosis is present.  8. There is mild (Grade II) layered plaque involving the descending  aorta.   FINDINGS  Left Ventricle: Left ventricular ejection fraction, by estimation, is 60  to 65%. The left ventricle has normal function. The left ventricular  internal cavity size was normal in size.   Right Ventricle: The right ventricular size is normal. No increase in  right ventricular wall thickness. Right ventricular systolic function is  normal.   Left Atrium: Left atrial size was mild to moderately dilated. No left  atrial/left atrial appendage thrombus was detected. The LAA emptying  velocity was 70 cm/s.   Right Atrium: Right atrial size was normal in size.   Pericardium: Trivial pericardial effusion is present.   Mitral Valve: The mitral valve is myxomatous with bileaflet prolapse  consistent with Barlow's disease. There is severe prolapse of the P1/P2  segment without apparent flail segment. The P1/P2 segment 2D ERO is 0.81  cm2, R vol 130 cc. The P1/P2 segment 3D  VCA is ~0.80 cm2. A  second jet is present due to incomplete coaptation of  the A3/P3 segments with 2D ERO 0.15 cm2, R vol 17 cc. The 3D VCA of this  is 0.23 cm2. In total, by 2D ERO ~0.96 cm2, R vol 147 cc. R vol by VTI is  148 cc. RF ~67%. There is  systolic flow reversal in the R upper and left upper pulmonary veins.  Overall, severe mitral regurgitation is present. The mitral valve is  myxomatous. Severe mitral valve regurgitation. No evidence of mitral valve  stenosis.   Tricuspid Valve: The tricuspid valve is myxomatous with leaflet prolapse.  There is mild TR related to leaflet prolapse that was difficult to  quantitate. The tricuspid valve is myxomatous. Tricuspid valve  regurgitation is mild . No evidence of tricuspid  stenosis.   Aortic Valve: The aortic valve is tricuspid.  Aortic valve regurgitation is  trivial. No aortic stenosis is present.   Pulmonic Valve: Eccentric pulmonary regurgitation is present possibly due  to leaflet prolapse vs commissural. Pulmonary regurgitation is mild. The  pulmonic valve was grossly normal. Pulmonic valve regurgitation is mild.  No evidence of pulmonic stenosis.   Aorta: The aortic root is normal in size and structure. There is mild  (Grade II) layered plaque involving the descending aorta.   Venous: A pattern of systolic flow reversal, suggestive of severe mitral  regurgitation is recorded from the left upper pulmonary vein and the right  upper pulmonary vein.   IAS/Shunts: The atrial septum is grossly normal.     LEFT VENTRICLE  PLAX 2D  LVOT diam:   2.30 cm  LV SV:     73  LV SV Index:  37    3D Volume EF  LVOT Area:   4.15 cm LV 3D EDV:  108.65 ml     AORTIC VALVE  LVOT Vmax:  98.39 cm/s  LVOT Vmean: 67.750 cm/s  LVOT VTI:  0.175 m    AORTA  Ao Root diam: 3.45 cm  Ao Asc diam: 2.96 cm   MR Peak grad:  135.0 mmHg TRICUSPID VALVE  MR Mean grad:  74.7 mmHg  TR Peak grad:  23.2 mmHg  MR Vmax:     581.00 cm/s TR Vmax:    241.00 cm/s  MR Vmean:    395.3 cm/s  MR PISA:     9.05 cm  SHUNTS  MR PISA Eff ROA: 60 mm   Systemic VTI: 0.18 m  MR PISA Radius: 1.20 cm   Systemic Diam: 2.30 cm   Eleonore Chiquito MD  Electronically signed by Eleonore Chiquito MD  Signature Date/Time: 11/23/2020/10:51:18 PM       RIGHT/LEFT HEART CATH AND CORONARY ANGIOGRAPHY    Conclusion  1.  Patent coronary arteries with mild nonobstructive narrowing at the ostium of the left main and ostium of the left circumflex, no significant stenosis present. 2.  Known severe mitral regurgitation by noninvasive assessment with normal pulmonary capillary wedge pressure and pulmonary artery pressures, preserved cardiac output  Recommend: Continued surgical evaluation for consideration  of mitral valve repair  Indications  Nonrheumatic mitral valve regurgitation [I34.0 (ICD-10-CM)]   Procedural Details  Technical Details INDICATION: Severe mitral regurgitation  PROCEDURAL DETAILS: There was an indwelling IV in a right antecubital vein. Using normal sterile technique, the IV was changed out for a 5 Fr brachial sheath over a 0.018 inch wire. The right wrist was then prepped, draped, and anesthetized with 1% lidocaine. Using the modified Seldinger technique a 5/6 French Slender sheath  was placed in the right radial artery. Intra-arterial verapamil was administered through the radial artery sheath. IV heparin was administered after a JR4 catheter was advanced into the central aorta. A Swan-Ganz catheter was used for the right heart catheterization. Standard protocol was followed for recording of right heart pressures and sampling of oxygen saturations. Fick cardiac output was calculated. Standard Judkins catheters were used for selective coronary angiography. LV pressure is recorded and an aortic valve pullback is performed. There were no immediate procedural complications. The patient was transferred to the post catheterization recovery area for further monitoring.    Estimated blood loss <50 mL.   During this procedure medications were administered to achieve and maintain moderate conscious sedation while the patient's heart rate, blood pressure, and oxygen saturation were continuously monitored and I was present face-to-face 100% of this time.   Medications (Filter: Administrations occurring from 0848 to 0939 on 12/23/20)  Heparin (Porcine) in NaCl 1000-0.9 UT/500ML-% SOLN (mL) Total volume:  1,000 mL  Date/Time Rate/Dose/Volume Action   12/23/20 0854 500 mL Given   0854 500 mL Given    midazolam (VERSED) injection (mg) Total dose:  2 mg  Date/Time Rate/Dose/Volume Action   12/23/20 0906 1 mg Given   0917 1 mg Given    fentaNYL (SUBLIMAZE) injection (mcg) Total  dose:  50 mcg  Date/Time Rate/Dose/Volume Action   12/23/20 0906 25 mcg Given   0917 25 mcg Given    lidocaine (PF) (XYLOCAINE) 1 % injection (mL) Total volume:  7 mL  Date/Time Rate/Dose/Volume Action   12/23/20 0914 2 mL Given   0915 5 mL Given    Radial Cocktail/Verapamil only (mL) Total volume:  10 mL  Date/Time Rate/Dose/Volume Action   12/23/20 0916 10 mL Given    heparin sodium (porcine) injection (Units) Total dose:  4,000 Units  Date/Time Rate/Dose/Volume Action   12/23/20 0922 4,000 Units Given    iohexol (OMNIPAQUE) 350 MG/ML injection (mL) Total volume:  50 mL  Date/Time Rate/Dose/Volume Action   12/23/20 0936 50 mL Given     Sedation Time  Sedation Time Physician-1: 25 minutes   Contrast  Medication Name Total Dose  iohexol (OMNIPAQUE) 350 MG/ML injection 50 mL    Radiation/Fluoro  Fluoro time: 3.4 (min) DAP: 12466 (mGycm2) Cumulative Air Kerma: 244 (mGy)   Coronary Findings   Diagnostic Dominance: Right  Left Main  Ost LM lesion is 30% stenosed. There is mild ostial narrowing of the left mainstem, estimated stenosis no greater than 30%.  Left Anterior Descending  The LAD is patent throughout with minor luminal irregularities. The first septal perforator gives off a septal cascade.  Left Circumflex  Ost Cx to Prox Cx lesion is 30% stenosed. The circumflex is widely patent with mild ostial narrowing of approximately 25 to 30%. There is no significant stenosis present. There are 2 obtuse marginal branches supplied by the circumflex.  Right Coronary Artery  Vessel is large. The vessel exhibits minimal luminal irregularities. The right coronary artery is a large, dominant vessel. Distally the vessel divides into the PDA and posterior AV segment which supplies a single posterolateral branch. There are mild irregularities with no significant stenoses throughout the RCA distribution.   Intervention   No interventions have been  documented.  Right Heart  Right Heart Pressures LV EDP is normal.   Coronary Diagrams   Diagnostic Dominance: Right    Intervention    Implants    No implant documentation for this case.    Syngo Images  Show images for CARDIAC CATHETERIZATION  Images on Long Term Storage  Show images for Thomas [name]  Link to Procedure Log  Procedure Log     Hemo Data  Flowsheet Row Most Recent Value  Fick Cardiac Output 6.37 L/min  Fick Cardiac Output Index 3.24 (L/min)/BSA  RA A Wave 6 mmHg  RA V Wave 5 mmHg  RA Mean 2 mmHg  RV Systolic Pressure 33 mmHg  RV Diastolic Pressure 0 mmHg  RV EDP 6 mmHg  PA Systolic Pressure 31 mmHg  PA Diastolic Pressure 11 mmHg  PA Mean 22 mmHg  PW A Wave 15 mmHg  PW V Wave 19 mmHg  PW Mean 13 mmHg  AO Systolic Pressure 400 mmHg  AO Diastolic Pressure 59 mmHg  AO Mean 84 mmHg  LV Systolic Pressure 867 mmHg  LV Diastolic Pressure 6 mmHg  LV EDP 13 mmHg  AOp Systolic Pressure 619 mmHg  AOp Diastolic Pressure 62 mmHg  AOp Mean Pressure 85 mmHg  LVp Systolic Pressure 509 mmHg  LVp Diastolic Pressure 5 mmHg  LVp EDP Pressure 11 mmHg  QP/QS 1  TPVR Index 6.8 HRUI  TSVR Index 25.95 HRUI  PVR SVR Ratio 0.11  TPVR/TSVR Ratio 0.26      CT ANGIOGRAPHY CHEST, ABDOMEN AND PELVIS  TECHNIQUE: Multidetector CT imaging through the chest, abdomen and pelvis was performed using the standard protocol during bolus administration of intravenous contrast. Multiplanar reconstructed images and MIPs were obtained and reviewed to evaluate the vascular anatomy.  CONTRAST: 5mL ISOVUE-370 IOPAMIDOL (ISOVUE-370) INJECTION 76%  COMPARISON: None.  FINDINGS: CTA CHEST FINDINGS  Cardiovascular: The thoracic aorta is of normal caliber. No intramural hematoma, aneurysm, or dissection. No significant atherosclerotic calcification. Arch vasculature demonstrates normal anatomic configuration and is widely patent proximally.  No  significant coronary artery calcification. Global cardiac size within normal limits. No pericardial effusion. Central pulmonary arteries are of normal caliber.  Mediastinum/Nodes: No enlarged mediastinal, hilar, or axillary lymph nodes. Thyroid gland, trachea, and esophagus demonstrate no significant findings.  Lungs/Pleura: Mild bibasilar atelectasis. The lungs are otherwise clear. No focal pulmonary nodule or infiltrate. No pneumothorax or pleural effusion. The central airways are widely patent.  Musculoskeletal: No acute bone abnormality. No suspicious lytic or blastic bone lesion. Probable bone island within the posterior aspect of the T7 vertebral body.  Review of the MIP images confirms the above findings.  CTA ABDOMEN AND PELVIS FINDINGS  VASCULAR  Aorta: Normal caliber aorta without aneurysm, dissection, vasculitis or significant stenosis.  Celiac: Less than 50% stenosis at its origin secondary to extrinsic compression. Otherwise widely patent. Normal anatomic configuration. No significant atherosclerotic disease.  SMA: Widely patent.  Renals: Dual right and single left renal arteries demonstrate wide patency, normal vascular morphology, and no aneurysm.  IMA: Widely patent  Inflow: Minimal atherosclerotic calcification within the left common iliac artery. Widely patent without hemodynamically significant stenosis, aneurysm, or dissection.  Veins: Not well opacified. Duplicated inferior vena cava noted, a normal anatomic variant.  Review of the MIP images confirms the above findings.  NON-VASCULAR  Hepatobiliary: No focal liver abnormality is seen. No gallstones, gallbladder wall thickening, or biliary dilatation.  Pancreas: Unremarkable  Spleen: Unremarkable  Adrenals/Urinary Tract: The adrenal glands are unremarkable. The kidneys are normal. The bladder is largely decompressed. There is, however, mild circumferential bladder wall  thickening suggesting changes of at least mild chronic bladder outlet obstruction.  Stomach/Bowel: Stomach is within normal limits. Appendix appears normal. No evidence of bowel wall thickening, distention, or  inflammatory changes. No free intraperitoneal gas or fluid.  Lymphatic: No pathologic adenopathy within the abdomen and pelvis.  Reproductive: Marked prostatic enlargement with an estimated volume of 93 cc. Seminal vesicles are unremarkable.  Other: Bilateral inguinal hernia repair with mesh has been performed. Rectum unremarkable.  Musculoskeletal: No acute bone abnormality within the abdomen and pelvis. No lytic or blastic bone lesion identified.  Review of the MIP images confirms the above findings.  IMPRESSION: No evidence of thoracoabdominal aortic aneurysm or dissection. Minimal atherosclerotic disease.  Duplicated inferior vena cava, a normal anatomic variant.  Marked prostatic enlargement. Mild bladder wall thickening suggest changes of at least mild chronic bladder outlet obstruction. The bladder, however, is decompressed.   Electronically Signed By: Fidela Salisbury MD On: 12/13/2020 08:57   Impression:  Patient has mitral valve prolapse with stage C severe asymptomatic primary mitral regurgitation. Although he does not exercise on a regular basis the patient remains reasonably active physically and he specifically denies any symptoms of exertional shortness of breath, chest discomfort, or fatigue.  I have personally reviewed the patient's recent transthoracic and transesophageal echocardiograms diagnostic cardiac catheterization, and CT angiogram. 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 There is severe prolapse involving the P2 and P3 segments of the posterior leaflet. There may be at least 1 ruptured primary chordae tendinae. There  does not appear to be significant calcification. Left ventricular size and systolic function remains normal. There is moderate left atrial enlargement. No other significant abnormalities are noted.  Catheterization revealed mild nonobstructive coronary artery disease and preserved cardiac output with very mildly elevated right heart pressures.  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  Options include continued close observation on medical therapy versus elective mitral valve repair. Based upon review of the patient's recent echocardiograms I feel there is better than 95% likelihood of successful valve repair. Operative risks should be quite low with the patient's only significant risk factor being his age. The patient may be a good candidate for minimally invasive approach for surgery although there is reported history of left-sided vena cava noted in his past medical history. The patient does not have significant aortic insufficiency.   Plan:  The patient was again counseled at length regarding diagnosis of severe primary mitral regurgitation. We reviewed the results of their diagnostic tests including images from the most recent echocardiogram. We discussed the natural history of mitral regurgitation as well as alternative treatment strategies. We discussed the impact of hisage, current state of health, and apparent lack ofsignificant comorbid medical problems on clinical decision making. We went on to discuss the indications, risks and potential benefits of mitral valve repair as well as the timing of surgical intervention. The rationale for elective surgery has been explained, including a comparison between surgery and continued medical therapy with close follow-up. The likelihood of successful and durable mitral valve repair has been discussed with particular reference to the  findings of the most recent echocardiogram. Based upon these findings and previous experience, I have quoted a greater than 95percent likelihood of successful valve repair with less than 1percent risk of mortality or major morbidity. Alternative surgical approaches have been discussed including a comparison between conventional sternotomy and minimally-invasive techniques. The relative risks and benefits of each have been reviewed as they pertain to the patient's specific circumstances, and expectations for the patient's postoperative convalescence has been discussed, including  the fact that because he lives alone he will need to have family member or close friend present to stay with him 24 hours a day when he first leaves the hospital.. The patient desires to proceed with elective mitral valve repair in the near future.  We tentatively plan for surgery on January 24, 2021.  The patient understands and accepts all potential risks of surgery including but not limited to risk of death, stroke or other neurologic complication, myocardial infarction, congestive heart failure, respiratory failure, renal failure, bleeding requiring transfusion and/or reexploration, arrhythmia, heart block or bradycardia requiring permanent pacemaker insertion, infection or other wound complications, pneumonia, pleural and/or pericardial effusion, pulmonary embolus, aortic dissection or other major vascular complication, or other immediate or delayed complications related to valve repair or replacement including but not limited to recurrent or persistent mitral regurgitation and/or mitral stenosis, LV outflow tract obstruction, aortic insufficiency, paravalvular leak, posterior AV groove disruption, structural valve deterioration and failure, thrombosis, embolization, or endocarditis.  Specific risks potentially related to the minimally-invasive approach were discussed at length, including but not limited to risk of conversion to  full or partial sternotomy, aortic dissection or other major vascular complication, unilateral acute lung injury or pulmonary edema, phrenic nerve dysfunction or paralysis, rib fracture, chronic pain, lung hernia, or lymphocele. All of his questions have been answered.      Valentina Gu. Roxy Manns, MD 01/09/2021 10:05 AM

## 2021-01-24 ENCOUNTER — Inpatient Hospital Stay (HOSPITAL_COMMUNITY): Payer: PPO

## 2021-01-24 ENCOUNTER — Other Ambulatory Visit: Payer: Self-pay

## 2021-01-24 ENCOUNTER — Encounter (HOSPITAL_COMMUNITY)
Admission: RE | Disposition: A | Discharge status: Home / Self Care | Payer: Self-pay | Source: Home / Self Care | Attending: Thoracic Surgery (Cardiothoracic Vascular Surgery)

## 2021-01-24 ENCOUNTER — Inpatient Hospital Stay (HOSPITAL_COMMUNITY)
Admission: RE | Admit: 2021-01-24 | Discharge: 2021-01-30 | DRG: 220 | Disposition: A | Discharge status: Home / Self Care | Payer: PPO | Attending: Thoracic Surgery (Cardiothoracic Vascular Surgery) | Admitting: Thoracic Surgery (Cardiothoracic Vascular Surgery)

## 2021-01-24 ENCOUNTER — Encounter (HOSPITAL_COMMUNITY): Payer: Self-pay | Admitting: Thoracic Surgery (Cardiothoracic Vascular Surgery)

## 2021-01-24 DIAGNOSIS — C61 Malignant neoplasm of prostate: Secondary | ICD-10-CM | POA: Diagnosis not present

## 2021-01-24 DIAGNOSIS — N400 Enlarged prostate without lower urinary tract symptoms: Secondary | ICD-10-CM | POA: Diagnosis present

## 2021-01-24 DIAGNOSIS — R000 Tachycardia, unspecified: Secondary | ICD-10-CM | POA: Diagnosis not present

## 2021-01-24 DIAGNOSIS — E876 Hypokalemia: Secondary | ICD-10-CM | POA: Diagnosis not present

## 2021-01-24 DIAGNOSIS — R531 Weakness: Secondary | ICD-10-CM | POA: Diagnosis not present

## 2021-01-24 DIAGNOSIS — I348 Other nonrheumatic mitral valve disorders: Secondary | ICD-10-CM | POA: Diagnosis not present

## 2021-01-24 DIAGNOSIS — I341 Nonrheumatic mitral (valve) prolapse: Secondary | ICD-10-CM | POA: Diagnosis not present

## 2021-01-24 DIAGNOSIS — M1990 Unspecified osteoarthritis, unspecified site: Secondary | ICD-10-CM | POA: Diagnosis present

## 2021-01-24 DIAGNOSIS — Z7901 Long term (current) use of anticoagulants: Secondary | ICD-10-CM | POA: Diagnosis not present

## 2021-01-24 DIAGNOSIS — I10 Essential (primary) hypertension: Secondary | ICD-10-CM | POA: Diagnosis present

## 2021-01-24 DIAGNOSIS — Z9889 Other specified postprocedural states: Secondary | ICD-10-CM

## 2021-01-24 DIAGNOSIS — D62 Acute posthemorrhagic anemia: Secondary | ICD-10-CM | POA: Diagnosis not present

## 2021-01-24 DIAGNOSIS — J9811 Atelectasis: Secondary | ICD-10-CM | POA: Diagnosis not present

## 2021-01-24 DIAGNOSIS — K4020 Bilateral inguinal hernia, without obstruction or gangrene, not specified as recurrent: Secondary | ICD-10-CM | POA: Diagnosis not present

## 2021-01-24 DIAGNOSIS — J811 Chronic pulmonary edema: Secondary | ICD-10-CM | POA: Diagnosis not present

## 2021-01-24 DIAGNOSIS — J90 Pleural effusion, not elsewhere classified: Secondary | ICD-10-CM | POA: Diagnosis not present

## 2021-01-24 DIAGNOSIS — I2510 Atherosclerotic heart disease of native coronary artery without angina pectoris: Secondary | ICD-10-CM | POA: Diagnosis not present

## 2021-01-24 DIAGNOSIS — R319 Hematuria, unspecified: Secondary | ICD-10-CM | POA: Diagnosis not present

## 2021-01-24 DIAGNOSIS — I340 Nonrheumatic mitral (valve) insufficiency: Secondary | ICD-10-CM | POA: Diagnosis not present

## 2021-01-24 DIAGNOSIS — R110 Nausea: Secondary | ICD-10-CM | POA: Diagnosis not present

## 2021-01-24 DIAGNOSIS — R338 Other retention of urine: Secondary | ICD-10-CM | POA: Diagnosis not present

## 2021-01-24 DIAGNOSIS — I517 Cardiomegaly: Secondary | ICD-10-CM | POA: Diagnosis not present

## 2021-01-24 DIAGNOSIS — Z8546 Personal history of malignant neoplasm of prostate: Secondary | ICD-10-CM | POA: Diagnosis not present

## 2021-01-24 DIAGNOSIS — R791 Abnormal coagulation profile: Secondary | ICD-10-CM | POA: Diagnosis not present

## 2021-01-24 DIAGNOSIS — Z79899 Other long term (current) drug therapy: Secondary | ICD-10-CM

## 2021-01-24 DIAGNOSIS — I088 Other rheumatic multiple valve diseases: Secondary | ICD-10-CM | POA: Diagnosis not present

## 2021-01-24 HISTORY — PX: TEE WITHOUT CARDIOVERSION: SHX5443

## 2021-01-24 HISTORY — PX: MITRAL VALVE REPAIR: SHX2039

## 2021-01-24 HISTORY — DX: Other specified postprocedural states: Z98.890

## 2021-01-24 LAB — POCT I-STAT, CHEM 8
BUN: 24 mg/dL — ABNORMAL HIGH (ref 8–23)
BUN: 22 mg/dL (ref 8–23)
BUN: 24 mg/dL — ABNORMAL HIGH (ref 8–23)
BUN: 21 mg/dL (ref 8–23)
BUN: 21 mg/dL (ref 8–23)
BUN: 19 mg/dL (ref 8–23)
Calcium, Ion: 1.29 mmol/L (ref 1.15–1.40)
Calcium, Ion: 1.18 mmol/L (ref 1.15–1.40)
Calcium, Ion: 1.3 mmol/L (ref 1.15–1.40)
Calcium, Ion: 1.14 mmol/L — ABNORMAL LOW (ref 1.15–1.40)
Calcium, Ion: 1.08 mmol/L — ABNORMAL LOW (ref 1.15–1.40)
Calcium, Ion: 1.09 mmol/L — ABNORMAL LOW (ref 1.15–1.40)
Chloride: 101 mmol/L (ref 98–111)
Chloride: 102 mmol/L (ref 98–111)
Chloride: 104 mmol/L (ref 98–111)
Chloride: 103 mmol/L (ref 98–111)
Chloride: 105 mmol/L (ref 98–111)
Chloride: 104 mmol/L (ref 98–111)
Creatinine, Ser: 0.7 mg/dL (ref 0.61–1.24)
Creatinine, Ser: 0.6 mg/dL — ABNORMAL LOW (ref 0.61–1.24)
Creatinine, Ser: 0.6 mg/dL — ABNORMAL LOW (ref 0.61–1.24)
Creatinine, Ser: 0.6 mg/dL — ABNORMAL LOW (ref 0.61–1.24)
Creatinine, Ser: 0.6 mg/dL — ABNORMAL LOW (ref 0.61–1.24)
Creatinine, Ser: 0.6 mg/dL — ABNORMAL LOW (ref 0.61–1.24)
Glucose, Bld: 116 mg/dL — ABNORMAL HIGH (ref 70–99)
Glucose, Bld: 101 mg/dL — ABNORMAL HIGH (ref 70–99)
Glucose, Bld: 118 mg/dL — ABNORMAL HIGH (ref 70–99)
Glucose, Bld: 109 mg/dL — ABNORMAL HIGH (ref 70–99)
Glucose, Bld: 111 mg/dL — ABNORMAL HIGH (ref 70–99)
Glucose, Bld: 102 mg/dL — ABNORMAL HIGH (ref 70–99)
HCT: 35 % — ABNORMAL LOW (ref 39.0–52.0)
HCT: 30 % — ABNORMAL LOW (ref 39.0–52.0)
HCT: 37 % — ABNORMAL LOW (ref 39.0–52.0)
HCT: 27 % — ABNORMAL LOW (ref 39.0–52.0)
HCT: 26 % — ABNORMAL LOW (ref 39.0–52.0)
HCT: 27 % — ABNORMAL LOW (ref 39.0–52.0)
Hemoglobin: 11.9 g/dL — ABNORMAL LOW (ref 13.0–17.0)
Hemoglobin: 10.2 g/dL — ABNORMAL LOW (ref 13.0–17.0)
Hemoglobin: 12.6 g/dL — ABNORMAL LOW (ref 13.0–17.0)
Hemoglobin: 9.2 g/dL — ABNORMAL LOW (ref 13.0–17.0)
Hemoglobin: 8.8 g/dL — ABNORMAL LOW (ref 13.0–17.0)
Hemoglobin: 9.2 g/dL — ABNORMAL LOW (ref 13.0–17.0)
Potassium: 3.7 mmol/L (ref 3.5–5.1)
Potassium: 4.2 mmol/L (ref 3.5–5.1)
Potassium: 4.6 mmol/L (ref 3.5–5.1)
Potassium: 4.5 mmol/L (ref 3.5–5.1)
Potassium: 5.1 mmol/L (ref 3.5–5.1)
Potassium: 4.2 mmol/L (ref 3.5–5.1)
Sodium: 139 mmol/L (ref 135–145)
Sodium: 138 mmol/L (ref 135–145)
Sodium: 138 mmol/L (ref 135–145)
Sodium: 139 mmol/L (ref 135–145)
Sodium: 138 mmol/L (ref 135–145)
Sodium: 140 mmol/L (ref 135–145)
TCO2: 27 mmol/L (ref 22–32)
TCO2: 28 mmol/L (ref 22–32)
TCO2: 29 mmol/L (ref 22–32)
TCO2: 29 mmol/L (ref 22–32)
TCO2: 27 mmol/L (ref 22–32)
TCO2: 24 mmol/L (ref 22–32)

## 2021-01-24 LAB — BASIC METABOLIC PANEL
Anion gap: 6 (ref 5–15)
BUN: 17 mg/dL (ref 8–23)
CO2: 23 mmol/L (ref 22–32)
Calcium: 7.5 mg/dL — ABNORMAL LOW (ref 8.9–10.3)
Chloride: 107 mmol/L (ref 98–111)
Creatinine, Ser: 0.76 mg/dL (ref 0.61–1.24)
GFR, Estimated: >60 mL/min (ref >60)
Glucose, Bld: 112 mg/dL — ABNORMAL HIGH (ref 70–99)
Potassium: 3.9 mmol/L (ref 3.5–5.1)
Sodium: 136 mmol/L (ref 135–145)

## 2021-01-24 LAB — POCT I-STAT 7, (LYTES, BLD GAS, ICA,H+H)
Acid-Base Excess: 2 mmol/L (ref 0.0–2.0)
Acid-Base Excess: 3 mmol/L — ABNORMAL HIGH (ref 0.0–2.0)
Acid-base deficit: 2 mmol/L (ref 0.0–2.0)
Acid-base deficit: 2 mmol/L (ref 0.0–2.0)
Acid-base deficit: 5 mmol/L — ABNORMAL HIGH (ref 0.0–2.0)
Acid-base deficit: 3 mmol/L — ABNORMAL HIGH (ref 0.0–2.0)
Acid-base deficit: 5 mmol/L — ABNORMAL HIGH (ref 0.0–2.0)
Bicarbonate: 24.5 mmol/L (ref 20.0–28.0)
Bicarbonate: 27 mmol/L (ref 20.0–28.0)
Bicarbonate: 28.7 mmol/L — ABNORMAL HIGH (ref 20.0–28.0)
Bicarbonate: 23.3 mmol/L (ref 20.0–28.0)
Bicarbonate: 24.8 mmol/L (ref 20.0–28.0)
Bicarbonate: 24 mmol/L (ref 20.0–28.0)
Bicarbonate: 23.2 mmol/L (ref 20.0–28.0)
Calcium, Ion: 1.27 mmol/L (ref 1.15–1.40)
Calcium, Ion: 1.06 mmol/L — ABNORMAL LOW (ref 1.15–1.40)
Calcium, Ion: 1.15 mmol/L (ref 1.15–1.40)
Calcium, Ion: 1.07 mmol/L — ABNORMAL LOW (ref 1.15–1.40)
Calcium, Ion: 1.16 mmol/L (ref 1.15–1.40)
Calcium, Ion: 1.13 mmol/L — ABNORMAL LOW (ref 1.15–1.40)
Calcium, Ion: 1.13 mmol/L — ABNORMAL LOW (ref 1.15–1.40)
HCT: 37 % — ABNORMAL LOW (ref 39.0–52.0)
HCT: 31 % — ABNORMAL LOW (ref 39.0–52.0)
HCT: 30 % — ABNORMAL LOW (ref 39.0–52.0)
HCT: 28 % — ABNORMAL LOW (ref 39.0–52.0)
HCT: 34 % — ABNORMAL LOW (ref 39.0–52.0)
HCT: 35 % — ABNORMAL LOW (ref 39.0–52.0)
HCT: 36 % — ABNORMAL LOW (ref 39.0–52.0)
Hemoglobin: 12.6 g/dL — ABNORMAL LOW (ref 13.0–17.0)
Hemoglobin: 10.5 g/dL — ABNORMAL LOW (ref 13.0–17.0)
Hemoglobin: 10.2 g/dL — ABNORMAL LOW (ref 13.0–17.0)
Hemoglobin: 9.5 g/dL — ABNORMAL LOW (ref 13.0–17.0)
Hemoglobin: 11.6 g/dL — ABNORMAL LOW (ref 13.0–17.0)
Hemoglobin: 11.9 g/dL — ABNORMAL LOW (ref 13.0–17.0)
Hemoglobin: 12.2 g/dL — ABNORMAL LOW (ref 13.0–17.0)
O2 Saturation: 100 %
O2 Saturation: 100 %
O2 Saturation: 100 %
O2 Saturation: 95 %
O2 Saturation: 95 %
O2 Saturation: 100 %
O2 Saturation: 97 %
Patient temperature: 35.7
Patient temperature: 35.6
Patient temperature: 36.1
Potassium: 3.7 mmol/L (ref 3.5–5.1)
Potassium: 4.2 mmol/L (ref 3.5–5.1)
Potassium: 4.6 mmol/L (ref 3.5–5.1)
Potassium: 4.2 mmol/L (ref 3.5–5.1)
Potassium: 4.4 mmol/L (ref 3.5–5.1)
Potassium: 4.2 mmol/L (ref 3.5–5.1)
Potassium: 4.2 mmol/L (ref 3.5–5.1)
Sodium: 139 mmol/L (ref 135–145)
Sodium: 140 mmol/L (ref 135–145)
Sodium: 138 mmol/L (ref 135–145)
Sodium: 139 mmol/L (ref 135–145)
Sodium: 141 mmol/L (ref 135–145)
Sodium: 140 mmol/L (ref 135–145)
Sodium: 139 mmol/L (ref 135–145)
TCO2: 26 mmol/L (ref 22–32)
TCO2: 28 mmol/L (ref 22–32)
TCO2: 30 mmol/L (ref 22–32)
TCO2: 24 mmol/L (ref 22–32)
TCO2: 27 mmol/L (ref 22–32)
TCO2: 26 mmol/L (ref 22–32)
TCO2: 25 mmol/L (ref 22–32)
pCO2 arterial: 46.5 mmHg (ref 32.0–48.0)
pCO2 arterial: 40.9 mmHg (ref 32.0–48.0)
pCO2 arterial: 51.2 mmHg — ABNORMAL HIGH (ref 32.0–48.0)
pCO2 arterial: 40 mmHg (ref 32.0–48.0)
pCO2 arterial: 64.6 mmHg — ABNORMAL HIGH (ref 32.0–48.0)
pCO2 arterial: 49.6 mmHg — ABNORMAL HIGH (ref 32.0–48.0)
pCO2 arterial: 51.3 mmHg — ABNORMAL HIGH (ref 32.0–48.0)
pH, Arterial: 7.329 — ABNORMAL LOW (ref 7.350–7.450)
pH, Arterial: 7.429 (ref 7.350–7.450)
pH, Arterial: 7.357 (ref 7.350–7.450)
pH, Arterial: 7.373 (ref 7.350–7.450)
pH, Arterial: 7.185 — CL (ref 7.350–7.450)
pH, Arterial: 7.286 — ABNORMAL LOW (ref 7.350–7.450)
pH, Arterial: 7.259 — ABNORMAL LOW (ref 7.350–7.450)
pO2, Arterial: 412 mmHg — ABNORMAL HIGH (ref 83.0–108.0)
pO2, Arterial: 350 mmHg — ABNORMAL HIGH (ref 83.0–108.0)
pO2, Arterial: 451 mmHg — ABNORMAL HIGH (ref 83.0–108.0)
pO2, Arterial: 80 mmHg — ABNORMAL LOW (ref 83.0–108.0)
pO2, Arterial: 93 mmHg (ref 83.0–108.0)
pO2, Arterial: 232 mmHg — ABNORMAL HIGH (ref 83.0–108.0)
pO2, Arterial: 106 mmHg (ref 83.0–108.0)

## 2021-01-24 LAB — POCT I-STAT EG7
Acid-Base Excess: 1 mmol/L (ref 0.0–2.0)
Bicarbonate: 27 mmol/L (ref 20.0–28.0)
Calcium, Ion: 1.14 mmol/L — ABNORMAL LOW (ref 1.15–1.40)
HCT: 31 % — ABNORMAL LOW (ref 39.0–52.0)
Hemoglobin: 10.5 g/dL — ABNORMAL LOW (ref 13.0–17.0)
O2 Saturation: 83 %
Potassium: 3.9 mmol/L (ref 3.5–5.1)
Sodium: 139 mmol/L (ref 135–145)
TCO2: 28 mmol/L (ref 22–32)
pCO2, Ven: 46.3 mmHg (ref 44.0–60.0)
pH, Ven: 7.374 (ref 7.250–7.430)
pO2, Ven: 49 mmHg — ABNORMAL HIGH (ref 32.0–45.0)

## 2021-01-24 LAB — ECHO INTRAOPERATIVE TEE
AR max vel: 3.75 cm2
AV Mean grad: 1 mmHg
AV Peak grad: 2.6 mmHg
Ao pk vel: 0.81 m/s
Area-P 1/2: 2.74 cm2
Height: 70 in
MV M vel: 5.67 m/s
MV Peak grad: 128.6 mmHg
MV Vena cont: 0.87 cm
Radius: 1.4 cm
S' Lateral: 3.78 cm
Weight: 2804.25 oz

## 2021-01-24 LAB — CBC
HCT: 33.9 % — ABNORMAL LOW (ref 39.0–52.0)
HCT: 32 % — ABNORMAL LOW (ref 39.0–52.0)
Hemoglobin: 11.4 g/dL — ABNORMAL LOW (ref 13.0–17.0)
Hemoglobin: 10.9 g/dL — ABNORMAL LOW (ref 13.0–17.0)
MCH: 33.9 pg (ref 26.0–34.0)
MCH: 33.6 pg (ref 26.0–34.0)
MCHC: 33.6 g/dL (ref 30.0–36.0)
MCHC: 34.1 g/dL (ref 30.0–36.0)
MCV: 100.9 fL — ABNORMAL HIGH (ref 80.0–100.0)
MCV: 98.8 fL (ref 80.0–100.0)
Platelets: 139 10*3/uL — ABNORMAL LOW (ref 150–400)
Platelets: 114 10*3/uL — ABNORMAL LOW (ref 150–400)
RBC: 3.36 MIL/uL — ABNORMAL LOW (ref 4.22–5.81)
RBC: 3.24 MIL/uL — ABNORMAL LOW (ref 4.22–5.81)
RDW: 12.8 % (ref 11.5–15.5)
RDW: 12.6 % (ref 11.5–15.5)
WBC: 21.7 10*3/uL — ABNORMAL HIGH (ref 4.0–10.5)
WBC: 17.7 10*3/uL — ABNORMAL HIGH (ref 4.0–10.5)
nRBC: 0 % (ref 0.0–0.2)
nRBC: 0 % (ref 0.0–0.2)

## 2021-01-24 LAB — GLUCOSE, CAPILLARY
Glucose-Capillary: 115 mg/dL — ABNORMAL HIGH (ref 70–99)
Glucose-Capillary: 107 mg/dL — ABNORMAL HIGH (ref 70–99)
Glucose-Capillary: 115 mg/dL — ABNORMAL HIGH (ref 70–99)
Glucose-Capillary: 120 mg/dL — ABNORMAL HIGH (ref 70–99)
Glucose-Capillary: 106 mg/dL — ABNORMAL HIGH (ref 70–99)
Glucose-Capillary: 108 mg/dL — ABNORMAL HIGH (ref 70–99)
Glucose-Capillary: 104 mg/dL — ABNORMAL HIGH (ref 70–99)

## 2021-01-24 LAB — PROTIME-INR
INR: 1.6 — ABNORMAL HIGH (ref 0.8–1.2)
Prothrombin Time: 18 seconds — ABNORMAL HIGH (ref 11.4–15.2)

## 2021-01-24 LAB — APTT: aPTT: 37 seconds — ABNORMAL HIGH (ref 24–36)

## 2021-01-24 LAB — HEMOGLOBIN AND HEMATOCRIT, BLOOD
HCT: 27.3 % — ABNORMAL LOW (ref 39.0–52.0)
Hemoglobin: 9.1 g/dL — ABNORMAL LOW (ref 13.0–17.0)

## 2021-01-24 LAB — PREPARE RBC (CROSSMATCH)

## 2021-01-24 LAB — ABO/RH: ABO/RH(D): A POS

## 2021-01-24 LAB — MAGNESIUM: Magnesium: 2.8 mg/dL — ABNORMAL HIGH (ref 1.7–2.4)

## 2021-01-24 LAB — PLATELET COUNT: Platelets: 122 10*3/uL — ABNORMAL LOW (ref 150–400)

## 2021-01-24 SURGERY — REPAIR, MITRAL VALVE, MINIMALLY INVASIVE
Anesthesia: General | Site: Chest | Laterality: Right

## 2021-01-24 MED ORDER — PANTOPRAZOLE SODIUM 40 MG PO TBEC
40.0000 mg | DELAYED_RELEASE_TABLET | Freq: Every day | ORAL | Status: DC
  Administered 2021-01-26 – 2021-01-30 (×5): 40 mg via ORAL
  Filled 2021-01-24 (×5): qty 1

## 2021-01-24 MED ORDER — ALBUMIN HUMAN 5 % IV SOLN: INTRAVENOUS | Status: DC | PRN

## 2021-01-24 MED ORDER — VANCOMYCIN HCL 1000 MG IV SOLR
INTRAVENOUS | Status: DC | PRN
  Administered 2021-01-24: 1000 mL

## 2021-01-24 MED ORDER — PROTAMINE SULFATE 10 MG/ML IV SOLN
INTRAVENOUS | Status: DC | PRN
  Administered 2021-01-24: 10 mg via INTRAVENOUS
  Administered 2021-01-24: 270 mg via INTRAVENOUS

## 2021-01-24 MED ORDER — PROPOFOL 10 MG/ML IV BOLUS
INTRAVENOUS | Status: AC
  Filled 2021-01-24: qty 20

## 2021-01-24 MED ORDER — PLASMA-LYTE 148 IV SOLN
INTRAVENOUS | Status: DC | PRN
  Administered 2021-01-24: 500 mL via INTRAVASCULAR

## 2021-01-24 MED ORDER — MIDAZOLAM HCL 5 MG/5ML IJ SOLN
INTRAMUSCULAR | Status: DC | PRN
  Administered 2021-01-24: 2 mg via INTRAVENOUS
  Administered 2021-01-24: 1 mg via INTRAVENOUS
  Administered 2021-01-24: 2 mg via INTRAVENOUS
  Administered 2021-01-24: 1 mg via INTRAVENOUS

## 2021-01-24 MED ORDER — ORAL CARE MOUTH RINSE
15.0000 mL | Freq: Two times a day (BID) | OROMUCOSAL | Status: DC
  Administered 2021-01-24 – 2021-01-30 (×10): 15 mL via OROMUCOSAL

## 2021-01-24 MED ORDER — BUPIVACAINE LIPOSOME 1.3 % IJ SUSP
INTRAMUSCULAR | Status: AC
  Filled 2021-01-24: qty 20

## 2021-01-24 MED ORDER — SODIUM CHLORIDE 0.9% IV SOLUTION: Freq: Once | INTRAVENOUS | Status: DC

## 2021-01-24 MED ORDER — FENTANYL CITRATE (PF) 250 MCG/5ML IJ SOLN
INTRAMUSCULAR | Status: AC
  Filled 2021-01-24: qty 15

## 2021-01-24 MED ORDER — BUPIVACAINE LIPOSOME 1.3 % IJ SUSP
INTRAMUSCULAR | Status: DC | PRN
  Administered 2021-01-24: 50 mL

## 2021-01-24 MED ORDER — FAMOTIDINE IN NACL 20-0.9 MG/50ML-% IV SOLN
20.0000 mg | Freq: Two times a day (BID) | INTRAVENOUS | Status: DC
  Administered 2021-01-24: 20 mg via INTRAVENOUS
  Filled 2021-01-24: qty 50

## 2021-01-24 MED ORDER — CHLORHEXIDINE GLUCONATE CLOTH 2 % EX PADS
6.0000 | MEDICATED_PAD | Freq: Every day | CUTANEOUS | Status: DC
  Administered 2021-01-25 – 2021-01-30 (×6): 6 via TOPICAL

## 2021-01-24 MED ORDER — MORPHINE SULFATE (PF) 2 MG/ML IV SOLN
1.0000 mg | INTRAVENOUS | Status: DC | PRN
Start: 2021-01-24 — End: 2021-01-30
  Administered 2021-01-24 – 2021-01-27 (×5): 2 mg via INTRAVENOUS
  Filled 2021-01-24 (×5): qty 1

## 2021-01-24 MED ORDER — FENTANYL CITRATE (PF) 250 MCG/5ML IJ SOLN
INTRAMUSCULAR | Status: AC
  Filled 2021-01-24: qty 5

## 2021-01-24 MED ORDER — ACETAMINOPHEN 160 MG/5ML PO SOLN: 650.0000 mg | Freq: Once | ORAL | Status: AC

## 2021-01-24 MED ORDER — DEXTROSE 50 % IV SOLN: 0.0000 mL | INTRAVENOUS | Status: DC | PRN

## 2021-01-24 MED ORDER — LIDOCAINE 2% (20 MG/ML) 5 ML SYRINGE
INTRAMUSCULAR | Status: AC
  Filled 2021-01-24: qty 5

## 2021-01-24 MED ORDER — HEPARIN SODIUM (PORCINE) 1000 UNIT/ML IJ SOLN
INTRAMUSCULAR | Status: DC | PRN
  Administered 2021-01-24: 28000 [IU] via INTRAVENOUS

## 2021-01-24 MED ORDER — CHLORHEXIDINE GLUCONATE 0.12 % MT SOLN
15.0000 mL | OROMUCOSAL | Status: AC
  Administered 2021-01-24: 15 mL via OROMUCOSAL
  Filled 2021-01-24: qty 15

## 2021-01-24 MED ORDER — INSULIN REGULAR(HUMAN) IN NACL 100-0.9 UT/100ML-% IV SOLN
INTRAVENOUS | Status: DC
  Administered 2021-01-24: 0.9 [IU]/h via INTRAVENOUS

## 2021-01-24 MED ORDER — DEXMEDETOMIDINE HCL IN NACL 400 MCG/100ML IV SOLN: 0.0000 ug/kg/h | INTRAVENOUS | Status: DC

## 2021-01-24 MED ORDER — CHLORHEXIDINE GLUCONATE 0.12 % MT SOLN
15.0000 mL | Freq: Once | OROMUCOSAL | Status: AC
  Administered 2021-01-24: 15 mL via OROMUCOSAL
  Filled 2021-01-24: qty 15

## 2021-01-24 MED ORDER — MAGNESIUM SULFATE 4 GM/100ML IV SOLN
4.0000 g | Freq: Once | INTRAVENOUS | Status: AC
  Administered 2021-01-24: 4 g via INTRAVENOUS
  Filled 2021-01-24: qty 100

## 2021-01-24 MED ORDER — VANCOMYCIN HCL IN DEXTROSE 1-5 GM/200ML-% IV SOLN
1000.0000 mg | Freq: Once | INTRAVENOUS | Status: AC
  Administered 2021-01-24: 1000 mg via INTRAVENOUS
  Filled 2021-01-24: qty 200

## 2021-01-24 MED ORDER — SODIUM CHLORIDE 0.9% FLUSH
3.0000 mL | Freq: Two times a day (BID) | INTRAVENOUS | Status: DC
  Administered 2021-01-25 – 2021-01-30 (×8): 3 mL via INTRAVENOUS

## 2021-01-24 MED ORDER — ONDANSETRON HCL 4 MG/2ML IJ SOLN
INTRAMUSCULAR | Status: DC | PRN
  Administered 2021-01-24: 4 mg via INTRAVENOUS

## 2021-01-24 MED ORDER — SODIUM CHLORIDE 0.9 % IV SOLN: INTRAVENOUS | Status: DC

## 2021-01-24 MED ORDER — LACTATED RINGERS IV SOLN
500.0000 mL | Freq: Once | INTRAVENOUS | Status: AC | PRN
  Administered 2021-01-25: 500 mL via INTRAVENOUS

## 2021-01-24 MED ORDER — BUPIVACAINE HCL (PF) 0.5 % IJ SOLN
INTRAMUSCULAR | Status: AC
  Filled 2021-01-24: qty 30

## 2021-01-24 MED ORDER — ONDANSETRON HCL 4 MG/2ML IJ SOLN
4.0000 mg | Freq: Four times a day (QID) | INTRAMUSCULAR | Status: DC | PRN
  Administered 2021-01-25 – 2021-01-26 (×3): 4 mg via INTRAVENOUS
  Filled 2021-01-24 (×3): qty 2

## 2021-01-24 MED ORDER — SODIUM CHLORIDE 0.9% FLUSH
10.0000 mL | Freq: Two times a day (BID) | INTRAVENOUS | Status: DC
  Administered 2021-01-24: 20 mL
  Administered 2021-01-25 – 2021-01-30 (×6): 10 mL

## 2021-01-24 MED ORDER — PHENYLEPHRINE HCL-NACL 20-0.9 MG/250ML-% IV SOLN: 0.0000 ug/min | INTRAVENOUS | Status: DC

## 2021-01-24 MED ORDER — FENTANYL CITRATE (PF) 250 MCG/5ML IJ SOLN
INTRAMUSCULAR | Status: DC | PRN
  Administered 2021-01-24 (×2): 50 ug via INTRAVENOUS
  Administered 2021-01-24 (×2): 100 ug via INTRAVENOUS
  Administered 2021-01-24: 50 ug via INTRAVENOUS
  Administered 2021-01-24: 100 ug via INTRAVENOUS
  Administered 2021-01-24 (×2): 50 ug via INTRAVENOUS

## 2021-01-24 MED ORDER — TRAMADOL HCL 50 MG PO TABS
50.0000 mg | ORAL_TABLET | ORAL | Status: DC | PRN
Start: 2021-01-24 — End: 2021-01-30
  Administered 2021-01-25 – 2021-01-27 (×6): 100 mg via ORAL
  Filled 2021-01-24 (×7): qty 2

## 2021-01-24 MED ORDER — PHENYLEPHRINE HCL (PRESSORS) 10 MG/ML IV SOLN
INTRAVENOUS | Status: DC | PRN
  Administered 2021-01-24: 80 ug via INTRAVENOUS
  Administered 2021-01-24: 40 ug via INTRAVENOUS
  Administered 2021-01-24: 80 ug via INTRAVENOUS
  Administered 2021-01-24 (×2): 40 ug via INTRAVENOUS
  Administered 2021-01-24: 120 ug via INTRAVENOUS
  Administered 2021-01-24: 80 ug via INTRAVENOUS

## 2021-01-24 MED ORDER — LACTATED RINGERS IV SOLN: INTRAVENOUS | Status: DC | PRN

## 2021-01-24 MED ORDER — SODIUM CHLORIDE 0.9% FLUSH: 3.0000 mL | INTRAVENOUS | Status: DC | PRN

## 2021-01-24 MED ORDER — ACETAMINOPHEN 650 MG RE SUPP
650.0000 mg | Freq: Once | RECTAL | Status: AC
  Administered 2021-01-24: 650 mg via RECTAL

## 2021-01-24 MED ORDER — DOCUSATE SODIUM 100 MG PO CAPS
200.0000 mg | ORAL_CAPSULE | Freq: Every day | ORAL | Status: DC
  Administered 2021-01-25 – 2021-01-30 (×5): 200 mg via ORAL
  Filled 2021-01-24 (×6): qty 2

## 2021-01-24 MED ORDER — ALBUMIN HUMAN 5 % IV SOLN
250.0000 mL | INTRAVENOUS | Status: AC | PRN
  Administered 2021-01-24 – 2021-01-25 (×4): 12.5 g via INTRAVENOUS
  Filled 2021-01-24 (×2): qty 250

## 2021-01-24 MED ORDER — MIDAZOLAM HCL 2 MG/2ML IJ SOLN: 2.0000 mg | INTRAMUSCULAR | Status: DC | PRN

## 2021-01-24 MED ORDER — ACETAMINOPHEN 160 MG/5ML PO SOLN: 1000.0000 mg | Freq: Four times a day (QID) | ORAL | Status: DC

## 2021-01-24 MED ORDER — LACTATED RINGERS IV SOLN: INTRAVENOUS | Status: DC

## 2021-01-24 MED ORDER — INSULIN ASPART 100 UNIT/ML ~~LOC~~ SOLN
0.0000 [IU] | SUBCUTANEOUS | Status: DC
  Administered 2021-01-24 – 2021-01-25 (×3): 2 [IU] via SUBCUTANEOUS

## 2021-01-24 MED ORDER — SODIUM CHLORIDE 0.9 % IV SOLN: INTRAVENOUS | Status: DC | PRN

## 2021-01-24 MED ORDER — SODIUM CHLORIDE 0.9% FLUSH
10.0000 mL | INTRAVENOUS | Status: DC | PRN
Start: 2021-01-24 — End: 2021-01-30

## 2021-01-24 MED ORDER — METOPROLOL TARTRATE 5 MG/5ML IV SOLN: 2.5000 mg | INTRAVENOUS | Status: DC | PRN

## 2021-01-24 MED ORDER — SODIUM CHLORIDE 0.9 % IV SOLN: 250.0000 mL | INTRAVENOUS | Status: DC

## 2021-01-24 MED ORDER — ROCURONIUM BROMIDE 10 MG/ML (PF) SYRINGE
PREFILLED_SYRINGE | INTRAVENOUS | Status: AC
  Filled 2021-01-24: qty 10

## 2021-01-24 MED ORDER — 0.9 % SODIUM CHLORIDE (POUR BTL) OPTIME
TOPICAL | Status: DC | PRN
  Administered 2021-01-24: 3000 mL

## 2021-01-24 MED ORDER — METOPROLOL TARTRATE 12.5 MG HALF TABLET
12.5000 mg | ORAL_TABLET | Freq: Once | ORAL | Status: AC
  Administered 2021-01-24: 12.5 mg via ORAL
  Filled 2021-01-24: qty 1

## 2021-01-24 MED ORDER — MIDAZOLAM HCL (PF) 10 MG/2ML IJ SOLN
INTRAMUSCULAR | Status: AC
  Filled 2021-01-24: qty 2

## 2021-01-24 MED ORDER — BISACODYL 5 MG PO TBEC
10.0000 mg | DELAYED_RELEASE_TABLET | Freq: Every day | ORAL | Status: DC
  Administered 2021-01-25 – 2021-01-30 (×5): 10 mg via ORAL
  Filled 2021-01-24 (×6): qty 2

## 2021-01-24 MED ORDER — POTASSIUM CHLORIDE 10 MEQ/50ML IV SOLN: 10.0000 meq | INTRAVENOUS | Status: AC

## 2021-01-24 MED ORDER — OXYCODONE HCL 5 MG PO TABS: 5.0000 mg | ORAL_TABLET | ORAL | Status: DC | PRN

## 2021-01-24 MED ORDER — ROCURONIUM BROMIDE 100 MG/10ML IV SOLN
INTRAVENOUS | Status: DC | PRN
  Administered 2021-01-24: 70 mg via INTRAVENOUS
  Administered 2021-01-24: 30 mg via INTRAVENOUS
  Administered 2021-01-24: 50 mg via INTRAVENOUS
  Administered 2021-01-24: 30 mg via INTRAVENOUS

## 2021-01-24 MED ORDER — ACETAMINOPHEN 500 MG PO TABS
1000.0000 mg | ORAL_TABLET | Freq: Four times a day (QID) | ORAL | Status: AC
  Administered 2021-01-24 – 2021-01-29 (×17): 1000 mg via ORAL
  Filled 2021-01-24 (×17): qty 2

## 2021-01-24 MED ORDER — GLYCOPYRROLATE 0.2 MG/ML IJ SOLN
INTRAMUSCULAR | Status: DC | PRN
  Administered 2021-01-24 (×2): .1 mg via INTRAVENOUS

## 2021-01-24 MED ORDER — SODIUM CHLORIDE 0.9 % IV SOLN: INTRAVENOUS | Status: AC

## 2021-01-24 MED ORDER — NITROGLYCERIN IN D5W 200-5 MCG/ML-% IV SOLN: 0.0000 ug/min | INTRAVENOUS | Status: DC

## 2021-01-24 MED ORDER — SUGAMMADEX SODIUM 200 MG/2ML IV SOLN
INTRAVENOUS | Status: DC | PRN
  Administered 2021-01-24: 200 mg via INTRAVENOUS

## 2021-01-24 MED ORDER — CHLORHEXIDINE GLUCONATE 4 % EX LIQD: 30.0000 mL | CUTANEOUS | Status: DC

## 2021-01-24 MED ORDER — SODIUM CHLORIDE 0.45 % IV SOLN: INTRAVENOUS | Status: DC | PRN

## 2021-01-24 MED ORDER — PROPOFOL 10 MG/ML IV BOLUS
INTRAVENOUS | Status: DC | PRN
  Administered 2021-01-24: 140 mg via INTRAVENOUS

## 2021-01-24 MED ORDER — ASPIRIN EC 325 MG PO TBEC
325.0000 mg | DELAYED_RELEASE_TABLET | Freq: Every day | ORAL | Status: AC
  Administered 2021-01-25: 325 mg via ORAL
  Filled 2021-01-24: qty 1

## 2021-01-24 MED ORDER — BISACODYL 10 MG RE SUPP: 10.0000 mg | Freq: Every day | RECTAL | Status: DC

## 2021-01-24 MED ORDER — ASPIRIN 81 MG PO CHEW: 324.0000 mg | CHEWABLE_TABLET | Freq: Every day | ORAL | Status: DC

## 2021-01-24 MED ORDER — SODIUM CHLORIDE 0.9 % IV SOLN
1.5000 g | Freq: Two times a day (BID) | INTRAVENOUS | Status: AC
  Administered 2021-01-24 – 2021-01-26 (×4): 1.5 g via INTRAVENOUS
  Filled 2021-01-24 (×5): qty 1.5

## 2021-01-24 SURGICAL SUPPLY — 117 items
ADAPTER CARDIO PERF ANTE/RETRO (ADAPTER) ×3 IMPLANT
ADH SKN CLS APL DERMABOND .7 (GAUZE/BANDAGES/DRESSINGS) ×4
ADH SKN CLS LQ APL DERMABOND (GAUZE/BANDAGES/DRESSINGS) ×4
ADPR PRFSN 84XANTGRD RTRGD (ADAPTER) ×2
BAG DECANTER FOR FLEXI CONT (MISCELLANEOUS) ×6 IMPLANT
BLADE CLIPPER SURG (BLADE) ×3 IMPLANT
BLADE SURG 11 STRL SS (BLADE) ×3 IMPLANT
CANISTER SUCT 3000ML PPV (MISCELLANEOUS) ×6 IMPLANT
CANNULA ADULT BIO-MEDICUS 15FR (CANNULA) ×1 IMPLANT
CANNULA FEM VENOUS REMOTE 22FR (CANNULA) ×1 IMPLANT
CANNULA FEMORAL ART 14 SM (MISCELLANEOUS) ×3 IMPLANT
CANNULA GUNDRY RCSP 15FR (MISCELLANEOUS) ×3 IMPLANT
CANNULA OPTISITE PERFUSION 16F (CANNULA) IMPLANT
CANNULA OPTISITE PERFUSION 18F (CANNULA) ×1 IMPLANT
CANNULA SUMP PERICARDIAL (CANNULA) ×6 IMPLANT
CATH CPB KIT OWEN (MISCELLANEOUS) IMPLANT
CATH KIT ON-Q SILVERSOAK 5 (CATHETERS) IMPLANT
CATH KIT ON-Q SILVERSOAK 5IN (CATHETERS) IMPLANT
CELLS DAT CNTRL 66122 CELL SVR (MISCELLANEOUS) ×2 IMPLANT
CLOSURE PERCLOSE PROSTYLE (VASCULAR PRODUCTS) ×5 IMPLANT
CNTNR URN SCR LID CUP LEK RST (MISCELLANEOUS) ×2 IMPLANT
CONN 1/4X1/4 STERILE (MISCELLANEOUS) ×1 IMPLANT
CONN 3/8X1/2 ST GISH (MISCELLANEOUS) ×1 IMPLANT
CONN 3/8X3/8 GISH STERILE (MISCELLANEOUS) ×2 IMPLANT
CONN ST 1/4X3/8  BEN (MISCELLANEOUS) ×6
CONN ST 1/4X3/8 BEN (MISCELLANEOUS) ×4 IMPLANT
CONNECTOR 1/2X3/8X1/2 3 WAY (MISCELLANEOUS) ×3
CONNECTOR 1/2X3/8X1/2 3WAY (MISCELLANEOUS) ×2 IMPLANT
CONT SPEC 4OZ STRL OR WHT (MISCELLANEOUS) ×6
COVER BACK TABLE 24X17X13 BIG (DRAPES) ×3 IMPLANT
COVER PROBE W GEL 5X96 (DRAPES) ×3 IMPLANT
DERMABOND ADHESIVE PROPEN (GAUZE/BANDAGES/DRESSINGS) ×2
DERMABOND ADVANCED (GAUZE/BANDAGES/DRESSINGS) ×2
DERMABOND ADVANCED .7 DNX12 (GAUZE/BANDAGES/DRESSINGS) ×4 IMPLANT
DERMABOND ADVANCED .7 DNX6 (GAUZE/BANDAGES/DRESSINGS) IMPLANT
DEVICE CLOSURE PERCLS PRGLD 6F (VASCULAR PRODUCTS) ×8 IMPLANT
DEVICE SUT CK QUICK LOAD INDV (Prosthesis & Implant Heart) ×1 IMPLANT
DEVICE SUT CK QUICK LOAD MINI (Prosthesis & Implant Heart) ×2 IMPLANT
DEVICE TROCAR PUNCTURE CLOSURE (ENDOMECHANICALS) ×3 IMPLANT
DRAIN CHANNEL 32F RND 10.7 FF (WOUND CARE) ×6 IMPLANT
DRAPE C-ARM 42X72 X-RAY (DRAPES) ×3 IMPLANT
DRAPE CV SPLIT W-CLR ANES SCRN (DRAPES) ×3 IMPLANT
DRAPE INCISE IOBAN 66X45 STRL (DRAPES) ×9 IMPLANT
DRAPE PERI GROIN 82X75IN TIB (DRAPES) ×3 IMPLANT
DRAPE SLUSH/WARMER DISC (DRAPES) ×3 IMPLANT
DRSG AQUACEL AG ADV 3.5X 6 (GAUZE/BANDAGES/DRESSINGS) ×1 IMPLANT
DRSG AQUACEL AG ADV 3.5X10 (GAUZE/BANDAGES/DRESSINGS) ×3 IMPLANT
ELECT BLADE 6.5 EXT (BLADE) ×3 IMPLANT
ELECT REM PT RETURN 9FT ADLT (ELECTROSURGICAL) ×6
ELECTRODE REM PT RTRN 9FT ADLT (ELECTROSURGICAL) ×4 IMPLANT
FELT TEFLON 1X6 (MISCELLANEOUS) ×3 IMPLANT
FEMORAL VENOUS CANN RAP (CANNULA) IMPLANT
GAUZE SPONGE 4X4 12PLY STRL (GAUZE/BANDAGES/DRESSINGS) ×1 IMPLANT
GAUZE SPONGE 4X4 12PLY STRL LF (GAUZE/BANDAGES/DRESSINGS) ×3 IMPLANT
GLOVE ORTHO TXT STRL SZ7.5 (GLOVE) ×9 IMPLANT
GLOVE SURG PR MICRO ENCORE 7 (GLOVE) ×1 IMPLANT
GLOVE SURG UNDER POLY LF SZ6.5 (GLOVE) ×2 IMPLANT
GLOVE SURG UNDER POLY LF SZ7.5 (GLOVE) ×1 IMPLANT
GOWN STRL REUS W/ TWL LRG LVL3 (GOWN DISPOSABLE) ×8 IMPLANT
GOWN STRL REUS W/TWL LRG LVL3 (GOWN DISPOSABLE) ×27
GRASPER SUT TROCAR 14GX15 (MISCELLANEOUS) ×3 IMPLANT
KIT BASIN OR (CUSTOM PROCEDURE TRAY) ×3 IMPLANT
KIT DILATOR VASC 18G NDL (KITS) ×3 IMPLANT
KIT DRAINAGE VACCUM ASSIST (KITS) ×1 IMPLANT
KIT MICROPUNCTURE NIT STIFF (SHEATH) ×1 IMPLANT
KIT SUCTION CATH 14FR (SUCTIONS) ×3 IMPLANT
KIT SUT CK MINI COMBO 4X17 (Prosthesis & Implant Heart) ×1 IMPLANT
KIT TURNOVER KIT B (KITS) ×3 IMPLANT
LEAD PACING MYOCARDI (MISCELLANEOUS) ×3 IMPLANT
LINE VENT (MISCELLANEOUS) ×1 IMPLANT
NDL AORTIC ROOT 14G 7F (CATHETERS) ×2 IMPLANT
NEEDLE AORTIC ROOT 14G 7F (CATHETERS) ×3 IMPLANT
NS IRRIG 1000ML POUR BTL (IV SOLUTION) ×13 IMPLANT
PACK E MIN INVASIVE VALVE (SUTURE) ×3 IMPLANT
PACK OPEN HEART (CUSTOM PROCEDURE TRAY) ×3 IMPLANT
PAD ARMBOARD 7.5X6 YLW CONV (MISCELLANEOUS) ×6 IMPLANT
PAD ELECT DEFIB RADIOL ZOLL (MISCELLANEOUS) ×3 IMPLANT
PERCLOSE PROGLIDE 6F (VASCULAR PRODUCTS)
POSITIONER HEAD DONUT 9IN (MISCELLANEOUS) ×3 IMPLANT
RETRACTOR WND ALEXIS 18 MED (MISCELLANEOUS) ×2 IMPLANT
RING MITRAL MEMO 4D 32 (Prosthesis & Implant Heart) ×1 IMPLANT
RTRCTR WOUND ALEXIS 18CM MED (MISCELLANEOUS) ×3
SET CANNULATION TOURNIQUET (MISCELLANEOUS) ×3 IMPLANT
SET CARDIOPLEGIA MPS 5001102 (MISCELLANEOUS) ×1 IMPLANT
SET IRRIG TUBING LAPAROSCOPIC (IRRIGATION / IRRIGATOR) ×3 IMPLANT
SET MICROPUNCTURE 5F STIFF (MISCELLANEOUS) ×3 IMPLANT
SHEATH PINNACLE 8F 10CM (SHEATH) ×13 IMPLANT
SIZER CHORD-X CHORDAL CXCS (SIZER) ×1 IMPLANT
SOL ANTI FOG 6CC (MISCELLANEOUS) ×2 IMPLANT
SOLUTION ANTI FOG 6CC (MISCELLANEOUS) ×1
SUT BONE WAX W31G (SUTURE) ×3 IMPLANT
SUT EB EXC GRN/WHT 2-0 D/A SH (SUTURE) ×3
SUT ETHIBOND (SUTURE) ×2 IMPLANT
SUT ETHIBOND 2-0 RB-1 WHT (SUTURE) ×3 IMPLANT
SUT ETHIBOND X763 2 0 SH 1 (SUTURE) ×3 IMPLANT
SUT GORETEX CV 4 TH 22 36 (SUTURE) IMPLANT
SUT GORETEX CV4 TH-18 (SUTURE) IMPLANT
SUT PROLENE 3 0 SH1 36 (SUTURE) ×12 IMPLANT
SUT PROLENE 4 0 RB 1 (SUTURE) ×18
SUT PROLENE 4-0 RB1 .5 CRCL 36 (SUTURE) IMPLANT
SUTURE EB EXC GRN/WHT 2-0 D/A (SUTURE) IMPLANT
SYSTEM SAHARA CHEST DRAIN ATS (WOUND CARE) ×3 IMPLANT
TAPE CLOTH SURG 4X10 WHT LF (GAUZE/BANDAGES/DRESSINGS) ×1 IMPLANT
TAPE PAPER 2X10 WHT MICROPORE (GAUZE/BANDAGES/DRESSINGS) ×1 IMPLANT
TOWEL GREEN STERILE (TOWEL DISPOSABLE) ×3 IMPLANT
TOWEL GREEN STERILE FF (TOWEL DISPOSABLE) ×3 IMPLANT
TRAY FOLEY SLVR 16FR TEMP STAT (SET/KITS/TRAYS/PACK) ×3 IMPLANT
TROCAR XCEL BLADELESS 5X75MML (TROCAR) ×3 IMPLANT
TROCAR XCEL NON-BLD 11X100MML (ENDOMECHANICALS) ×6 IMPLANT
TUBE 1/2X3/32 6FT (MISCELLANEOUS) ×1 IMPLANT
TUBE SUCT INTRACARD DLP 20F (MISCELLANEOUS) ×3 IMPLANT
TUBING ART PRESS 48 MALE/FEM (TUBING) ×1 IMPLANT
TUNNELER SHEATH ON-Q 11GX8 DSP (PAIN MANAGEMENT) IMPLANT
UNDERPAD 30X36 HEAVY ABSORB (UNDERPADS AND DIAPERS) ×3 IMPLANT
WATER STERILE IRR 1000ML POUR (IV SOLUTION) ×6 IMPLANT
WIRE EMERALD 3MM-J .035X150CM (WIRE) ×3 IMPLANT
WIRE HI TORQ VERSACORE-J 145CM (WIRE) ×1 IMPLANT

## 2021-01-24 NOTE — Anesthesia Procedure Notes (Addendum)
Central Venous Catheter Insertion Performed by: Darral Dash, DO, anesthesiologist Start/End3/22/2022 7:05 AM, 01/24/2021 7:06 AM Patient location: Pre-op. Preanesthetic checklist: patient identified, IV checked, site marked, risks and benefits discussed, monitors and equipment checked, pre-op evaluation, timeout performed and anesthesia consent Position: supine Hand hygiene performed  and maximum sterile barriers used  PA cath was placed.Swan type:thermodilution PA Cath depth:50 Procedure performed without using ultrasound guided technique. Attempts: 1 Patient tolerated the procedure well with no immediate complications.

## 2021-01-24 NOTE — Anesthesia Procedure Notes (Signed)
Arterial Line Insertion Start/End3/22/2022 6:55 AM, 01/24/2021 7:00 AM Performed by: CRNA  Patient location: Pre-op. Preanesthetic checklist: patient identified, IV checked, site marked, risks and benefits discussed, surgical consent, monitors and equipment checked, pre-op evaluation, timeout performed and anesthesia consent Lidocaine 1% used for infiltration radial was placed Catheter size: 20 G Hand hygiene performed , maximum sterile barriers used  and Seldinger technique used Allen's test indicative of satisfactory collateral circulation Attempts: 1 Procedure performed without using ultrasound guided technique. Following insertion, dressing applied and Biopatch. Post procedure assessment: unchanged  Patient tolerated the procedure well with no immediate complications.

## 2021-01-24 NOTE — Anesthesia Procedure Notes (Signed)
Procedure Name: Intubation Performed by: Terrence Dupont, RN Pre-anesthesia Checklist: Patient identified, Emergency Drugs available, Suction available and Patient being monitored Patient Re-evaluated:Patient Re-evaluated prior to induction Oxygen Delivery Method: Circle system utilized Preoxygenation: Pre-oxygenation with 100% oxygen Induction Type: IV induction Ventilation: Mask ventilation without difficulty Laryngoscope Size: Mac and 4 Grade View: Grade I Tube type: Oral Endobronchial tube: Double lumen EBT, Bronchial Blocker placed under direct vision and EBT position confirmed by auscultation and 39 Fr Number of attempts: 1 Airway Equipment and Method: Stylet and Video-laryngoscopy Placement Confirmation: ETT inserted through vocal cords under direct vision,  positive ETCO2 and breath sounds checked- equal and bilateral Secured at: 31 cm Tube secured with: Tape Dental Injury: Teeth and Oropharynx as per pre-operative assessment

## 2021-01-24 NOTE — Progress Notes (Signed)
  Echocardiogram Echocardiogram Transesophageal has been performed.  Thomas [name] M 01/24/2021, 8:57 AM

## 2021-01-24 NOTE — Brief Op Note (Signed)
01/24/2021  12:12 PM  PATIENT:  Thomas [name]  75 y.o. male  PRE-OPERATIVE DIAGNOSIS:  Mitral regurgitation  POST-OPERATIVE DIAGNOSIS:  Mitral regurgitation, Redundant P2 Segment  PROCEDURE:    MINIMALLY INVASIVE MITRAL VALVE REPAIR WITH QUADRANGULAR RESECTION OF P2 AND ANNULOPLASTY USING 4D MEMO RING SIZE 32MM   TRANSESOPHAGEAL ECHOCARDIOGRAM   SURGEON:  Rexene Alberts, MD - Primary  PHYSICIAN ASSISTANT: Laurelai Lepp   ANESTHESIA:   general  EBL:  Per anesthesia and perfusion records   BLOOD ADMINISTERED:none  DRAINS: Mediastinal and right pleural drains  LOCAL MEDICATIONS USED:  Exparel local  SPECIMEN:  Source of Specimen:  Portion of mitral valve leaflet  DISPOSITION OF SPECIMEN:  PATHOLOGY  COUNTS:  YES  DICTATION: .Dragon Dictation  PLAN OF CARE: Admit to inpatient   PATIENT DISPOSITION:  ICU - intubated and hemodynamically stable.   Delay start of Pharmacological VTE agent (>24hrs) due to surgical blood loss or risk of bleeding: yes

## 2021-01-24 NOTE — Progress Notes (Signed)
EVENING ROUNDS NOTE :     [address]       [place] [address]             [phone]                 Day of Surgery Procedure(s) (LRB): MINIMALLY INVASIVE MITRAL VALVE REPAIR (MVR) USING 4D MEMO RING SIZE 32MM (Right) TRANSESOPHAGEAL ECHOCARDIOGRAM (TEE) (N/A)   Total Length of Stay:  LOS: 0 days  Events:   Still very sleepy Good sats Good hemodynamics Will consider bipap gas stays the same   BP (!) 102/59   Pulse 80   Temp 97.7 F (36.5 C)   Resp 16   Ht 5\' 10"  (1.778 m)   Wt 79.5 kg   SpO2 98%   BMI 25.15 kg/m   PAP: (32-47)/(9-26) 32/9 CO:  [5.3 L/min] 5.3 L/min CI:  [2.7 L/min/m2] 2.7 L/min/m2     . sodium chloride Stopped (01/24/21 1757)  . sodium chloride 100 mL/hr at 01/24/21 1800  . [START ON 01/25/2021] sodium chloride    . sodium chloride    . albumin human 12.5 g (01/24/21 1750)  . cefUROXime (ZINACEF)  IV 200 mL/hr at 01/24/21 1800  . dexmedetomidine (PRECEDEX) IV infusion    . famotidine (PEPCID) IV Stopped (01/24/21 1531)  . insulin 0.7 mL/hr at 01/24/21 1800  . lactated ringers    . lactated ringers    . lactated ringers    . magnesium sulfate 20 mL/hr at 01/24/21 1800  . nitroGLYCERIN    . phenylephrine (NEO-SYNEPHRINE) Adult infusion 10 mcg/min (01/24/21 1800)  . vancomycin      No intake/output data recorded.   CBC Latest Ref Rng & Units 01/24/2021 01/24/2021 01/24/2021  WBC 4.0 - 10.5 K/uL - - -  Hemoglobin 13.0 - 17.0 g/dL 12.2(L) 11.9(L) 11.6(L)  Hematocrit 39.0 - 52.0 % 36.0(L) 35.0(L) 34.0(L)  Platelets 150 - 400 K/uL - - -    BMP Latest Ref Rng & Units 01/24/2021 01/24/2021 01/24/2021  Glucose 70 - 99 mg/dL - - -  BUN 8 - 23 mg/dL - - -  Creatinine 0.61 - 1.24 mg/dL - - -  Sodium 135 - 145 mmol/L 139 140 141  Potassium 3.5 - 5.1 mmol/L 4.2 4.2 4.4  Chloride 98 - 111 mmol/L - - -  CO2 22 - 32 mmol/L - - -  Calcium 8.9 - 10.3 mg/dL - - -    ABG    Component Value Date/Time   PHART 7.259 (L) 01/24/2021  1705   PCO2ART 51.3 (H) 01/24/2021 1705   PO2ART 106 01/24/2021 1705   HCO3 23.2 01/24/2021 1705   TCO2 25 01/24/2021 1705   ACIDBASEDEF 5.0 (H) 01/24/2021 1705   O2SAT 97.0 01/24/2021 1705       Melodie Bouillon, MD 01/24/2021 6:41 PM

## 2021-01-24 NOTE — Anesthesia Procedure Notes (Signed)
Central Venous Catheter Insertion Performed by: Darral Dash, DO, anesthesiologist Start/End3/22/2022 6:55 AM, 01/24/2021 7:05 AM Patient location: Pre-op. Preanesthetic checklist: patient identified, IV checked, site marked, risks and benefits discussed, surgical consent, monitors and equipment checked, pre-op evaluation, timeout performed and anesthesia consent Position: Trendelenburg Lidocaine 1% used for infiltration and patient sedated Hand hygiene performed  and maximum sterile barriers used  Catheter size: 9 Fr Central line was placed.MAC introducer Procedure performed using ultrasound guided technique. Ultrasound Notes:anatomy identified, needle tip was noted to be adjacent to the nerve/plexus identified, no ultrasound evidence of intravascular and/or intraneural injection and image(s) printed for medical record Attempts: 1 Following insertion, line sutured, dressing applied and Biopatch. Post procedure assessment: blood return through all ports, free fluid flow and no air  Patient tolerated the procedure well with no immediate complications.

## 2021-01-24 NOTE — Interval H&P Note (Signed)
History and Physical Interval Note:  01/24/2021 5:27 AM  Thomas [name]  has presented today for surgery, with the diagnosis of MR.  The various methods of treatment have been discussed with the patient and family. After consideration of risks, benefits and other options for treatment, the patient has consented to  [name] as a surgical intervention.  The patient's history has been reviewed, patient examined, no change in status, stable for surgery.  I have reviewed the patient's chart and labs.  Questions were answered to the patient's satisfaction.     Rexene Alberts

## 2021-01-24 NOTE — Transfer of Care (Signed)
Immediate Anesthesia Transfer of Care Note  Patient: Thomas [name]  Procedure(s) Performed: MINIMALLY INVASIVE MITRAL VALVE REPAIR (MVR) USING 4D MEMO RING SIZE 32MM (Right Chest) TRANSESOPHAGEAL ECHOCARDIOGRAM (TEE) (N/A )  Patient Location: ICU  Anesthesia Type:General  Level of Consciousness: drowsy and responds to stimulation  Airway & Oxygen Therapy: Patient Spontanous Breathing and Patient connected to face mask oxygen  Post-op Assessment: Report given to RN and Post -op Vital signs reviewed and stable  Post vital signs: Reviewed and stable  Last Vitals:  Vitals Value Taken Time  BP 104/77   Temp 35.6 C 01/24/21 1415  Pulse 80 01/24/21 1415  Resp 24 01/24/21 1415  SpO2 100 % 01/24/21 1415  Vitals shown include unvalidated device data.  Last Pain:  Vitals:   01/24/21 0604  PainSc: 0-No pain         Complications: No complications documented.

## 2021-01-24 NOTE — Op Note (Signed)
CARDIOTHORACIC SURGERY OPERATIVE NOTE  Date of Procedure:  01/24/2021  Preoperative Diagnosis: Severe Mitral Regurgitation  Postoperative Diagnosis: Same  Procedure:    Minimally-Invasive Mitral Valve Repair  Complex valvuloplasty including quadrangular resection of P2 segment of posterior leaflet  Sorin Memo 4D Ring Annuloplasty (size 65mm, catalog # [id number], serial # [id number])    Surgeon: Valentina Gu. Roxy Manns, MD  Assistant: Enid Cutter, PA-C  Anesthesia: Rochele Pages, DO  Operative Findings:  Barlow's type myxomatous degenerative disease  Bileaflet prolapse with severe prolapse of P2 segment of posterior leaflet  Ulcerated surface of posterior mitral annulus w/out significant calcification  Multiple elongated primary chordae tendinae  Type II dysfunction with severe mitral regurgitation  Normal left ventricular systolic function  Trace/mild residual mitral regurgitation after successful valve repair                      BRIEF CLINICAL NOTE AND INDICATIONS FOR SURGERY  Patient is a 75 year old male who has been referred for surgical consultation to discuss treatment options for management of mitral valve prolapse with severe mitral regurgitation.  Patient states that he was first noted to have a heart murmur in 1969 when he joined the [employer]. He was diagnosed with mitral valve prolapse. Approximately 10 years ago his primary care physician noted that his heart murmur had become more prominent and an echocardiogram was performed confirming the presence of mitral valve prolapse with moderate mitral regurgitation. The patient has been followed carefully ever since, most recently by Dr. [name]. Follow-up transthoracic echocardiogram performed October 18, 2020 revealed significant progression of disease with bileaflet prolapse and likely severe mitral regurgitation. There remain normal left ventricular size and systolic function with moderate left  atrial enlargement. The patient subsequently underwent transesophageal echocardiogram November 23, 2020 which confirmed the presence of myxomatous degenerative disease with bileaflet prolapse and severe mitral regurgitation. Cardiothoracic surgical consultation was requested.  The patient has been seen in consultation and counseled at length regarding the indications, risks and potential benefits of surgery.  All questions have been answered, and the patient provides full informed consent for the operation as described.    DETAILS OF THE OPERATIVE PROCEDURE  Preparation:  The patient is brought to the operating room on the above mentioned date and central monitoring was established by the anesthesia team including placement of Swan-Ganz catheter through the left internal jugular vein.  A radial arterial line is placed. The patient is placed in the supine position on the operating table.  Intravenous antibiotics are administered. General endotracheal anesthesia is induced uneventfully. The patient is initially intubated using a dual lumen endotracheal tube.  A Foley catheter is placed.  Baseline transesophageal echocardiogram was performed.  Findings were notable for bileaflet prolapse with large billowing leaflets and severe prolapse involving a portion of the middle scallop (P2) of the posterior leaflet.  There were multiple elongated primary chordae tendinae but none were ruptured.  There was severe mitral regurgitation with flow reversal in the pulmonary veins.  There was normal left ventricular size and systolic function.  There was mild central aortic insufficiency.  A soft roll is placed behind the patient's left scapula and the neck gently extended and turned to the left.   The patient's right neck, chest, abdomen, both groins, and both lower extremities are prepared and draped in a sterile manner. A time out procedure is performed.   Percutaneous Vascular Access:  Percutaneous arterial  and venous access were obtained on the right side.  Using ultrasound guidance  the right common femoral vein was cannulated using the Seldinger technique a pair of Perclose vascular closure devises were placed at opposing 30 degree angles in the femoral vein, after which time an 8 French sheath inserted.  The right common femoral artery was cannulated using a micropuncture wire and sheath.  A pair of Perclose vascular closure devices were placed at opposing 30 degree angles in the femoral artery, and a 8 French sheath inserted.  The right internal jugular vein was cannulated  using ultrasound guidance and an 8 French sheath inserted.     Surgical Approach:  A right miniature anterolateral thoracotomy incision is performed. The incision is placed just lateral to and superior to the right nipple. The pectoralis major muscle is retracted medially and completely preserved. The right pleural space is entered through the 3rd intercostal space. A soft tissue retractor is placed.  Two 11 mm ports are placed through separate stab incisions inferiorly. The right pleural space is insufflated continuously with carbon dioxide gas through the posterior port during the remainder of the operation.  A pledgeted sutures placed through the dome of the right hemidiaphragm and retracted inferiorly to facilitate exposure.  A longitudinal incision is made in the pericardium 3 cm anterior to the phrenic nerve and silk traction sutures are placed on either side of the incision for exposure.   Extracorporeal Cardiopulmonary Bypass and Myocardial Protection:   The patient was heparinized systemically.  The right common femoral vein is cannulated through the venous sheath and a guidewire advanced into the right atrium using TEE guidance.  The femoral vein cannulated using a 22 Fr long femoral venous cannula.  The right common femoral artery is cannulated through the arterial sheath and a guidewire advanced into the descending thoracic  aorta using TEE guidance.  Femoral artery is cannulated with a 18 French femoral arterial cannula.  The right internal jugular vein is cannulated through the venous sheath and a guidewire advanced into the right atrium.  The internal jugular vein is cannulated using a 15 [place] pediatric femoral venous cannula.   Adequate heparinization is verified.   The entire pre-bypass portion of the operation was notable for stable hemodynamics.  Cardiopulmonary bypass was begun.  Vacuum assist venous drainage is utilized. The incision in the pericardium is extended in both directions. Venous drainage and exposure are notably excellent. A retrograde cardioplegia cannula is placed through the right atrium into the coronary sinus using transesophageal echocardiogram guidance.  An antegrade cardioplegia cannula is placed in the ascending aorta.    The patient is cooled to 32C systemic temperature.  The aortic cross clamp is applied and cardioplegia is delivered initially in an antegrade fashion through the aortic root using modified [place] cold blood cardioplegia ([place] blood cardioplegia protocol).   The initial cardioplegic arrest is rapid with early diastolic arrest.  Myocardial protection was felt to be excellent.   Mitral Valve Repair:  A left atriotomy incision was performed through the interatrial groove and extended partially across the back wall of the left atrium after opening the oblique sinus inferiorly.  The mitral valve is exposed using a self-retaining retractor.  The mitral valve was inspected and notable for Mobitz type myxomatous degenerative disease with large billowing leaflets with significant thickening and redundant leaflet tissue.  There was severe prolapse involving a portion of the P2 segment of the posterior leaflet towards the anterior commissure.  This segment had severely elongated primary chordae tendinae but there were no ruptured cords.  There was also  an area of ulceration at  the base of the segment of the leaflet along the atrial surface of the mitral annulus with exposed myxomatous debris.  There was not significant calcification apparent.  Interrupted 2-0 Ethibond horizontal mattress sutures are placed circumferentially around the entire mitral valve annulus. The sutures will ultimately be utilized for ring annuloplasty, and at this juncture there are utilized to suspend the valve symmetrically.  The severely prolapsing portion of P2 was repaired using a simple quadrangular resection.  After the resection was performed a generous figure-of-eight compression suture was placed at the apex of the posterior annulus.  The remaining vertical defect in the posterior leaflet was closed using interrupted everting 4-0 Prolene suture.  The valve was tested with saline and appeared competent even without ring annuloplasty complete. The valve was sized to a 32 mm annuloplasty ring, based upon the transverse distance between the left and right commissures and the height of the anterior leaflet, corresponding to a size just slightly larger than the overall surface area of the anterior leaflet.  A Sorin Memo 4D annuloplasty ring (size 60mm, catalog #[id number], serial [id number]) was secured in place uneventfully. All ring sutures were secured using a Cor-knot device.    The valve was tested with saline and appeared competent. There is no residual leak. There was a broad, symmetrical line of coaptation of the anterior and posterior leaflet which was confirmed using the blue ink test.  However, on the posterior side of P2 the line of coaptation was notably asymptomatic and too far anterior, potentially creating a set up for systolic anterior motion.  This was corrected using a horizontal mattress 2-0 Ethibond suture to plicate this portion of the P2 segment of the posterior leaflet towards the posterior annulus.  Saline testing is repeated and the valve remains competent and the line of coaptation is  notably much more uniform and appropriately located closer to the posterior annulus.  Rewarming is begun.   Procedure Completion:  The atriotomy was closed using a 2-layer closure of running 3-0 Prolene suture after placing a sump drain across the mitral valve to serve as a left ventricular vent.  One final dose of warm retrograde "reanimation dose" cardioplegia was administered retrograde through the coronary sinus catheter while all air was evacuated through the aortic root.  The aortic cross clamp was removed after a total cross clamp time of 109 minutes.  Epicardial pacing wires are fixed to the inferior wall of the right ventricule and to the right atrial appendage. The patient is rewarmed to 37C temperature. The left ventricular vent and antegrade cardioplegia cannula are removed.  The pericardial sac was drained using a 32 French Bard drain placed through the anterior port incision. The patient is weaned and disconnected from cardiopulmonary bypass.  The patient's rhythm at separation from bypass was AV paced.  The patient was weaned from bypass without any inotropic support. Total cardiopulmonary bypass time for the operation was 153 minutes.  Followup transesophageal echocardiogram performed after separation from bypass revealed a well-seated annuloplasty ring in the mitral position with a normal functioning mitral valve. There was trace/mild (1+) residual leak.  Left ventricular function was unchanged from preoperatively.  The mean gradient across the mitral valve was estimated to be 3 mmHg.  The femoral arterial and venous cannulas were removed and all Perclose sutures secured.  Manual pressure was maintained while Protamine was administered.  The right internal jugular cannula was removed and manual pressure held on the neck and groin for  15 minutes.  Single lung ventilation was begun. The atriotomy closure was inspected for hemostasis.  The right pleural space is irrigated with saline  solution and inspected for hemostasis.   A mixture of Exparel liposomal bupivacaine (20 mL) and 0.5% bupivacaine (30 mL) is utilized to create an intercostal nerve block for postoperative analgesia.  The mixture is injected under direct vision into the intercostal neurovascular bundles posteriorly to cover the second through the sixth intercostal nerve roots.  Portions of the solution are also injected into the intercostal neurovascular bundles immediately surrounding the surgical incision and immediately adjacent to the chest tube exit sites.  The right pleural space was drained using a 32 French Bard drain placed through the posterior port incision. The miniature thoracotomy incision was closed in multiple layers in routine fashion.   The post-bypass portion of the operation was notable for stable rhythm and hemodynamics.  No blood products were administered during the operation.   Disposition:  The patient tolerated the procedure well.  The patient was extubated in the operating room and subsequently transported to the surgical intensive care unit in stable condition. There were no intraoperative complications. All sponge instrument and needle counts are verified correct at completion of the operation.     Valentina Gu. Roxy Manns MD 01/24/2021 1:13 PM

## 2021-01-25 ENCOUNTER — Inpatient Hospital Stay (HOSPITAL_COMMUNITY): Payer: PPO

## 2021-01-25 ENCOUNTER — Encounter (HOSPITAL_COMMUNITY): Payer: Self-pay | Admitting: Thoracic Surgery (Cardiothoracic Vascular Surgery)

## 2021-01-25 LAB — BASIC METABOLIC PANEL
Anion gap: 7 (ref 5–15)
Anion gap: 4 — ABNORMAL LOW (ref 5–15)
BUN: 16 mg/dL (ref 8–23)
BUN: 19 mg/dL (ref 8–23)
CO2: 22 mmol/L (ref 22–32)
CO2: 25 mmol/L (ref 22–32)
Calcium: 7.7 mg/dL — ABNORMAL LOW (ref 8.9–10.3)
Calcium: 8.1 mg/dL — ABNORMAL LOW (ref 8.9–10.3)
Chloride: 105 mmol/L (ref 98–111)
Chloride: 102 mmol/L (ref 98–111)
Creatinine, Ser: 0.86 mg/dL (ref 0.61–1.24)
Creatinine, Ser: 0.77 mg/dL (ref 0.61–1.24)
GFR, Estimated: >60 mL/min (ref >60)
GFR, Estimated: >60 mL/min (ref >60)
Glucose, Bld: 117 mg/dL — ABNORMAL HIGH (ref 70–99)
Glucose, Bld: 142 mg/dL — ABNORMAL HIGH (ref 70–99)
Potassium: 3.9 mmol/L (ref 3.5–5.1)
Potassium: 4.1 mmol/L (ref 3.5–5.1)
Sodium: 134 mmol/L — ABNORMAL LOW (ref 135–145)
Sodium: 131 mmol/L — ABNORMAL LOW (ref 135–145)

## 2021-01-25 LAB — CBC
HCT: 30 % — ABNORMAL LOW (ref 39.0–52.0)
HCT: 29.2 % — ABNORMAL LOW (ref 39.0–52.0)
Hemoglobin: 10.2 g/dL — ABNORMAL LOW (ref 13.0–17.0)
Hemoglobin: 10.3 g/dL — ABNORMAL LOW (ref 13.0–17.0)
MCH: 33.9 pg (ref 26.0–34.0)
MCH: 34.6 pg — ABNORMAL HIGH (ref 26.0–34.0)
MCHC: 34 g/dL (ref 30.0–36.0)
MCHC: 35.3 g/dL (ref 30.0–36.0)
MCV: 99.7 fL (ref 80.0–100.0)
MCV: 98 fL (ref 80.0–100.0)
Platelets: 110 10*3/uL — ABNORMAL LOW (ref 150–400)
Platelets: 105 10*3/uL — ABNORMAL LOW (ref 150–400)
RBC: 3.01 MIL/uL — ABNORMAL LOW (ref 4.22–5.81)
RBC: 2.98 MIL/uL — ABNORMAL LOW (ref 4.22–5.81)
RDW: 13.1 % (ref 11.5–15.5)
RDW: 13.2 % (ref 11.5–15.5)
WBC: 16 10*3/uL — ABNORMAL HIGH (ref 4.0–10.5)
WBC: 18 10*3/uL — ABNORMAL HIGH (ref 4.0–10.5)
nRBC: 0 % (ref 0.0–0.2)
nRBC: 0 % (ref 0.0–0.2)

## 2021-01-25 LAB — SURGICAL PATHOLOGY

## 2021-01-25 LAB — GLUCOSE, CAPILLARY
Glucose-Capillary: 120 mg/dL — ABNORMAL HIGH (ref 70–99)
Glucose-Capillary: 122 mg/dL — ABNORMAL HIGH (ref 70–99)
Glucose-Capillary: 146 mg/dL — ABNORMAL HIGH (ref 70–99)
Glucose-Capillary: 123 mg/dL — ABNORMAL HIGH (ref 70–99)
Glucose-Capillary: 148 mg/dL — ABNORMAL HIGH (ref 70–99)
Glucose-Capillary: 133 mg/dL — ABNORMAL HIGH (ref 70–99)
Glucose-Capillary: 121 mg/dL — ABNORMAL HIGH (ref 70–99)
Glucose-Capillary: 107 mg/dL — ABNORMAL HIGH (ref 70–99)

## 2021-01-25 LAB — MAGNESIUM
Magnesium: 2.1 mg/dL (ref 1.7–2.4)
Magnesium: 2.1 mg/dL (ref 1.7–2.4)

## 2021-01-25 MED ORDER — ASPIRIN EC 81 MG PO TBEC
81.0000 mg | DELAYED_RELEASE_TABLET | Freq: Every day | ORAL | Status: DC
  Administered 2021-01-26 – 2021-01-30 (×5): 81 mg via ORAL
  Filled 2021-01-25 (×5): qty 1

## 2021-01-25 MED ORDER — KETOROLAC TROMETHAMINE 15 MG/ML IJ SOLN
15.0000 mg | Freq: Four times a day (QID) | INTRAMUSCULAR | Status: AC
  Administered 2021-01-25 – 2021-01-26 (×4): 15 mg via INTRAVENOUS
  Filled 2021-01-25 (×4): qty 1

## 2021-01-25 MED ORDER — ENOXAPARIN SODIUM 30 MG/0.3ML ~~LOC~~ SOLN
30.0000 mg | Freq: Every day | SUBCUTANEOUS | Status: DC
  Administered 2021-01-26 – 2021-01-29 (×4): 30 mg via SUBCUTANEOUS
  Filled 2021-01-25 (×4): qty 0.3

## 2021-01-25 MED ORDER — MAGNESIUM OXIDE 400 (241.3 MG) MG PO TABS
200.0000 mg | ORAL_TABLET | Freq: Every day | ORAL | Status: DC
  Administered 2021-01-26 – 2021-01-30 (×5): 200 mg via ORAL
  Filled 2021-01-25 (×5): qty 1

## 2021-01-25 MED ORDER — WARFARIN SODIUM 2.5 MG PO TABS
2.5000 mg | ORAL_TABLET | Freq: Every day | ORAL | Status: DC
  Administered 2021-01-25 – 2021-01-27 (×3): 2.5 mg via ORAL
  Filled 2021-01-25 (×3): qty 1

## 2021-01-25 MED ORDER — WARFARIN - PHYSICIAN DOSING INPATIENT: Freq: Every day | Status: DC

## 2021-01-25 MED ORDER — INSULIN ASPART 100 UNIT/ML ~~LOC~~ SOLN: 0.0000 [IU] | SUBCUTANEOUS | Status: DC

## 2021-01-25 MED ORDER — INSULIN ASPART 100 UNIT/ML ~~LOC~~ SOLN
0.0000 [IU] | SUBCUTANEOUS | Status: DC
  Administered 2021-01-25 (×3): 2 [IU] via SUBCUTANEOUS

## 2021-01-25 MED FILL — Sodium Chloride IV Soln 0.9%: INTRAVENOUS | Qty: 4000 | Status: AC

## 2021-01-25 MED FILL — Electrolyte-R (PH 7.4) Solution: INTRAVENOUS | Qty: 5000 | Status: AC

## 2021-01-25 MED FILL — Lidocaine HCl Local Preservative Free (PF) Inj 2%: INTRAMUSCULAR | Qty: 15 | Status: AC

## 2021-01-25 MED FILL — Potassium Chloride Inj 2 mEq/ML: INTRAVENOUS | Qty: 40 | Status: AC

## 2021-01-25 MED FILL — Heparin Sodium (Porcine) Inj 1000 Unit/ML: INTRAMUSCULAR | Qty: 10 | Status: AC

## 2021-01-25 MED FILL — Heparin Sodium (Porcine) Inj 1000 Unit/ML: INTRAMUSCULAR | Qty: 30 | Status: AC

## 2021-01-25 MED FILL — Sodium Bicarbonate IV Soln 8.4%: INTRAVENOUS | Qty: 50 | Status: AC

## 2021-01-25 NOTE — Anesthesia Postprocedure Evaluation (Signed)
Anesthesia Post Note  Patient: Thomas [name]  Procedure(s) Performed: MINIMALLY INVASIVE MITRAL VALVE REPAIR (MVR) USING 4D MEMO RING SIZE 32MM (Right Chest) TRANSESOPHAGEAL ECHOCARDIOGRAM (TEE) (N/A )     Patient location during evaluation: SICU Anesthesia Type: General Level of consciousness: responds to stimulation and patient cooperative Pain management: pain level controlled Vital Signs Assessment: post-procedure vital signs reviewed and stable Respiratory status: patient connected to face mask oxygen, spontaneous breathing and respiratory function stable Cardiovascular status: stable Postop Assessment: no apparent nausea or vomiting Anesthetic complications: no   No complications documented.  Last Vitals:  Vitals:   01/25/21 0545 01/25/21 0600  BP:  (!) 105/53  Pulse: 80 80  Resp: (!) 25 (!) 22  Temp: 37.4 C 37.2 C  SpO2: 90% 95%    Last Pain:  Vitals:   01/25/21 0542  TempSrc:   PainSc: 6                  Sreya Froio P Alauna Hayden

## 2021-01-25 NOTE — Progress Notes (Signed)
[facility] BRIEF SICU PROGRESS NOTE  1 Day Post-Op  S/P Procedure(s) (LRB): MINIMALLY INVASIVE MITRAL VALVE REPAIR (MVR) USING 4D MEMO RING SIZE 32MM (Right) TRANSESOPHAGEAL ECHOCARDIOGRAM (TEE) (N/A)   Stable day.  Mild nausea.  Denies pain, SOB Maintaining NSR w/ stable BP off Neo Breathing comfortably w/ O2 sats 96% on RA UOP adequate  Plan: Continue current plan  Rexene Alberts, MD 01/25/2021 5:00 PM

## 2021-01-25 NOTE — Hospital Course (Signed)
History of Present Illness: Patient is a 75 year old male who has been referred for surgical consultation to discuss treatment options for management of mitral valve prolapse with severe mitral regurgitation.   Patient states that he was first noted to have a heart murmur in 1969 when he joined the [employer].  He was diagnosed with mitral valve prolapse.  Approximately 10 years ago his primary care physician noted that his heart murmur had become more prominent and an echocardiogram was performed confirming the presence of mitral valve prolapse with moderate mitral regurgitation.  The patient has been followed carefully ever since, most recently by Dr. [name].  Follow-up transthoracic echocardiogram performed October 18, 2020 revealed significant progression of disease with bileaflet prolapse and likely severe mitral regurgitation.  There remain normal left ventricular size and systolic function with moderate left atrial enlargement.  The patient subsequently underwent transesophageal echocardiogram November 23, 2020 which confirmed the presence of myxomatous degenerative disease with bileaflet prolapse and severe mitral regurgitation.  Cardiothoracic surgical consultation was requested.   The patient has been seen in consultation and counseled at length regarding the indications, risks and potential benefits of surgery.  All questions have been answered, and the patient provides full informed consent for the operation as described.   Hospital Course: [name] was admitted to the hospital for elective surgery on 01/24/2021 and taken to the operating room where minimally invasive mitral valve repair was accomplished.  Please see the operative note below for details.  At the conclusion of the procedure, [name] separated from cardiopulmonary bypass without difficulty.  He was extubated in the operating room prior to transfer to the ICU.  He remained hemodynamically stable on low-dose Neo-Synephrine.  He  did not require inotropic support.  Initially, he was mildly hypoxic and hypercarbic and was supported with oxygen therapy.  His respiratory status gradually improved.  On the morning of the first postoperative day, the monitoring lines were removed.  He was mobilized with the assistance of physical therapy.  He was in a stable sinus rhythm.

## 2021-01-25 NOTE — Progress Notes (Addendum)
[facility] DAILY ICU PROGRESS NOTE                   [address]            [place] [address]          [phone]   1 Day Post-Op Procedure(s) (LRB): MINIMALLY INVASIVE MITRAL VALVE REPAIR (MVR) USING 4D MEMO RING SIZE 32MM (Right) TRANSESOPHAGEAL ECHOCARDIOGRAM (TEE) (N/A)  Total Length of Stay:  LOS: 1 day   Subjective: Extubated in OR yesterday. Awake and alert, sitting up eating breakfast,  having some incisional pain intermittently.   Objective: Vital signs in last 24 hours: Temp:  [95.9 F (35.5 C)-99.5 F (37.5 C)] 99 F (37.2 C) (03/23 0600) Pulse Rate:  [79-84] 80 (03/23 0600) Cardiac Rhythm: Normal sinus rhythm;Atrial paced (03/23 0400) Resp:  [14-45] 22 (03/23 0600) BP: (96-125)/(50-80) 105/53 (03/23 0600) SpO2:  [90 %-100 %] 95 % (03/23 0600) Arterial Line BP: (103-135)/(41-75) 114/41 (03/23 0600) Weight:  [79.5 kg-84.7 kg] 84.7 kg (03/23 0600)  Filed Weights   01/24/21 0628 01/24/21 1730 01/25/21 0600  Weight: 79.5 kg 79.5 kg 84.7 kg    Weight change: 0.007 kg   Hemodynamic parameters for last 24 hours: PAP: (26-47)/(6-26) 27/6 CO:  [5.3 L/min-7.4 L/min] 7.4 L/min CI:  [2.7 L/min/m2-3.7 L/min/m2] 3.7 L/min/m2  Intake/Output from previous day: 03/22 0701 - 03/23 0700 In: 5324.6 [I.V.:4049.4; Blood:480; IV Piggyback:795.2] Out: 3080 [Urine:2440; Chest Tube:640]  Intake/Output this shift: No intake/output data recorded.  Current Meds: Scheduled Meds: . acetaminophen  1,000 mg Oral Q6H  . aspirin EC  325 mg Oral Daily  . [START ON 01/26/2021] aspirin EC  81 mg Oral Daily  . bisacodyl  10 mg Oral Daily   Or  . bisacodyl  10 mg Rectal Daily  . Chlorhexidine Gluconate Cloth  6 each Topical Daily  . docusate sodium  200 mg Oral Daily  . [START ON 01/26/2021] enoxaparin (LOVENOX) injection  30 mg Subcutaneous QHS  . insulin aspart  0-24 Units Subcutaneous Q4H  . ketorolac  15 mg Intravenous Q6H  . [START ON 01/26/2021] magnesium oxide   200 mg Oral Daily  . mouth rinse  15 mL Mouth Rinse BID  . [START ON 01/26/2021] pantoprazole  40 mg Oral Daily  . sodium chloride flush  10-40 mL Intracatheter Q12H  . sodium chloride flush  3 mL Intravenous Q12H  . warfarin  2.5 mg Oral q1600  . Warfarin - Physician Dosing Inpatient   Does not apply q1600   Continuous Infusions: . sodium chloride    . cefUROXime (ZINACEF)  IV Stopped (01/25/21 0527)  . lactated ringers    . lactated ringers     PRN Meds:.metoprolol tartrate, morphine injection, ondansetron (ZOFRAN) IV, oxyCODONE, sodium chloride flush, sodium chloride flush, traMADol  General appearance: alert, cooperative and mild distress Neurologic: intact Heart: Paced rhythm most of the night. Pacer now off, NSR ~80/min.  Lungs: clear to auscultation bilaterally Abdomen: Soft, NT.  Extremities: Warm and well perfused, SCD's to LE's.  Wound: Has dry Aquacel dressing to right chest incision. Right femoral cannulation sites are soft and dry.   Lab Results: CBC: Recent Labs    01/24/21 1924 01/25/21 0359  WBC 17.7* 16.0*  HGB 10.9* 10.2*  HCT 32.0* 30.0*  PLT 114* 110*   BMET:  Recent Labs    01/24/21 1924 01/25/21 0359  NA 136 134*  K 3.9 3.9  CL 107 105  CO2 23 22  GLUCOSE 112* 117*  BUN 17 16  CREATININE 0.76 0.86  CALCIUM 7.5* 7.7*    CMET: Lab Results  Component Value Date   WBC 16.0 (H) 01/25/2021   HGB 10.2 (L) 01/25/2021   HCT 30.0 (L) 01/25/2021   PLT 110 (L) 01/25/2021   GLUCOSE 117 (H) 01/25/2021   ALT 23 01/20/2021   AST 24 01/20/2021   NA 134 (L) 01/25/2021   K 3.9 01/25/2021   CL 105 01/25/2021   CREATININE 0.86 01/25/2021   BUN 16 01/25/2021   CO2 22 01/25/2021   INR 1.6 (H) 01/24/2021   HGBA1C 5.7 (H) 01/20/2021      PT/INR:  Recent Labs    01/24/21 1414  LABPROT 18.0*  INR 1.6*   Radiology: [facility] Chest Port 1 View  Result Date: 01/25/2021 CLINICAL DATA:  Chest tube.  Open-heart surgery.  Sore chest. EXAM: PORTABLE CHEST 1  VIEW COMPARISON:  01/24/2021. FINDINGS: Swan-Ganz catheter, right chest tube in stable position. Pacing wires again noted. Prior cardiac valve replacement. Stable cardiomegaly. Mild pulmonary venous congestion. Persistent bibasilar atelectasis/infiltrates. Small left pleural effusion. IMPRESSION: 1. Swan-Ganz catheter, right chest tube in stable position. No pneumothorax. 2. Prior cardiac valve replacement. Stable cardiomegaly. Mild pulmonary venous congestion. Electronically Signed   By: Marcello Moores  Register   On: 01/25/2021 06:39   DG Chest Port 1 View  Result Date: 01/24/2021 CLINICAL DATA:  Status post mitral valve replacement. EXAM: PORTABLE CHEST 1 VIEW COMPARISON:  January 20, 2021 FINDINGS: There is a chest tube on the right without appreciable pneumothorax. Temporary pacemaker wires are attached to the right heart. Swan-Ganz catheter tip is in the main pulmonary outflow tract directed toward the right. There is atelectatic change in each lung base, slightly more on the right than the left. No consolidation. Heart size and pulmonary vascular normal. Patient is status post mitral valve replacement. No bone lesions. IMPRESSION: Tube and catheter positions as described without pneumothorax. Bibasilar atelectasis, slightly more on the right than the left. No consolidation. Status post mitral valve replacement. Electronically Signed   By: Lowella Grip III M.D.   On: 01/24/2021 14:54     Assessment/Plan: S/P Procedure(s) (LRB): MINIMALLY INVASIVE MITRAL VALVE REPAIR (MVR) USING 4D MEMO RING SIZE 32MM (Right) TRANSESOPHAGEAL ECHOCARDIOGRAM (TEE) (N/A)  -POD-1 MV repair for bileaflet MVP and severe MR. Stable hemodynamics. D/C monitoring lines. Mobilize. Begin anticoagulation with Coumadin. Toradol added for pain mgt.   -Expected acute blood loss anemia- Stable, CT output tapering off. Monitor.   -Expected post-op volume excess-- Wt 5kg above pre-op.   -DVT PPX-continue SCD's, mobilize,  to begin  enoxaparin this PM.   -Endo- no h/o DM, glucose well controlled. Continue SSI    Malon Kindle [phone] 01/25/2021 7:37 AM   I have seen and examined the patient and agree with the assessment and plan as outlined.  Doing well POD1.  Maintaining NSR w/ stable hemodynamics on very low dose Neo for BP.  Expected post op acute blood loss anemia, mild.  Expected post op volume excess, weight reportedly 5 kg > preop, UOP adequate.  Mobilize.  D/C lines.  Hold diuretics until BP stable off Neo.   Rexene Alberts, MD 01/25/2021 8:11 AM

## 2021-01-26 ENCOUNTER — Inpatient Hospital Stay (HOSPITAL_COMMUNITY): Payer: PPO

## 2021-01-26 LAB — TYPE AND SCREEN
ABO/RH(D): A POS
Antibody Screen: NEGATIVE
Unit division: 0
Unit division: 0

## 2021-01-26 LAB — BPAM RBC
Blood Product Expiration Date: 202204132359
Blood Product Expiration Date: 202204132359
ISSUE DATE / TIME: 202203220905
ISSUE DATE / TIME: 202203220905
Unit Type and Rh: 6200
Unit Type and Rh: 6200

## 2021-01-26 LAB — BASIC METABOLIC PANEL
Anion gap: 5 (ref 5–15)
BUN: 17 mg/dL (ref 8–23)
CO2: 27 mmol/L (ref 22–32)
Calcium: 8.4 mg/dL — ABNORMAL LOW (ref 8.9–10.3)
Chloride: 102 mmol/L (ref 98–111)
Creatinine, Ser: 0.81 mg/dL (ref 0.61–1.24)
GFR, Estimated: >60 mL/min (ref >60)
Glucose, Bld: 104 mg/dL — ABNORMAL HIGH (ref 70–99)
Potassium: 3.9 mmol/L (ref 3.5–5.1)
Sodium: 134 mmol/L — ABNORMAL LOW (ref 135–145)

## 2021-01-26 LAB — CBC
HCT: 30.3 % — ABNORMAL LOW (ref 39.0–52.0)
Hemoglobin: 10.4 g/dL — ABNORMAL LOW (ref 13.0–17.0)
MCH: 34.2 pg — ABNORMAL HIGH (ref 26.0–34.0)
MCHC: 34.3 g/dL (ref 30.0–36.0)
MCV: 99.7 fL (ref 80.0–100.0)
Platelets: 100 10*3/uL — ABNORMAL LOW (ref 150–400)
RBC: 3.04 MIL/uL — ABNORMAL LOW (ref 4.22–5.81)
RDW: 13.4 % (ref 11.5–15.5)
WBC: 16.1 10*3/uL — ABNORMAL HIGH (ref 4.0–10.5)
nRBC: 0 % (ref 0.0–0.2)

## 2021-01-26 LAB — GLUCOSE, CAPILLARY
Glucose-Capillary: 104 mg/dL — ABNORMAL HIGH (ref 70–99)
Glucose-Capillary: 106 mg/dL — ABNORMAL HIGH (ref 70–99)
Glucose-Capillary: 120 mg/dL — ABNORMAL HIGH (ref 70–99)
Glucose-Capillary: 150 mg/dL — ABNORMAL HIGH (ref 70–99)

## 2021-01-26 LAB — PROTIME-INR
INR: 1.4 — ABNORMAL HIGH (ref 0.8–1.2)
Prothrombin Time: 16.4 seconds — ABNORMAL HIGH (ref 11.4–15.2)

## 2021-01-26 MED ORDER — FUROSEMIDE 10 MG/ML IJ SOLN: 20.0000 mg | Freq: Two times a day (BID) | INTRAMUSCULAR | Status: DC

## 2021-01-26 MED ORDER — FUROSEMIDE 10 MG/ML IJ SOLN
40.0000 mg | Freq: Two times a day (BID) | INTRAMUSCULAR | Status: AC
  Administered 2021-01-26 – 2021-01-27 (×4): 40 mg via INTRAVENOUS
  Filled 2021-01-26 (×4): qty 4

## 2021-01-26 MED ORDER — POTASSIUM CHLORIDE 10 MEQ/100ML IV SOLN
10.0000 meq | INTRAVENOUS | Status: AC
  Administered 2021-01-26 (×3): 10 meq via INTRAVENOUS
  Filled 2021-01-26 (×3): qty 100

## 2021-01-26 MED ORDER — FUROSEMIDE 40 MG PO TABS
40.0000 mg | ORAL_TABLET | Freq: Every day | ORAL | Status: DC
  Administered 2021-01-28 – 2021-01-30 (×3): 40 mg via ORAL
  Filled 2021-01-26 (×3): qty 1

## 2021-01-26 MED ORDER — POTASSIUM CHLORIDE CRYS ER 20 MEQ PO TBCR
20.0000 meq | EXTENDED_RELEASE_TABLET | Freq: Every day | ORAL | Status: DC
  Administered 2021-01-27 – 2021-01-29 (×3): 20 meq via ORAL
  Filled 2021-01-26 (×3): qty 1

## 2021-01-26 MED ORDER — METOPROLOL TARTRATE 12.5 MG HALF TABLET
12.5000 mg | ORAL_TABLET | Freq: Two times a day (BID) | ORAL | Status: DC
  Administered 2021-01-26 – 2021-01-30 (×9): 12.5 mg via ORAL
  Filled 2021-01-26 (×9): qty 1

## 2021-01-26 MED ORDER — ~~LOC~~ CARDIAC SURGERY, PATIENT & FAMILY EDUCATION: Freq: Once | Status: AC

## 2021-01-26 NOTE — Progress Notes (Signed)
CARDIAC REHAB PHASE I   Offered to walk with pt. Pt declining at this time. States fatigue and nausea. Plans to ambulate with RN around dinner. Pt currently demonstrating 750 on IS. Pt states he is active at home and likes to walk. Looking forward to CRP II. Will continue to follow and encourage ambulation.  [id number] Rufina Falco, RN BSN 01/26/2021 3:09 PM

## 2021-01-26 NOTE — Progress Notes (Addendum)
TCTS DAILY ICU PROGRESS NOTE                   [address]            ,[place] [id number]          [phone]   2 Days Post-Op Procedure(s) (LRB): MINIMALLY INVASIVE MITRAL VALVE REPAIR (MVR) USING 4D MEMO RING SIZE 32MM (Right) TRANSESOPHAGEAL ECHOCARDIOGRAM (TEE) (N/A)  Total Length of Stay:  LOS: 2 days   Subjective:  Awake and alert, sitting up in the bedside chair. Had some nausea yesterday and this morning. No appetite. Denies abdominal pain. He is not passing gas yet.     Objective: Vital signs in last 24 hours: Temp:  [98.06 F (36.7 C)-98.8 F (37.1 C)] 98.8 F (37.1 C) (03/24 0400) Pulse Rate:  [64-81] 78 (03/24 0500) Cardiac Rhythm: Normal sinus rhythm (03/24 0400) Resp:  [12-41] 20 (03/24 0500) BP: (97-144)/(52-85) 144/67 (03/24 0500) SpO2:  [92 %-98 %] 95 % (03/24 0500) Arterial Line BP: (101-140)/(42-49) 129/47 (03/23 1700) Weight:  [84 kg] 84 kg (03/24 0500)  Filed Weights   01/24/21 1730 01/25/21 0600 01/26/21 0500  Weight: 79.5 kg 84.7 kg 84 kg    Weight change: 4.5 kg   Hemodynamic parameters for last 24 hours: PAP: (28-39)/(8-14) 28/8  Intake/Output from previous day: 03/23 0701 - 03/24 0700 In: 106.5 [I.V.:6.5; IV Piggyback:100] Out: 1485 [Urine:1205; Chest Tube:280]  Intake/Output this shift: No intake/output data recorded.  Current Meds: Scheduled Meds: . acetaminophen  1,000 mg Oral Q6H  . aspirin EC  81 mg Oral Daily  . bisacodyl  10 mg Oral Daily   Or  . bisacodyl  10 mg Rectal Daily  . Chlorhexidine Gluconate Cloth  6 each Topical Daily  . docusate sodium  200 mg Oral Daily  . enoxaparin (LOVENOX) injection  30 mg Subcutaneous QHS  . furosemide  40 mg Intravenous BID  . [START ON 01/28/2021] furosemide  40 mg Oral Daily  . ketorolac  15 mg Intravenous Q6H  . magnesium oxide  200 mg Oral Daily  . mouth rinse  15 mL Mouth Rinse BID  . metoprolol tartrate  12.5 mg Oral BID  . pantoprazole  40 mg Oral Daily  .  [START ON 01/27/2021] potassium chloride  20 mEq Oral Daily  . sodium chloride flush  10-40 mL Intracatheter Q12H  . sodium chloride flush  3 mL Intravenous Q12H  . warfarin  2.5 mg Oral q1600  . Warfarin - Physician Dosing Inpatient   Does not apply q1600   Continuous Infusions: . sodium chloride    . lactated ringers     PRN Meds:.metoprolol tartrate, morphine injection, ondansetron (ZOFRAN) IV, oxyCODONE, sodium chloride flush, sodium chloride flush, traMADol  General appearance: alert, cooperative and mild distress Neurologic: intact Heart:  NSR ~80/min, very soft systolic murmur.  Lungs: clear to auscultation bilaterally Abdomen: Soft, NT, absent bowel sounds.  Extremities: Warm and well perfused, mild LE edema  Wound: Has dry Aquacel dressing to right chest incision.   Lab Results: CBC: Recent Labs    01/25/21 1700 01/26/21 0511  WBC 18.0* 16.1*  HGB 10.3* 10.4*  HCT 29.2* 30.3*  PLT 105* 100*   BMET:  Recent Labs    01/25/21 1700 01/26/21 0511  NA 131* 134*  K 4.1 3.9  CL 102 102  CO2 25 27  GLUCOSE 142* 104*  BUN 19 17  CREATININE 0.77 0.81  CALCIUM 8.1* 8.4*  CMET: Lab Results  Component Value Date   WBC 16.1 (H) 01/26/2021   HGB 10.4 (L) 01/26/2021   HCT 30.3 (L) 01/26/2021   PLT 100 (L) 01/26/2021   GLUCOSE 104 (H) 01/26/2021   ALT 23 01/20/2021   AST 24 01/20/2021   NA 134 (L) 01/26/2021   K 3.9 01/26/2021   CL 102 01/26/2021   CREATININE 0.81 01/26/2021   BUN 17 01/26/2021   CO2 27 01/26/2021   INR 1.4 (H) 01/26/2021   HGBA1C 5.7 (H) 01/20/2021      PT/INR:  Recent Labs    01/26/21 0511  LABPROT 16.4*  INR 1.4*   Radiology: [facility] Chest Port 1 View  Result Date: 01/26/2021 CLINICAL DATA:  Chest tube.  Open-heart surgery. EXAM: PORTABLE CHEST 1 VIEW COMPARISON:  01/25/2021 FINDINGS: Interim removal of Swan-Ganz catheter. Left IJ sheath in stable position. Right chest tube in stable position. Mediastinum and hilar structures normal.  Prior cardiac valve replacement. Stable cardiomegaly and pulmonary venous prominence. Persistent bibasilar atelectasis and or infiltrates. Small left pleural effusion again noted. No pneumothorax identified. IMPRESSION: 1. Interim removal of Swan-Ganz catheter. Left IJ sheath in stable position. Right chest tube in stable position. No pneumothorax. 2. Persistent bibasilar atelectasis and or infiltrates. Small left pleural effusion again noted. 3. Prior cardiac valve replacement. Stable cardiomegaly and pulmonary venous prominence. Electronically Signed   By: Marcello Moores  Register   On: 01/26/2021 06:57     Assessment/Plan: S/P Procedure(s) (LRB): MINIMALLY INVASIVE MITRAL VALVE REPAIR (MVR) USING 4D MEMO RING SIZE 32MM (Right) TRANSESOPHAGEAL ECHOCARDIOGRAM (TEE) (N/A)  -POD-2 MV repair for bi-leaflet MVP and severe MR. Stable VS and cardiac rhythm.  Advance activity. Continue anticoagulation with Coumadin, INR 1.4. Begin low-dose B-blocker.    -Expected acute blood loss anemia- Hct trending up, CT output minimal.  Monitor.   -Expected post-op volume excess-- Wt 5kg above pre-op, begin diuresis   -Lower lobe ATX- ambulate, IS.   -DVT PPX- mobilize,  Continue daily enoxaparin  -Endo- no h/o DM, glucose well controlled. Continue SSI  -Nausea- abdominal exam benign. Reglan IV q6h for 24 hours. Encouraged to continue with liquids and bland diet for now.     Antony Odea, PA-C [phone] 01/26/2021 7:05 AM   I have seen and examined the patient and agree with the assessment and plan as outlined.  Looks good.  Maintaining NSR w/ occasional PAC's and dropped beats, stable BP.  Will start low dose beta blocker and supplement potassium.  Mobilize.  Diuresis.  D/C lines and Foley.  Leave chest tubes in for now.  Transfer 4E  Rexene Alberts, MD 01/26/2021 7:50 AM

## 2021-01-27 ENCOUNTER — Telehealth: Payer: Self-pay | Admitting: Cardiology

## 2021-01-27 LAB — BASIC METABOLIC PANEL
Anion gap: 7 (ref 5–15)
BUN: 17 mg/dL (ref 8–23)
CO2: 33 mmol/L — ABNORMAL HIGH (ref 22–32)
Calcium: 9 mg/dL (ref 8.9–10.3)
Chloride: 95 mmol/L — ABNORMAL LOW (ref 98–111)
Creatinine, Ser: 0.84 mg/dL (ref 0.61–1.24)
GFR, Estimated: >60 mL/min (ref >60)
Glucose, Bld: 107 mg/dL — ABNORMAL HIGH (ref 70–99)
Potassium: 3.8 mmol/L (ref 3.5–5.1)
Sodium: 135 mmol/L (ref 135–145)

## 2021-01-27 LAB — CBC
HCT: 33.6 % — ABNORMAL LOW (ref 39.0–52.0)
Hemoglobin: 11.4 g/dL — ABNORMAL LOW (ref 13.0–17.0)
MCH: 33.5 pg (ref 26.0–34.0)
MCHC: 33.9 g/dL (ref 30.0–36.0)
MCV: 98.8 fL (ref 80.0–100.0)
Platelets: 121 10*3/uL — ABNORMAL LOW (ref 150–400)
RBC: 3.4 MIL/uL — ABNORMAL LOW (ref 4.22–5.81)
RDW: 13.2 % (ref 11.5–15.5)
WBC: 17.5 10*3/uL — ABNORMAL HIGH (ref 4.0–10.5)
nRBC: 0 % (ref 0.0–0.2)

## 2021-01-27 LAB — PROTIME-INR
INR: 1.2 (ref 0.8–1.2)
Prothrombin Time: 14.5 seconds (ref 11.4–15.2)

## 2021-01-27 MED ORDER — POTASSIUM CHLORIDE CRYS ER 20 MEQ PO TBCR
40.0000 meq | EXTENDED_RELEASE_TABLET | Freq: Once | ORAL | Status: AC
  Administered 2021-01-27: 40 meq via ORAL
  Filled 2021-01-27: qty 2

## 2021-01-27 MED ORDER — KETOROLAC TROMETHAMINE 15 MG/ML IJ SOLN
15.0000 mg | Freq: Once | INTRAMUSCULAR | Status: AC
  Administered 2021-01-27: 15 mg via INTRAVENOUS
  Filled 2021-01-27: qty 1

## 2021-01-27 NOTE — Telephone Encounter (Signed)
Patient has TOC appointment with [name] on 02/14/2021 at 11:15 am per Roby Lofts.

## 2021-01-27 NOTE — Discharge Instructions (Signed)

## 2021-01-27 NOTE — Progress Notes (Signed)
EPW removed, VSS. Bedrest till 1100

## 2021-01-27 NOTE — Discharge Summary (Signed)
Physician Discharge Summary  Patient ID: Thomas [name] MRN: [id number] DOB/AGE: 01/10/1946 75 y.o.  Admit date: 01/24/2021 Discharge date: 01/30/2021  Admission Diagnoses:  Mitral valve prolapse Mitral regurgitation History of prostate cancer   Discharge Diagnoses:   S/P minimally-invasive mitral valve repair Mitral valve prolapse Mitral regurgitation History of prostate cancer Expected acute blood loss anemia   Discharged Condition: good  History of Present Illness: Patient is a 75 year old male who has been referred for surgical consultation to discuss treatment options for management of mitral valve prolapse with severe mitral regurgitation.   Patient states that he was first noted to have a heart murmur in 1969 when he joined the [employer].  He was diagnosed with mitral valve prolapse.  Approximately 10 years ago his primary care physician noted that his heart murmur had become more prominent and an echocardiogram was performed confirming the presence of mitral valve prolapse with moderate mitral regurgitation.  The patient has been followed carefully ever since, most recently by Dr. [name].  Follow-up transthoracic echocardiogram performed October 18, 2020 revealed significant progression of disease with bileaflet prolapse and likely severe mitral regurgitation.  There remain normal left ventricular size and systolic function with moderate left atrial enlargement.  The patient subsequently underwent transesophageal echocardiogram November 23, 2020 which confirmed the presence of myxomatous degenerative disease with bileaflet prolapse and severe mitral regurgitation.  Cardiothoracic surgical consultation was requested.   The patient has been seen in consultation and counseled at length regarding the indications, risks and potential benefits of surgery.  All questions have been answered, and the patient provides full informed consent for the operation as described.   Hospital  Course: Thomas [name] was admitted to the hospital for elective surgery on 01/24/2021 and taken to the operating room where minimally invasive mitral valve repair was accomplished.  Please see the operative note below for details.  At the conclusion of the procedure, Thomas [name] separated from cardiopulmonary bypass without difficulty.  He was extubated in the operating room prior to transfer to the ICU.  He remained hemodynamically stable on low-dose Neo-Synephrine.  He did not require inotropic support.  Initially, he was mildly hypoxic and hypercarbic and was supported with oxygen therapy.  His respiratory status gradually improved.  On the morning of the first postoperative day, the monitoring lines were removed.  He was mobilized with the assistance of physical therapy.  He was in a stable sinus rhythm. He was transferred to the Progressive Care on post-op day 2. Mobility gradually improved. Chest tube drainage tapered off allowing for chest tubes to be removed on post op day 2.  Pacing wires were also removed without complication. He remained in NSR with occasional sinus tachycardia. The supplemental oxygen was weaned off without difficulty.  The patient developed hematuria.  He initially continued to void without difficulty.  However, he developed urinary retention.  It was felt this was likely due to clots in his bladder.  A foley catheter was replaced on 01/28/2021.  Initially the urine was clear, however it again progressed to hematuria.  He was started on Flomax.  The urine cleared over the next 48 hours.  The Foley catheter was removed and he was able to void without any trouble.  His urine remained clear.   There was minimal response to the initial Coumadin dosing of 2.5 mg p.o. daily so his Coumadin dose was increased to 5 mg daily.  The INR at the time of discharge was 1.6.  Consults: None  Significant Diagnostic  Studies:   EXAM: PORTABLE CHEST 1 VIEW  COMPARISON:   01/25/2021  FINDINGS: Interim removal of Swan-Ganz catheter. Left IJ sheath in stable position. Right chest tube in stable position. Mediastinum and hilar structures normal. Prior cardiac valve replacement. Stable cardiomegaly and pulmonary venous prominence. Persistent bibasilar atelectasis and or infiltrates. Small left pleural effusion again noted. No pneumothorax identified.  IMPRESSION: 1. Interim removal of Swan-Ganz catheter. Left IJ sheath in stable position. Right chest tube in stable position. No pneumothorax. 2. Persistent bibasilar atelectasis and or infiltrates. Small left pleural effusion again noted. 3. Prior cardiac valve replacement. Stable cardiomegaly and pulmonary venous prominence.   Electronically Signed   By: Marcello Moores  Register   On: 01/26/2021 06:57  Treatments:   CARDIOTHORACIC SURGERY OPERATIVE NOTE  Date of Procedure:                01/24/2021  Preoperative Diagnosis:      Severe Mitral Regurgitation  Postoperative Diagnosis:    Same  Procedure:        Minimally-Invasive Mitral Valve Repair             Complex valvuloplasty including quadrangular resection of P2 segment of posterior leaflet             Sorin Memo 4D Ring Annuloplasty (size 73mm, catalog # [id number], serial # [id number])               Surgeon:        Valentina Gu. Roxy Manns, MD  Assistant:       Enid Cutter, PA-C  Anesthesia:    Rochele Pages, DO  Operative Findings: ? Barlow's type myxomatous degenerative disease ? Bileaflet prolapse with severe prolapse of P2 segment of posterior leaflet ? Ulcerated surface of posterior mitral annulus w/out significant calcification ? Multiple elongated primary chordae tendinae ? Type II dysfunction with severe mitral regurgitation ? Normal left ventricular systolic function ? Trace/mild residual mitral regurgitation after successful valve repair                      BRIEF CLINICAL NOTE AND  INDICATIONS FOR SURGERY  Patient is a 75 year old male who has been referred for surgical consultation to discuss treatment options for management of mitral valve prolapse with severe mitral regurgitation.  Patient states that he was first noted to have a heart murmur in 1969 when he joined the [employer]. He was diagnosed with mitral valve prolapse. Approximately 10 years ago his primary care physician noted that his heart murmur had become more prominent and an echocardiogram was performed confirming the presence of mitral valve prolapse with moderate mitral regurgitation. The patient has been followed carefully ever since, most recently by Dr. [name]. Follow-up transthoracic echocardiogram performed October 18, 2020 revealed significant progression of disease with bileaflet prolapse and likely severe mitral regurgitation. There remain normal left ventricular size and systolic function with moderate left atrial enlargement. The patient subsequently underwent transesophageal echocardiogram November 23, 2020 which confirmed the presence of myxomatous degenerative disease with bileaflet prolapse and severe mitral regurgitation. Cardiothoracic surgical consultation was requested.  The patient has been seen in consultation and counseled at length regarding the indications, risks and potential benefits of surgery.  All questions have been answered, and the patient provides full informed consent for the operation as described  Discharge Exam: Blood pressure 107/62, pulse 95, temperature 98.1 F (36.7 C), temperature source Oral, resp. rate 17, height 5\' 10"  (1.778 m), weight 77 kg, SpO2 98 %.   General appearance:  alert, cooperative and mild distress Neurologic: intact Heart:  NSR ~90-110/min Lungs: clear to auscultation bilaterally, sats acceptable on RA.  Abdomen: Soft, NT Extremities: Warm and well perfused, no LE edema  Wound: ight chest incision is intact and dry.   Disposition:   Discharge  Instructions    Amb Referral to Cardiac Rehabilitation   Complete by: As directed    Diagnosis: Valve Repair   Valve: Mitral   After initial evaluation and assessments completed: Virtual Based Care may be provided alone or in conjunction with Phase 2 Cardiac Rehab based on patient barriers.: Yes     Allergies as of 01/30/2021   No Known Allergies     Medication List    TAKE these medications   aspirin 81 MG EC tablet Take 1 tablet (81 mg total) by mouth daily. Swallow whole.   Calcium 600/Vitamin D3 600-800 MG-UNIT Tabs Generic drug: Calcium Carb-Cholecalciferol Take 2 tablets by mouth daily.   furosemide 40 MG tablet Commonly known as: LASIX Take 1 tablet (40 mg total) by mouth daily for 5 days.   Magnesium Oxide 250 MG Tabs Take 250 mg by mouth daily.   metoprolol tartrate 25 MG tablet Commonly known as: LOPRESSOR Take 0.5 tablets (12.5 mg total) by mouth 2 (two) times daily.   multivitamin with minerals tablet Take 1 tablet by mouth daily.   Potassium Chloride ER 20 MEQ Tbcr Take 10 mEq by mouth daily for 5 days.   tamsulosin 0.4 MG Caps capsule Commonly known as: FLOMAX Take 1 capsule (0.4 mg total) by mouth daily. Start taking on: January 31, 2021   traMADol 50 MG tablet Commonly known as: ULTRAM Take 1 tablet (50 mg total) by mouth every 4 (four) hours as needed for up to 5 days for moderate pain.   vitamin C 1000 MG tablet Take 1,000 mg by mouth daily.   warfarin 5 MG tablet Commonly known as: COUMADIN Take 1 tablet (5 mg total) by mouth daily at 4 PM.            Durable Medical Equipment  (From admission, onward)         Start     Ordered   01/30/21 1543  For home use only DME Walker rolling  Once       Question Answer Comment  Walker: With [place] Wheels   Patient needs a walker to treat with the following condition Imbalance      01/30/21 1542   01/27/21 1137  For home use only DME Walker rolling  Once       Question Answer Comment   Walker: With [place]   Patient needs a walker to treat with the following condition Weakness      01/27/21 1137          Follow-up Information    [facility] [place]. Go on 02/20/2021.   Specialty: Cardiothoracic Surgery Why: Your appointment is at 1pm.  Please arrive 30 minutes early for a chest x-ray to be performed by [facility] located on the first floor of the same building.  Contact information: [place], [place] on 03/09/2021.   Specialty: Cardiology Why: Your follow up ECHOCARDIOGRAM  is scheduled for 10:30am. Contact information: [address] [phone]       [name] T, PA-C Follow up on  02/14/2021.   Specialties: Cardiology, Physician Assistant Why: Please arrive 15 minutes early for your 11:15am post-hospital cardiology appointment Contact information: [address] [place] [address] [phone]        [place] [facility] Follow up on 02/02/2021.   Specialty: Cardiology Why: Please arrive 15 minutes early for your 10:30am coumadin clinic appointment. You will have your INR checked at this visit and receive advice for how to take your coumadin going forward Contact information: [address] [place]. Go on 02/06/2021.   Specialty: Cardiothoracic Surgery Why: Your appointment for suture removal is at 11:30am Contact information: [place], [place], [place] [facility] Follow up.   Why: rolling walker arranged- to be delivered to room prior to discharge Contact information: [address] [place]  [address] [phone]             The patient has been discharged on:   1.Beta Blocker:  Yes [ x  ]                              No   [   ]                              If No, reason:  2.Ace Inhibitor/ARB: Yes [   ]                                     No  [ x   ]                                     If No, reason: Blood pressure to soft.  May consider starting as outpatient.  3.Statin:   Yes [   ]                  No  [x]                   If No, reason: No coronary disease  4.Shela CommonsVelta Addison  [ x  ]                  No   [   ]                  If No, reason:     Signed: Antony Odea, PA-C 01/30/2021, 3:44 PM

## 2021-01-27 NOTE — Progress Notes (Signed)
CARDIAC REHAB PHASE I   PRE:  Rate/Rhythm: 86 SR  BP:  Sitting: 124/77      SaO2: 93 RA  MODE:  Ambulation: 270 ft   POST:  Rate/Rhythm: 97 SR  BP:  Sitting: 123/72    SaO2: 95 RA   After some encouragement and urinal use, pt agreeable to ambulate. Pt ambulated 2103ft in hallway assist of one with front wheel walker. Pt denies pain, SOB, or dizziness. Pt returned to recliner. Pt educated on site care and monitoring incision daily. Encouraged continued ambulation and IS use. Reviewed restrictions and exercise guidelines. Will refer to CRP II [place]. Pt requesting walker for home use, CM made aware. Pt also requested change in home Pharmacy to [facility] on [address], and changes made in chart. Will continue to follow and encourage ambulation.  [id number] Rufina Falco, RN BSN 01/27/2021 11:51 AM

## 2021-01-27 NOTE — Progress Notes (Signed)
Mobility Specialist: Progress Note   01/27/21 1634  Mobility  Activity Ambulated in hall  Level of Assistance Minimal assist, patient does 75% or more  Assistive Device Front wheel walker  Distance Ambulated (ft) 480 ft  Mobility Response Tolerated well  Mobility performed by Mobility specialist  $Mobility charge 1 Mobility   Pre-Mobility on 2 L/min [place]: 97 HR, 132/82 BP, 97% SpO2 Post-Mobility:           On RA: 100 HR, 150/79 BP, 90-92% SpO2           On 1 L/min [place] 95%   Pt attempted to urinate using urinal but was unable. Pt agreeable to ambulate after. Pt asx during ambulation and back to bed after walk. Pt c/o feeling SOB after returning to bed but was weaned down to 1 L/min [place].   [facility] Day Mobility Specialist Mobility Specialist Phone: [phone]

## 2021-01-27 NOTE — Progress Notes (Signed)
TCTS DAILY ICU PROGRESS NOTE                   [address]            [place] [address]          [phone]   3 Days Post-Op Procedure(s) (LRB): MINIMALLY INVASIVE MITRAL VALVE REPAIR (MVR) USING 4D MEMO RING SIZE 32MM (Right) TRANSESOPHAGEAL ECHOCARDIOGRAM (TEE) (N/A)  Total Length of Stay:  LOS: 3 days   Subjective: Transferred to 4E last evening.  Awake and alert, resting in bed. Walked a short distance in the hall yesterday.  Complains of sharp pain at the chest tube exit sites.  Objective: Vital signs in last 24 hours: Temp:  [98.2 F (36.8 C)-98.7 F (37.1 C)] 98.3 F (36.8 C) (03/25 0751) Pulse Rate:  [76-101] 91 (03/25 0751) Cardiac Rhythm: Normal sinus rhythm (03/24 1920) Resp:  [13-38] 18 (03/25 0751) BP: (96-143)/(61-85) 123/75 (03/25 0751) SpO2:  [92 %-95 %] 92 % (03/25 0751) Weight:  [80.7 kg] 80.7 kg (03/25 0335)  Filed Weights   01/25/21 0600 01/26/21 0500 01/27/21 0335  Weight: 84.7 kg 84 kg 80.7 kg    Weight change: -3.26 kg      Intake/Output from previous day: 03/24 0701 - 03/25 0700 In: 291.2 [P.O.:100; IV Piggyback:191.2] Out: 2725 [Urine:2625; Chest Tube:100]  Intake/Output this shift: No intake/output data recorded.  Current Meds: Scheduled Meds: . acetaminophen  1,000 mg Oral Q6H  . aspirin EC  81 mg Oral Daily  . bisacodyl  10 mg Oral Daily   Or  . bisacodyl  10 mg Rectal Daily  . Chlorhexidine Gluconate Cloth  6 each Topical Daily  . docusate sodium  200 mg Oral Daily  . enoxaparin (LOVENOX) injection  30 mg Subcutaneous QHS  . furosemide  40 mg Intravenous BID  . [START ON 01/28/2021] furosemide  40 mg Oral Daily  . ketorolac  15 mg Intravenous Once  . magnesium oxide  200 mg Oral Daily  . mouth rinse  15 mL Mouth Rinse BID  . metoprolol tartrate  12.5 mg Oral BID  . pantoprazole  40 mg Oral Daily  . potassium chloride  20 mEq Oral Daily  . sodium chloride flush  10-40 mL Intracatheter Q12H  . sodium  chloride flush  3 mL Intravenous Q12H  . warfarin  2.5 mg Oral q1600  . Warfarin - Physician Dosing Inpatient   Does not apply q1600   Continuous Infusions: . sodium chloride    . lactated ringers     PRN Meds:.metoprolol tartrate, morphine injection, ondansetron (ZOFRAN) IV, oxyCODONE, sodium chloride flush, sodium chloride flush, traMADol  General appearance: alert, cooperative and mild distress Neurologic: intact Heart:  NSR ~90-105/min, very soft systolic murmur.  Lungs: clear to auscultation bilaterally, sats acceptable on RA.  Abdomen: Soft, NT, absent bowel sounds.  Extremities: Warm and well perfused, minimal LE edema  Wound: Has dry Aquacel dressing to right chest incision.   Lab Results: CBC: Recent Labs    01/26/21 0511 01/27/21 0133  WBC 16.1* 17.5*  HGB 10.4* 11.4*  HCT 30.3* 33.6*  PLT 100* 121*   BMET:  Recent Labs    01/26/21 0511 01/27/21 0133  NA 134* 135  K 3.9 3.8  CL 102 95*  CO2 27 33*  GLUCOSE 104* 107*  BUN 17 17  CREATININE 0.81 0.84  CALCIUM 8.4* 9.0    CMET: Lab Results  Component Value Date   WBC 17.5 (  H) 01/27/2021   HGB 11.4 (L) 01/27/2021   HCT 33.6 (L) 01/27/2021   PLT 121 (L) 01/27/2021   GLUCOSE 107 (H) 01/27/2021   ALT 23 01/20/2021   AST 24 01/20/2021   NA 135 01/27/2021   K 3.8 01/27/2021   CL 95 (L) 01/27/2021   CREATININE 0.84 01/27/2021   BUN 17 01/27/2021   CO2 33 (H) 01/27/2021   INR 1.2 01/27/2021   HGBA1C 5.7 (H) 01/20/2021      PT/INR:  Recent Labs    01/27/21 0133  LABPROT 14.5  INR 1.2   Radiology: No results found.   Assessment/Plan: S/P Procedure(s) (LRB): MINIMALLY INVASIVE MITRAL VALVE REPAIR (MVR) USING 4D MEMO RING SIZE 32MM (Right) TRANSESOPHAGEAL ECHOCARDIOGRAM (TEE) (N/A)  -POD-3 MV repair for bi-leaflet MVP and severe MR. Stable VS and cardiac rhythm.  Advance activity. Remove the chest tubes and pacer wires today.  Continue anticoagulation with Coumadin, INR 1.2. -Expected acute  blood loss anemia- Hct trending up, CT output minimal.  Monitor.   -Hypertension with tachycardia- BP in 140's and has mild tachycardia. May be related the pain he is having from the chest tube. He was not on any antihypertensive at home. Will continue the metoprolol at 12.5mg  bid for now but will up-titrate if HTN persists.   -Expected post-op volume excess-- Wt down 5kg from yesterday but only has net 1.7L diuresis. Doubt accuracy. Continue oral Lasix.   -Lower lobe ATX- ambulate, IS, follow up CXR tomorrow.   -DVT PPX- mobilize,  Continue daily enoxaparin  -Endo- no h/o DM, glucose well controlled. Continue SSI  -Nausea- resolved.   Antony Odea, PA-C [phone] 01/27/2021 8:07 AM

## 2021-01-27 NOTE — Care Management Important Message (Signed)
Important Message  Patient Details  Name: Thomas [name] MRN: [id number] Date of Birth: [birth year]   Medicare Important Message Given:  Yes     Shelda Altes 01/27/2021, 9:10 AM

## 2021-01-27 NOTE — Progress Notes (Signed)
CT removed, pt tolerated well, no respiratory distress, bed rest till 1330

## 2021-01-28 ENCOUNTER — Inpatient Hospital Stay (HOSPITAL_COMMUNITY): Payer: PPO

## 2021-01-28 LAB — PROTIME-INR
INR: 1.1 (ref 0.8–1.2)
Prothrombin Time: 13.8 seconds (ref 11.4–15.2)

## 2021-01-28 MED ORDER — SODIUM CHLORIDE 0.9 % IR SOLN: 3000.0000 mL | Status: DC

## 2021-01-28 MED ORDER — WARFARIN SODIUM 5 MG PO TABS
5.0000 mg | ORAL_TABLET | Freq: Every day | ORAL | Status: DC
  Administered 2021-01-28 – 2021-01-30 (×3): 5 mg via ORAL
  Filled 2021-01-28 (×3): qty 1

## 2021-01-28 MED ORDER — TAMSULOSIN HCL 0.4 MG PO CAPS
0.4000 mg | ORAL_CAPSULE | Freq: Every day | ORAL | Status: DC
  Administered 2021-01-28 – 2021-01-30 (×3): 0.4 mg via ORAL
  Filled 2021-01-28 (×3): qty 1

## 2021-01-28 NOTE — Progress Notes (Signed)
CARDIAC REHAB PHASE I   PRE:  Rate/Rhythm: 84SR  BP:  Supine:   Sitting: 109/63   Standing:    SaO2: 97 2L oxygen   MODE:  Ambulation: 470 ft   POST:  Rate/Rhythm: 101 ST  BP:  Supine:   Sitting: 102/58  Standing:    SaO2: 96 RA  Pt tolerated exercise well and amb 434ft with a Kalen Neidert and standby assist. Pt had no c/o SOB, CP or pain. Pt stated he does not use oxygen at home, we walked without oxygen and pt remained at 96% while on the walk. When returned to the recliner pt SaO2 decreased to 90% on RA so 2L oxygen was reapplied.  Reinforced education on the importance of ambulation and IS usage, Pt archived 811ml today.  [id number] Kirby Funk ACSM-EP 01/28/2021 2:46 PM

## 2021-01-28 NOTE — Progress Notes (Addendum)
      [address]       [place] [address]             [phone]      4 Days Post-Op Procedure(s) (LRB): MINIMALLY INVASIVE MITRAL VALVE REPAIR (MVR) USING 4D MEMO RING SIZE 32MM (Right) TRANSESOPHAGEAL ECHOCARDIOGRAM (TEE) (N/A)   Subjective:  Patient complains of being unable to void.  He states he feels like he has to go to the bathroom but he cant.  Otherwise he is doing okay  Objective: Vital signs in last 24 hours: Temp:  [98.2 F (36.8 C)-98.4 F (36.9 C)] 98.2 F (36.8 C) (03/26 0033) Pulse Rate:  [82-95] 95 (03/26 0458) Cardiac Rhythm: Normal sinus rhythm (03/25 1900) Resp:  [15-21] 21 (03/26 0458) BP: (123-153)/(66-93) 140/80 (03/26 0200) SpO2:  [91 %-100 %] 93 % (03/26 0458) Weight:  [78.6 kg] 78.6 kg (03/26 0458)  Intake/Output from previous day: 03/25 0701 - 03/26 0700 In: -  Out: 225 [Urine:225]  General appearance: alert, cooperative and no distress Heart: regular rate and rhythm Lungs: clear to auscultation bilaterally Abdomen: soft, tender in lower abdominal area Extremities: edema trace Wound: clean and dry  Lab Results: Recent Labs    01/26/21 0511 01/27/21 0133  WBC 16.1* 17.5*  HGB 10.4* 11.4*  HCT 30.3* 33.6*  PLT 100* 121*   BMET:  Recent Labs    01/26/21 0511 01/27/21 0133  NA 134* 135  K 3.9 3.8  CL 102 95*  CO2 27 33*  GLUCOSE 104* 107*  BUN 17 17  CREATININE 0.81 0.84  CALCIUM 8.4* 9.0    PT/INR:  Recent Labs    01/28/21 0149  LABPROT 13.8  INR 1.1   ABG    Component Value Date/Time   PHART 7.259 (L) 01/24/2021 1705   HCO3 23.2 01/24/2021 1705   TCO2 25 01/24/2021 1705   ACIDBASEDEF 5.0 (H) 01/24/2021 1705   O2SAT 97.0 01/24/2021 1705   CBG (last 3)  Recent Labs    01/26/21 0702 01/26/21 1131 01/26/21 1530  GLUCAP 106* 120* 150*    Assessment/Plan: S/P Procedure(s) (LRB): MINIMALLY INVASIVE MITRAL VALVE REPAIR (MVR) USING 4D MEMO RING SIZE 32MM (Right) TRANSESOPHAGEAL  ECHOCARDIOGRAM (TEE) (N/A)  1. CV- NSR, BP elevated at times- will continue Lopressor, if BP remains elevated will start Cozaar tomorrow 2. INR 1.1- no response to coumadin will increase to 5 mg daily 3. Pulm- no acute issues, CTs are out, CXR w/o pneumothorax, + atelectasis, continue IS 4. Renal- no BMET today, weight is below baseline, continue Lasix as ordered, continue potassium 5. GU- yesterday developed hematuria in the afternoon, unfortunately this progressed with retention overnight into this morning, will start Flomax, foley placed, urine was initially bloody and then normalized..... I suspect with h/o prostate cancer he could have possibly had some clots in his bladder, if need be we can irrigate bladder.. will discuss need for ABX with Dr. Kipp Brood 6. Dispo- patient stable, relief after placement of foley, stable in NSR, will repeat BMET in AM, increase coumadin   LOS: 4 days    Ellwood Handler, PA-C 01/28/2021   Agree with above. Foley now in place. Continue pulmonary toilet. Subtherapeutic INR. Dispel planning.  Lorie Melichar Bary Leriche

## 2021-01-28 NOTE — Progress Notes (Signed)
Mobility Specialist - Progress Note   01/28/21 1145  Mobility  Activity Ambulated in hall  Level of Assistance Standby assist, set-up cues, supervision of patient - no hands on  Assistive Device Front wheel walker  Distance Ambulated (ft) 470 ft  Mobility Response Tolerated well  Mobility performed by Mobility specialist  $Mobility charge 1 Mobility   Pre-mobility: 76 HR, 109/72 BP, 98% SpO2 Post-mobility: 78 HR, 138/76 BP, 95% SpO2  Pt on 2L O2 throughout. He endorsed some lightheadedness when standing, but it didn't worsen w/ ambulation. Pt to recliner after walk.   Pricilla Handler Mobility Specialist Mobility Specialist Phone: [phone]

## 2021-01-29 LAB — BASIC METABOLIC PANEL
Anion gap: 8 (ref 5–15)
BUN: 29 mg/dL — ABNORMAL HIGH (ref 8–23)
CO2: 33 mmol/L — ABNORMAL HIGH (ref 22–32)
Calcium: 8.8 mg/dL — ABNORMAL LOW (ref 8.9–10.3)
Chloride: 95 mmol/L — ABNORMAL LOW (ref 98–111)
Creatinine, Ser: 1.1 mg/dL (ref 0.61–1.24)
GFR, Estimated: >60 mL/min (ref >60)
Glucose, Bld: 110 mg/dL — ABNORMAL HIGH (ref 70–99)
Potassium: 4.2 mmol/L (ref 3.5–5.1)
Sodium: 136 mmol/L (ref 135–145)

## 2021-01-29 LAB — PROTIME-INR
INR: 1.5 — ABNORMAL HIGH (ref 0.8–1.2)
Prothrombin Time: 18 seconds — ABNORMAL HIGH (ref 11.4–15.2)

## 2021-01-29 NOTE — Progress Notes (Signed)
Mobility Specialist - Progress Note   01/29/21 1037  Mobility  Activity Ambulated in hall  Level of Assistance Standby assist, set-up cues, supervision of patient - no hands on  Assistive Device Front wheel walker  Distance Ambulated (ft) 700 ft  Mobility Response Tolerated well  Mobility performed by Mobility specialist  $Mobility charge 1 Mobility   Pre-mobility: 85 HR, 113/68 BP, 94% SpO2 During mobility: 100 HR, 97% SpO2 Post-mobility: 90 HR, 99/85 BP, 95% SpO2  Pt on RA throughout. He was asx throughout ambulation. Pt to recliner after walk, call bell in reach.   Pricilla Handler Mobility Specialist Mobility Specialist Phone: [phone]

## 2021-01-29 NOTE — Progress Notes (Addendum)
      [address]       [place] [address]             [phone]      5 Days Post-Op Procedure(s) (LRB): MINIMALLY INVASIVE MITRAL VALVE REPAIR (MVR) USING 4D MEMO RING SIZE 32MM (Right) TRANSESOPHAGEAL ECHOCARDIOGRAM (TEE) (N/A)   Subjective:  Patient doing well, looks great.  + ambulation  Objective: Vital signs in last 24 hours: Temp:  [97.7 F (36.5 C)-98.9 F (37.2 C)] 98.9 F (37.2 C) (03/27 0813) Pulse Rate:  [77-102] 102 (03/27 0813) Cardiac Rhythm: Heart block (03/27 0939) Resp:  [15-17] 17 (03/27 0813) BP: (87-119)/(52-98) 101/61 (03/27 0813) SpO2:  [92 %-96 %] 96 % (03/27 0813) Weight:  [77.6 kg] 77.6 kg (03/27 0441)  Intake/Output from previous day: 03/26 0701 - 03/27 0700 In: -  Out: 2250 [Urine:2250] Intake/Output this shift: Total I/O In: 350 [P.O.:350] Out: 200 [Urine:200]  General appearance: alert, cooperative and no distress Heart: regular rate and rhythm Lungs: clear to auscultation bilaterally Abdomen: soft, non-tender; bowel sounds normal; no masses,  no organomegaly Extremities: extremities normal, atraumatic, no cyanosis or edema Wound: clean and dry  Lab Results: Recent Labs    01/27/21 0133  WBC 17.5*  HGB 11.4*  HCT 33.6*  PLT 121*   BMET:  Recent Labs    01/27/21 0133 01/29/21 0207  NA 135 136  K 3.8 4.2  CL 95* 95*  CO2 33* 33*  GLUCOSE 107* 110*  BUN 17 29*  CREATININE 0.84 1.10  CALCIUM 9.0 8.8*    PT/INR:  Recent Labs    01/29/21 0207  LABPROT 18.0*  INR 1.5*   ABG    Component Value Date/Time   PHART 7.259 (L) 01/24/2021 1705   HCO3 23.2 01/24/2021 1705   TCO2 25 01/24/2021 1705   ACIDBASEDEF 5.0 (H) 01/24/2021 1705   O2SAT 97.0 01/24/2021 1705   CBG (last 3)  Recent Labs    01/26/21 1131 01/26/21 1530  GLUCAP 120* 150*    Assessment/Plan: S/P Procedure(s) (LRB): MINIMALLY INVASIVE MITRAL VALVE REPAIR (MVR) USING 4D MEMO RING SIZE 32MM (Right) TRANSESOPHAGEAL  ECHOCARDIOGRAM (TEE) (N/A)  1. CV- NSR, BP improved- continue Lopressor 2. INR 1.5, will repeat coumadin at 5 mg daily 3. Pulm- no acute issues, continue IS 4. GU-developed urine resembling fruit punch yesterday afternoon per nursing, is clear again this morning, foley in place, continue Flomax 5. Renal- creatinine mostly stable, slight elevation likely due to urinary retention 6. Dispo- patient doing very well, foley remains in place for urinary retention likely due to clots in bladder causing mild blockage... will continue foley for now, in AM need to decide if want to trial removal again or d/c in place with follow up with primary urologist, INR up to 1.5 continue coumadin, repeat BMET in AM... patient likely for d/c in the next 24-48 hours   LOS: 5 days    Ellwood Handler, PA-C 01/29/2021   Agree with above Will likely perform voiding trial tomorrow Likely home tomorrow  Lajuana Matte

## 2021-01-30 ENCOUNTER — Other Ambulatory Visit: Payer: Self-pay | Admitting: Physician Assistant

## 2021-01-30 LAB — PROTIME-INR
INR: 1.6 — ABNORMAL HIGH (ref 0.8–1.2)
Prothrombin Time: 18 seconds — ABNORMAL HIGH (ref 11.4–15.2)

## 2021-01-30 LAB — BASIC METABOLIC PANEL
Anion gap: 6 (ref 5–15)
BUN: 24 mg/dL — ABNORMAL HIGH (ref 8–23)
CO2: 33 mmol/L — ABNORMAL HIGH (ref 22–32)
Calcium: 8.6 mg/dL — ABNORMAL LOW (ref 8.9–10.3)
Chloride: 97 mmol/L — ABNORMAL LOW (ref 98–111)
Creatinine, Ser: 0.96 mg/dL (ref 0.61–1.24)
GFR, Estimated: >60 mL/min (ref >60)
Glucose, Bld: 111 mg/dL — ABNORMAL HIGH (ref 70–99)
Potassium: 3.4 mmol/L — ABNORMAL LOW (ref 3.5–5.1)
Sodium: 136 mmol/L (ref 135–145)

## 2021-01-30 MED ORDER — METOPROLOL TARTRATE 25 MG PO TABS: 12.5000 mg | ORAL_TABLET | Freq: Two times a day (BID) | ORAL | 2 refills | Status: DC

## 2021-01-30 MED ORDER — WARFARIN SODIUM 5 MG PO TABS: 5.0000 mg | ORAL_TABLET | Freq: Every day | ORAL | 2 refills | Status: DC

## 2021-01-30 MED ORDER — TRAMADOL HCL 50 MG PO TABS: 50.0000 mg | ORAL_TABLET | ORAL | 0 refills | Status: AC | PRN

## 2021-01-30 MED ORDER — WARFARIN VIDEO: Freq: Once | Status: DC

## 2021-01-30 MED ORDER — COUMADIN BOOK
Freq: Once | Status: AC
  Filled 2021-01-30: qty 1

## 2021-01-30 MED ORDER — POTASSIUM CHLORIDE CRYS ER 20 MEQ PO TBCR
40.0000 meq | EXTENDED_RELEASE_TABLET | Freq: Once | ORAL | Status: AC
  Administered 2021-01-30: 40 meq via ORAL
  Filled 2021-01-30: qty 2

## 2021-01-30 MED ORDER — FUROSEMIDE 40 MG PO TABS: 40.0000 mg | ORAL_TABLET | Freq: Every day | ORAL | 0 refills | Status: DC

## 2021-01-30 MED ORDER — ASPIRIN 81 MG PO TBEC: 81.0000 mg | DELAYED_RELEASE_TABLET | Freq: Every day | ORAL | 11 refills | Status: DC

## 2021-01-30 MED ORDER — TAMSULOSIN HCL 0.4 MG PO CAPS: 0.4000 mg | ORAL_CAPSULE | Freq: Every day | ORAL | 2 refills | Status: DC

## 2021-01-30 MED ORDER — POTASSIUM CHLORIDE ER 20 MEQ PO TBCR: 10.0000 meq | EXTENDED_RELEASE_TABLET | Freq: Every day | ORAL | 0 refills | Status: DC

## 2021-01-30 MED FILL — WARFARIN SODIUM 5 MG TABLET: 5 | 30 days supply | Qty: 30 | Fill #0

## 2021-01-30 MED FILL — ASPIRIN LOW DOSE 81 MG TBEC: 81 | 30 days supply | Qty: 30 | Fill #0

## 2021-01-30 MED FILL — POTASSIUM CHLORIDE 20meqER: 20 | 5 days supply | Qty: 5 | Fill #0

## 2021-01-30 MED FILL — METOPROLOL TARTRATE 25 MG T: 25 | 30 days supply | Qty: 30 | Fill #0

## 2021-01-30 MED FILL — FUROSEMIDE 40 MG TABLET: 40 | 5 days supply | Qty: 5 | Fill #0

## 2021-01-30 MED FILL — traMADol HCL 50 MG TABS: 50 | 5 days supply | Qty: 30 | Fill #0

## 2021-01-30 MED FILL — TAMSULOSIN HCL 0.4 MG CAP: 0.4 | 30 days supply | Qty: 30 | Fill #0

## 2021-01-30 NOTE — Progress Notes (Signed)
Mobility Specialist - Progress Note   01/30/21 1256  Mobility  Activity Ambulated in hall  Level of Assistance Standby assist, set-up cues, supervision of patient - no hands on  Assistive Device Front wheel walker  Distance Ambulated (ft) 940 ft  Mobility Response Tolerated well  Mobility performed by Mobility specialist  $Mobility charge 1 Mobility   Pt states he recently urinated. Asx throughout ambulation. Pt to recliner after walk, VSS.   Pricilla Handler Mobility Specialist Mobility Specialist Phone: [phone]

## 2021-01-30 NOTE — Progress Notes (Signed)
      [address]       [place] [address]             [phone]      [name] has voided twice today with clear urine each time.  He has done well with ambulation.  He would like to go home.  Discharge this afternoon.  Continue with the Flomax.  I discussed early follow-up with his urologist in addition to the appointments that we have already made. He requested that we send his prescriptions to the transition of care pharmacy.  This has been done.  Enid Cutter, PA-C

## 2021-01-30 NOTE — Progress Notes (Signed)
CARDIAC REHAB PHASE I   PRE:  Rate/Rhythm: 98 SR  BP:  Sitting: 96/67      SaO2: 95 RA  MODE:  Ambulation: 700 ft   POST:  Rate/Rhythm: 111 ST  BP:  Sitting: 104/68    SaO2: 96 RA   Pt ambulated 743ft in hallway standby assist with front wheel walker. Pt denies SOB, dizziness, or pain. Pt able to increase distance and able to talk throughout walk. Pt returned to recliner. Hoping for d/c later today. Reviewed site care, restrictions and exercise guidelines. Referred to [facility] [place].  [id number] Rufina Falco, RN BSN 01/30/2021 10:36 AM

## 2021-01-30 NOTE — Care Management Important Message (Signed)
Important Message  Patient Details  Name: Dywane Peruski MRN: [id number] Date of Birth: Jun 02, 1946   Medicare Important Message Given:  Yes     Shelda Altes 01/30/2021, 9:01 AM

## 2021-01-30 NOTE — Progress Notes (Signed)
Mobility Specialist - Progress Note   01/30/21 1111  Mobility  Activity Ambulated to bathroom  Level of Assistance Standby assist, set-up cues, supervision of patient - no hands on  Assistive Device Front wheel walker  Distance Ambulated (ft) 30 ft  Mobility Response Tolerated well  Mobility performed by Mobility specialist  $Mobility charge 1 Mobility   Pt recently ambulated w/ Cardiac rehab. Assisted pt to bathroom and then to recliner to eat lunch, will attempt to f/u later for a longer walk.   Pricilla Handler Mobility Specialist Mobility Specialist Phone: [phone]

## 2021-01-30 NOTE — Progress Notes (Addendum)
TCTS DAILY ICU PROGRESS NOTE                   [address]            [place] [address]          [phone]   6 Days Post-Op Procedure(s) (LRB): MINIMALLY INVASIVE MITRAL VALVE REPAIR (MVR) USING 4D MEMO RING SIZE 32MM (Right) TRANSESOPHAGEAL ECHOCARDIOGRAM (TEE) (N/A)  Total Length of Stay:  LOS: 6 days   Subjective: Feels good this morning, says he rested well. Appetite better, having bowel movements.   Foley catheter replaced over the weekend for urinary retention and he was noted to have hematuria initially.   Objective: Vital signs in last 24 hours: Temp:  [97.9 F (36.6 C)-99 F (37.2 C)] 98.9 F (37.2 C) (03/28 0457) Pulse Rate:  [84-102] 85 (03/28 0457) Cardiac Rhythm: Normal sinus rhythm (03/27 1901) Resp:  [17-20] 20 (03/28 0457) BP: (99-124)/(61-71) 105/68 (03/28 0457) SpO2:  [93 %-96 %] 96 % (03/28 0457) Weight:  [77 kg] 77 kg (03/28 0457)  Filed Weights   01/28/21 0458 01/29/21 0441 01/30/21 0457  Weight: 78.6 kg 77.6 kg 77 kg    Weight change: -0.59 kg      Intake/Output from previous day: 03/27 0701 - 03/28 0700 In: 470 [P.O.:470] Out: 1700 [Urine:1700]  Intake/Output this shift: No intake/output data recorded.  Current Meds: Scheduled Meds: . aspirin EC  81 mg Oral Daily  . bisacodyl  10 mg Oral Daily   Or  . bisacodyl  10 mg Rectal Daily  . Chlorhexidine Gluconate Cloth  6 each Topical Daily  . docusate sodium  200 mg Oral Daily  . enoxaparin (LOVENOX) injection  30 mg Subcutaneous QHS  . furosemide  40 mg Oral Daily  . magnesium oxide  200 mg Oral Daily  . mouth rinse  15 mL Mouth Rinse BID  . metoprolol tartrate  12.5 mg Oral BID  . pantoprazole  40 mg Oral Daily  . potassium chloride  20 mEq Oral Daily  . sodium chloride flush  10-40 mL Intracatheter Q12H  . sodium chloride flush  3 mL Intravenous Q12H  . tamsulosin  0.4 mg Oral Daily  . warfarin  5 mg Oral q1600  . Warfarin - Physician Dosing Inpatient   Does  not apply q1600   Continuous Infusions: . sodium chloride    . lactated ringers    . sodium chloride irrigation     PRN Meds:.metoprolol tartrate, morphine injection, ondansetron (ZOFRAN) IV, oxyCODONE, sodium chloride flush, sodium chloride flush, traMADol  General appearance: alert, cooperative and mild distress Neurologic: intact Heart:  NSR ~90-110/min Lungs: clear to auscultation bilaterally, sats acceptable on RA.  Abdomen: Soft, NT Extremities: Warm and well perfused, no LE edema  Wound: ight chest incision is intact and dry.   Lab Results: CBC: No results for input(s): WBC, HGB, HCT, PLT in the last 72 hours. BMET:  Recent Labs    01/29/21 0207 01/30/21 0056  NA 136 136  K 4.2 3.4*  CL 95* 97*  CO2 33* 33*  GLUCOSE 110* 111*  BUN 29* 24*  CREATININE 1.10 0.96  CALCIUM 8.8* 8.6*    CMET: Lab Results  Component Value Date   WBC 17.5 (H) 01/27/2021   HGB 11.4 (L) 01/27/2021   HCT 33.6 (L) 01/27/2021   PLT 121 (L) 01/27/2021   GLUCOSE 111 (H) 01/30/2021   ALT 23 01/20/2021   AST 24 01/20/2021  NA 136 01/30/2021   K 3.4 (L) 01/30/2021   CL 97 (L) 01/30/2021   CREATININE 0.96 01/30/2021   BUN 24 (H) 01/30/2021   CO2 33 (H) 01/30/2021   INR 1.6 (H) 01/30/2021   HGBA1C 5.7 (H) 01/20/2021      PT/INR:  Recent Labs    01/30/21 0056  LABPROT 18.0*  INR 1.6*   Radiology: No results found.   Assessment/Plan: S/P Procedure(s) (LRB): MINIMALLY INVASIVE MITRAL VALVE REPAIR (MVR) USING 4D MEMO RING SIZE 32MM (Right) TRANSESOPHAGEAL ECHOCARDIOGRAM (TEE) (N/A)  -POD-6 MV repair for bi-leaflet MVP and severe MR. Stable VS and cardiac rhythm.  Independent with mobility.  Continue with anticoagulation, INR 1.6.   -Hematuria / urinary retention- Urine is clear, Flowmax started 2 days ago. Voiding trial this AM.  Possible discharge to home later today if able to void.   -Hypertension with tachycardia- control is reasonable. Will continue the metoprolol at  12.5mg  bid for   -Hypokalemia- supplement.  -Expected post-op volume excess--  Good response to diuresis, now below re-op Wt.   -DVT PPX- Continue daily enoxaparin  -Endo- no h/o DM, glucose well controlled. Continue SSI    Antony Odea, PA-C [phone] 01/30/2021 8:03 AM    I have seen and examined the patient and agree with the assessment and plan as outlined.  Try to d/c foley catheter, possibly d/c home later today or tomorrow.  Rexene Alberts, MD 01/30/2021

## 2021-01-30 NOTE — Plan of Care (Signed)
  Problem: Education: Goal: Knowledge of General Education information will improve Description: Including pain rating scale, medication(s)/side effects and non-pharmacologic comfort measures Outcome: Adequate for Discharge   Problem: Health Behavior/Discharge Planning: Goal: Ability to manage health-related needs will improve Outcome: Adequate for Discharge   Problem: Clinical Measurements: Goal: Ability to maintain clinical measurements within normal limits will improve Outcome: Adequate for Discharge Goal: Will remain free from infection Outcome: Adequate for Discharge Goal: Diagnostic test results will improve Outcome: Adequate for Discharge Goal: Respiratory complications will improve Outcome: Adequate for Discharge Goal: Cardiovascular complication will be avoided Outcome: Adequate for Discharge   Problem: Activity: Goal: Risk for activity intolerance will decrease Outcome: Adequate for Discharge   Problem: Nutrition: Goal: Adequate nutrition will be maintained Outcome: Adequate for Discharge   Problem: Coping: Goal: Level of anxiety will decrease Outcome: Adequate for Discharge   Problem: Elimination: Goal: Will not experience complications related to bowel motility Outcome: Adequate for Discharge Goal: Will not experience complications related to urinary retention Outcome: Adequate for Discharge   Problem: Pain Managment: Goal: General experience of comfort will improve Outcome: Adequate for Discharge   Problem: Safety: Goal: Ability to remain free from injury will improve Outcome: Adequate for Discharge   Problem: Skin Integrity: Goal: Risk for impaired skin integrity will decrease Outcome: Adequate for Discharge   Problem: Education: Goal: Will demonstrate proper wound care and an understanding of methods to prevent future damage Outcome: Adequate for Discharge Goal: Knowledge of disease or condition will improve Outcome: Adequate for Discharge Goal:  Knowledge of the prescribed therapeutic regimen will improve Outcome: Adequate for Discharge Goal: Individualized Educational Video(s) Outcome: Adequate for Discharge   Problem: Activity: Goal: Risk for activity intolerance will decrease Outcome: Adequate for Discharge   Problem: Cardiac: Goal: Will achieve and/or maintain hemodynamic stability Outcome: Adequate for Discharge   Problem: Clinical Measurements: Goal: Postoperative complications will be avoided or minimized Outcome: Adequate for Discharge   Problem: Respiratory: Goal: Respiratory status will improve Outcome: Adequate for Discharge   Problem: Skin Integrity: Goal: Wound healing without signs and symptoms of infection Outcome: Adequate for Discharge Goal: Risk for impaired skin integrity will decrease Outcome: Adequate for Discharge   Problem: Urinary Elimination: Goal: Ability to achieve and maintain adequate renal perfusion and functioning will improve Outcome: Adequate for Discharge   

## 2021-01-31 NOTE — Telephone Encounter (Signed)
**Note De-Identified Thomas [name] Obfuscation** Patient contacted regarding discharge from [name] on 01/30/2021.  Patient understands to follow up with provider [name], PA-c on 02/16/2021 at 11:15 at [facility] in [place], [place] [address]. T[facility] on 02/20/2021 at 1:00. Patient understands discharge instructions? Yes  Patient understands medications and regiment? Yes Patient understands to bring all medications to this visit? Yes  Ask patient:  Are you enrolled in My Chart: Yes   Postop Surgical Patients:                What is your wound status? "Good"    Any signs/ symptoms of infection (Temp, redness/ red streaks, swelling, purulent drainage, foul odor or smell)? "No"  .             Please do not place any creams/ lotions/ or antibiotic ointment on any surgical incisions/ wounds without physician approval. The patient is aware.  .             Do you have any questions about your medications? Yes. Should he continue Furosemide as he states that he is having a lot of difficulty urinating since his foley catheter was removed prior to his hospital d/c yesterday. He states that he is taking his Flomax as directed but he is still having issues. He reports that he cannot sleep due to this and that he feels weak and fatigued.  He does have a history of prostate cancer and states that he sees Dr [name] at [facility].  I advised him to contact Dr [name] and that I will forward this message to out Triage nurses for advisement on his Furosemide dose and that a nurse will be calling him back.         All medications (except pain medications) are to be filled by your Cardiologist AFTER your first post op appointment with them. The pt is aware.       Are you taking your pain medication? Yes  .             How is your pain controlled?  "Okay"           Pain level: 2 or 3   .             If you require a refill on pain medications, know that the same medication/ amount may not be prescribed or a refill may not  be given. He is aware.  Please contact your pharmacy for refill requests. He is aware.  .             Do you have help at home with ADL's? Yes  .             Please refer to your Pre/post surgery booklet, there is a lot of useful information in it that may answer any questions you may have.  .             Please note that it is ok to remove your surgical dressing, shower (soap/ water), and pat the incision dry.  The patient denies CP/discomfort, SOB, dizziness, nausea, diaphoresis, or headaches at this time.   [facility] [place],  [id number]

## 2021-01-31 NOTE — Telephone Encounter (Signed)
Will route to Dr. [name] and his nurse to review the bolded portion of the [facility] call.

## 2021-02-01 ENCOUNTER — Encounter (HOSPITAL_COMMUNITY): Payer: Self-pay | Admitting: Thoracic Surgery (Cardiothoracic Vascular Surgery)

## 2021-02-01 ENCOUNTER — Telehealth (HOSPITAL_COMMUNITY): Payer: Self-pay

## 2021-02-01 DIAGNOSIS — R338 Other retention of urine: Secondary | ICD-10-CM | POA: Diagnosis not present

## 2021-02-01 NOTE — Telephone Encounter (Signed)
I s/w the pt and advised of recommendations per Dr. [name] and pre op provider. Pt states he has not been drinking anything and feels this could be part of the problem. He states he started drinking a plant based protein drink Perk and thinks this may help him. I did advise he needs to drink water as well. Pt said he did produce a small stead stream yesterday, though for a short time. I did advise the pt that if he is not producing a better flow and pushing out more fluid that he needs to call Dr. Lynne Logan office. Pt is thankful and grateful for the call and the follow up.

## 2021-02-01 NOTE — Telephone Encounter (Signed)
Pt called in and stated that he seen the urologist and they were able to get out " backed up urine"  From his bladder.  He wanted [name] to know that he is feeling much much better  Best number - [phone]

## 2021-02-01 NOTE — Telephone Encounter (Signed)
Pt insurance is active and benefits verified through [employer]. Co-pay $15.00, DED $0.00/$0.00 met, out of pocket $3,450.00/$870.00 met, co-insurance 0%. No pre-authorization required. Percy/[facility], 02/01/21 @ 4:04PM, QMV#[id number]  Will contact patient to see if he is interested in the Cardiac Rehab Program. If interested, patient will need to complete follow up appt. Once completed, patient will be contacted for scheduling upon review by the RN Navigator.

## 2021-02-01 NOTE — Telephone Encounter (Signed)
I will send as FYI to Dr. [name] and his nurse Jeannene Patella.

## 2021-02-01 NOTE — Telephone Encounter (Signed)
Agree. Needs to contact [name], urology. May need foley replacement Continue with lasix due to recent volume overload post op.   Candee Furbish, MD

## 2021-02-01 NOTE — Telephone Encounter (Signed)
Please contact Mr. Fessel and let him know that Dr. [name] is recommended he follow-up with urology ([name]) for recommendations on his Foley catheter.  He will need to continue his furosemide at this time due to his excess fluid postoperatively.  Thank you.

## 2021-02-01 NOTE — Telephone Encounter (Signed)
Attempted to call patient in regards to Cardiac Rehab - LM on VM 

## 2021-02-01 NOTE — Addendum Note (Signed)
Addendum  created 02/01/21 1058 by Haeley Fordham, March Rummage, DO   Intraprocedure Event edited, Intraprocedure Staff edited

## 2021-02-02 ENCOUNTER — Other Ambulatory Visit: Payer: Self-pay

## 2021-02-02 ENCOUNTER — Other Ambulatory Visit: Payer: Self-pay | Admitting: Urology

## 2021-02-02 ENCOUNTER — Ambulatory Visit (INDEPENDENT_AMBULATORY_CARE_PROVIDER_SITE_OTHER): Payer: PPO | Admitting: *Deleted

## 2021-02-02 DIAGNOSIS — C61 Malignant neoplasm of prostate: Secondary | ICD-10-CM

## 2021-02-02 DIAGNOSIS — Z9889 Other specified postprocedural states: Secondary | ICD-10-CM | POA: Diagnosis not present

## 2021-02-02 DIAGNOSIS — Z5181 Encounter for therapeutic drug level monitoring: Secondary | ICD-10-CM | POA: Diagnosis not present

## 2021-02-02 LAB — POCT INR: INR: 1.9 — AB (ref 2.0–3.0)

## 2021-02-02 NOTE — Patient Instructions (Addendum)
A full discussion of the nature of anticoagulants has been carried out.  A benefit risk analysis has been presented to the patient, so that they understand the justification for choosing anticoagulation at this time. The need for frequent and regular monitoring, precise dosage adjustment and compliance is stressed.  Side effects of potential bleeding are discussed.  The patient should avoid any OTC items containing aspirin or ibuprofen, and should avoid great swings in general diet.  Avoid alcohol consumption.  Call if any signs of abnormal bleeding.   Description   Start taking warfarin 1 tablet daily except for 1/2 a tablet on Mondays and Fridays. Recheck INR in 1 week. [facility] [phone].

## 2021-02-03 NOTE — Telephone Encounter (Signed)
Thank you for the update.  Glad he is feeling better. Candee Furbish, MD

## 2021-02-06 ENCOUNTER — Other Ambulatory Visit: Payer: Self-pay

## 2021-02-06 ENCOUNTER — Ambulatory Visit (INDEPENDENT_AMBULATORY_CARE_PROVIDER_SITE_OTHER): Payer: Self-pay | Admitting: *Deleted

## 2021-02-06 DIAGNOSIS — Z4802 Encounter for removal of sutures: Secondary | ICD-10-CM

## 2021-02-06 NOTE — Progress Notes (Signed)
Patient arrived for nurse visit to remove sutures post-Mini MVR 3/22 by Dr. Roxy Manns.  Two sutures removed with no signs or symptoms of infection noted.  Both incisions well approximated. Patient tolerated suture removal well.  Patient instructed to keep the incision site clean and dry. Patient acknowledged instructions given.  All questions answered.

## 2021-02-08 ENCOUNTER — Ambulatory Visit (INDEPENDENT_AMBULATORY_CARE_PROVIDER_SITE_OTHER): Payer: PPO | Admitting: *Deleted

## 2021-02-08 ENCOUNTER — Other Ambulatory Visit: Payer: Self-pay

## 2021-02-08 DIAGNOSIS — Z9889 Other specified postprocedural states: Secondary | ICD-10-CM | POA: Diagnosis not present

## 2021-02-08 DIAGNOSIS — Z5181 Encounter for therapeutic drug level monitoring: Secondary | ICD-10-CM | POA: Diagnosis not present

## 2021-02-08 LAB — POCT INR: INR: 1.3 — AB (ref 2.0–3.0)

## 2021-02-08 NOTE — Patient Instructions (Signed)
Description   Take 1.5 tablets of warfarin today and then start taking warfarin 1 tablet daily. Recheck INR in 1 week. Coumadin Clinic [phone].

## 2021-02-13 NOTE — Progress Notes (Signed)
Cardiology Office Note:    Date:  02/14/2021   ID:  [name], DOB Sep 25, 1946, MRN [id number]  PCP:  [name], MD   [facility]  Cardiologist:  Candee Furbish, MD   Electrophysiologist:  None       Referring MD: [name], MD   Chief Complaint:  Hospitalization Follow-up (S/p MV repair)    Patient Profile:     Thomas [name] is a 75 y.o. male with:   MVP with mitral regurgitation   S/p min inv MV repair 01/2021  Prostate CA  BPH   Prior CV studies: Pre CABG Dopplers 01/20/21 No ICA stenosis   RIGHT/LEFT HEART CATH AND CORONARY ANGIOGRAPHY 12/23/2020 Narrative 1.  Patent coronary arteries with mild nonobstructive narrowing at the ostium of the left main and ostium of the left circumflex, no significant stenosis present. 2.  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  His post op course was fairly uneventful.  He was started on Warfarin Rx.  He returns for f/u.  He is here alone.  He had some issues with urinary retention after DC and had to have his urologist put a Foley cath back in ([name]).  He also had some n/v and diarrhea after eating shrimp the other day.  He thinks he had food poisoning.  His symptoms have resolved.  He does not have significant chest soreness, shortness of breath.  His appetite is ok.  He has not had any fevers, bleeding.     Past Medical History:  Diagnosis Date  . Arthritis   . Bilateral inguinal hernia   . BPH (benign prostatic hyperplasia)   . Cancer [facility])    prostate  cancer; per patient being followed by Dr [name] at [facility] urology ; currently no chemotherapy   . Mitral regurgitation   . Mitral valve prolapse    now seeing [name]   . Pneumonia    At age 73  . S/P minimally-invasive mitral valve repair 01/24/2021   Complex valvuloplasty including quadrangular resection of posterior leaflet with 32 mm Sorin Memo 4D ring annuloplasty via right mini thoracotomy result    Current Medications: Current Meds  Medication Sig  . Ascorbic Acid (VITAMIN C) 1000 MG tablet Take 1,000 mg by mouth daily.  Marland Kitchen aspirin EC 81 MG tablet Take 81 mg by mouth daily. Swallow whole.  . Calcium Carb-Cholecalciferol (CALCIUM 600/VITAMIN D3) 600-800 MG-UNIT TABS Take 2 tablets by mouth daily.  . Magnesium Oxide 250 MG TABS Take 250 mg by mouth daily.  . metoprolol tartrate (LOPRESSOR) 25 MG tablet Take 0.5 tablets (12.5 mg total) by mouth 2 (two) times daily.  . Multiple Vitamins-Minerals (MULTIVITAMIN WITH MINERALS) tablet Take 1 tablet by mouth daily.  . tamsulosin (FLOMAX) 0.4 MG CAPS capsule Take 1 capsule (0.4 mg total) by mouth daily.  Marland Kitchen warfarin (COUMADIN) 5 MG tablet Take 1 tablet (5 mg total) by mouth daily at 4 PM.  . [DISCONTINUED] amoxicillin (AMOXIL) 500 MG capsule Take 4 capsules (2,000 mg total) by mouth once for 1 dose. 1-2 hours  prior to dental procedures.     Allergies:   Patient has no known allergies.   Social History   Tobacco Use  . Smoking status: Never Smoker  . Smokeless tobacco: Never Used  Vaping Use  . Vaping Use: Never used  Substance Use Topics  . Alcohol use: Not Currently    Comment: seldom   . Drug use: No     Family Hx: The patient's family history is not on file.  Review of Systems  Gastrointestinal: Positive for diarrhea, nausea and vomiting.  Genitourinary: Positive for incomplete emptying.     EKGs/Labs/Other Test Reviewed:    EKG:  EKG is   ordered today.  The ekg ordered today demonstrates normal sinus rhythm, HR 90,  normal axis, no ST-TW changes, QTc 479  Recent Labs: 01/20/2021: ALT 23 01/25/2021: Magnesium 2.1 01/27/2021: Hemoglobin 11.4; Platelets 121 01/30/2021: BUN 24; Creatinine, Ser 0.96; Potassium 3.4; Sodium 136   Recent Lipid Panel No results found for: CHOL, TRIG, HDL, CHOLHDL, LDLCALC, LDLDIRECT    Risk Assessment/Calculations:      Physical Exam:    VS:  BP 108/60   Pulse 90   Ht 5\' 10"  (1.778 m)   Wt 176 lb 9.6 oz (80.1 kg)   SpO2 96%   BMI 25.34 kg/m     Wt Readings from Last 3 Encounters:  02/14/21 176 lb 9.6 oz (80.1 kg)  01/30/21 169 lb 12.8 oz (77 kg)  01/20/21 175 lb 4 oz (79.5 kg)     Constitutional:      Appearance: Healthy appearance. Not in distress.  Neck:     Vascular: JVD normal.  Pulmonary:     Effort: Pulmonary effort is normal.     Breath sounds: No wheezing. No rales.  Chest:     Comments: R chest incision well healed; +surrounding ecchymosis Cardiovascular:     Normal rate. Regular rhythm. Normal S1. Normal S2.     Murmurs: There is no murmur.  Edema:    Peripheral edema absent.  Abdominal:     Palpations: Abdomen is soft. There is no hepatomegaly.  Skin:    General: Skin is warm and dry.  Neurological:     General: No focal deficit present.     Mental Status: Alert and oriented to person, place and time.     Cranial Nerves: Cranial nerves are intact.         ASSESSMENT & PLAN:    1. Severe mitral valve regurgitation 2. S/P mitral valve repair He is doing well after recent MV repair.  He is tolerating anticoagulation and has a Coumadin clinic visit today.  He remains in normal sinus rhythm.  He understands the need for SBE prophylaxis and I have sent in a Rx for Amoxicillin for him to use for dental work.  He has an echocardiogram scheduled next month and has a f/u with Dr. Guy Sandifer office next week.  I will have him see Dr. [name] in 3 mos.  I have encouraged him to pursue cardiac rehabilitation.     Dispo:  Return in about 3 months  (around 05/16/2021) for Routine Follow Up with Dr. [name], in person.   Medication Adjustments/Labs and Tests Ordered: Current medicines are reviewed at length with the patient today.  Concerns regarding medicines are outlined above.  Tests Ordered: Orders Placed This Encounter  Procedures  . EKG 12-Lead   Medication Changes: Meds ordered this encounter  Medications  . DISCONTD: amoxicillin (AMOXIL) 500 MG capsule  Sig: Take 4 capsules (2,000 mg total) by mouth once for 1 dose. 1-2 hours prior to dental procedures.    Dispense:  4 capsule    Refill:  1  . DISCONTD: amoxicillin (AMOXIL) 500 MG capsule    Sig: Take 4 capsules (2,000 mg total) by mouth once for 1 dose. 1-2 hours prior to dental procedures.    Dispense:  4 capsule    Refill:  1  . amoxicillin (AMOXIL) 500 MG capsule    Sig: Take 4 capsules (2,000 mg total) by mouth once for 1 dose. 30-60 minutes  prior to dental procedures.    Dispense:  4 capsule    Refill:  1    Signed, [name], PA-C  02/14/2021 5:08 PM    [facility] [address], [place], [place]  [address] Phone: [phone]; Fax: [phone]

## 2021-02-14 ENCOUNTER — Ambulatory Visit: Payer: PPO | Admitting: Physician Assistant

## 2021-02-14 ENCOUNTER — Other Ambulatory Visit: Payer: Self-pay

## 2021-02-14 ENCOUNTER — Encounter: Payer: Self-pay | Admitting: Physician Assistant

## 2021-02-14 ENCOUNTER — Ambulatory Visit (INDEPENDENT_AMBULATORY_CARE_PROVIDER_SITE_OTHER): Payer: PPO | Admitting: Pharmacist

## 2021-02-14 VITALS — BP 108/60 | HR 90 | Ht 70.0 in | Wt 176.6 lb

## 2021-02-14 DIAGNOSIS — Z5181 Encounter for therapeutic drug level monitoring: Secondary | ICD-10-CM | POA: Diagnosis not present

## 2021-02-14 DIAGNOSIS — Z9889 Other specified postprocedural states: Secondary | ICD-10-CM

## 2021-02-14 DIAGNOSIS — I340 Nonrheumatic mitral (valve) insufficiency: Secondary | ICD-10-CM

## 2021-02-14 DIAGNOSIS — R338 Other retention of urine: Secondary | ICD-10-CM | POA: Diagnosis not present

## 2021-02-14 LAB — POCT INR: INR: 1.7 — AB (ref 2.0–3.0)

## 2021-02-14 MED ORDER — AMOXICILLIN 500 MG PO CAPS: 2000.0000 mg | ORAL_CAPSULE | Freq: Once | ORAL | 1 refills | Status: AC

## 2021-02-14 MED ORDER — AMOXICILLIN 500 MG PO CAPS: 2000.0000 mg | ORAL_CAPSULE | Freq: Once | ORAL | 1 refills | Status: DC

## 2021-02-14 NOTE — Patient Instructions (Signed)
Medication Instructions:  Your physician has recommended you make the following change in your medication:   1.  Take Amoxacillin 4 tablets ( 2,000 mg) by mouth 30-60 minutes prior to dental procedures.   *If you need a refill on your cardiac medications before your next appointment, please call your pharmacy*   Lab Work: -None  If you have labs (blood work) drawn today and your tests are completely normal, you will receive your results only by: Marland Kitchen MyChart Message (if you have MyChart) OR . A paper copy in the mail If you have any lab test that is abnormal or we need to change your treatment, we will call you to review the results.   Testing/Procedures: Keep Echo appointment on Thursday, May 5 @ 10:30 am  Follow-Up: At [facility], you and your health needs are our priority.  As part of our continuing mission to provide you with exceptional heart care, we have created designated Provider Care Teams.  These Care Teams include your primary Cardiologist (physician) and Advanced Practice Providers (APPs -  Physician Assistants and Nurse Practitioners) who all work together to provide you with the care you need, when you need it.  We recommend signing up for the patient portal called "MyChart".  Sign up information is provided on this After Visit Summary.  MyChart is used to connect with patients for Virtual Visits (Telemedicine).  Patients are able to view lab/test results, encounter notes, upcoming appointments, etc.  Non-urgent messages can be sent to your provider as well.   To learn more about what you can do with MyChart, go to [email or web address].    Your next appointment:   3 month(s) on Friday, August 5 @ 10:00 am.   The format for your next appointment:   In Person  Provider:   Candee Furbish, MD   Other Instructions Check bp for 1-2 weeks send in reading to mychart.

## 2021-02-14 NOTE — Patient Instructions (Signed)
Take 1.5 tablets of warfarin today and then continue taking warfarin 1 tablet daily. Recheck INR in 1 week. Coumadin Clinic [phone].

## 2021-02-15 DIAGNOSIS — R509 Fever, unspecified: Secondary | ICD-10-CM | POA: Diagnosis not present

## 2021-02-15 DIAGNOSIS — R112 Nausea with vomiting, unspecified: Secondary | ICD-10-CM | POA: Diagnosis not present

## 2021-02-15 DIAGNOSIS — Z03818 Encounter for observation for suspected exposure to other biological agents ruled out: Secondary | ICD-10-CM | POA: Diagnosis not present

## 2021-02-16 ENCOUNTER — Other Ambulatory Visit: Payer: Self-pay | Admitting: Thoracic Surgery (Cardiothoracic Vascular Surgery)

## 2021-02-16 ENCOUNTER — Telehealth: Payer: Self-pay | Admitting: Cardiology

## 2021-02-16 DIAGNOSIS — Z9889 Other specified postprocedural states: Secondary | ICD-10-CM

## 2021-02-16 NOTE — Telephone Encounter (Signed)
Pt states 02/11/21 he had food poisoning with symptoms of of shaking, diahrea  And vomiting, currently pt only has diahrea, pt has been drinking Pedialyte since Tuesday of this week. Pt states he still has a foley since his last surgery. Pt states this has nothing to do with his heart. Pt states he was seen by [name] this week and checked out ok.Pt states he would like to bring his meds in and have someone look through them to make sure he's taking them correctly. Please advise

## 2021-02-16 NOTE — Telephone Encounter (Signed)
Discussed call with [name]. Pt aware he needs to discuss concerns further w/ PCP and/or urology. Rule out possible GI infection vs catheter r/t infection. Pt and son both agreeable to plan and appreciate the return call.

## 2021-02-20 ENCOUNTER — Ambulatory Visit
Admission: RE | Admit: 2021-02-20 | Discharge: 2021-02-20 | Disposition: A | Discharge status: Home / Self Care | Payer: PPO | Source: Ambulatory Visit | Attending: Thoracic Surgery (Cardiothoracic Vascular Surgery) | Admitting: Thoracic Surgery (Cardiothoracic Vascular Surgery)

## 2021-02-20 ENCOUNTER — Other Ambulatory Visit: Payer: Self-pay

## 2021-02-20 ENCOUNTER — Ambulatory Visit (INDEPENDENT_AMBULATORY_CARE_PROVIDER_SITE_OTHER): Payer: Self-pay | Admitting: Physician Assistant

## 2021-02-20 ENCOUNTER — Telehealth: Payer: Self-pay | Admitting: Cardiology

## 2021-02-20 VITALS — BP 137/72 | HR 89 | Temp 98.4°F | Ht 70.0 in | Wt 183.4 lb

## 2021-02-20 DIAGNOSIS — Z9889 Other specified postprocedural states: Secondary | ICD-10-CM

## 2021-02-20 DIAGNOSIS — J90 Pleural effusion, not elsewhere classified: Secondary | ICD-10-CM | POA: Diagnosis not present

## 2021-02-20 DIAGNOSIS — R339 Retention of urine, unspecified: Secondary | ICD-10-CM

## 2021-02-20 DIAGNOSIS — J9811 Atelectasis: Secondary | ICD-10-CM | POA: Diagnosis not present

## 2021-02-20 DIAGNOSIS — Z952 Presence of prosthetic heart valve: Secondary | ICD-10-CM | POA: Diagnosis not present

## 2021-02-20 MED ORDER — POTASSIUM CHLORIDE CRYS ER 10 MEQ PO TBCR: 10.0000 meq | EXTENDED_RELEASE_TABLET | Freq: Every day | ORAL | 0 refills | Status: DC

## 2021-02-20 MED ORDER — FUROSEMIDE 20 MG PO TABS: 20.0000 mg | ORAL_TABLET | Freq: Every day | ORAL | 0 refills | Status: DC

## 2021-02-20 NOTE — Progress Notes (Signed)
HPI:  Patient returns for routine postoperative follow-up having undergone complex mitral valve repair requiring quadrangular resection of the P2 segment of posterior leaflet in question.  The patient's early postoperative recovery while in the hospital was notable for urinary retention and hematuria.  His Foley catheter was replaced after initial removal a few days postoperatively and he was noted to have gross hematuria.  This cleared over the next 2 days. The Foley catheter was again removed and he was able to void clear urine twice prior to his discharge on that same day.  He reports that he develops urinary retention few days later and was seen by his urologist.  Foley catheter was replaced and he is continue to Foley catheter for the past few weeks.  He has scheduled follow-up with his urologist on 02/22/2021. Overall, he feels that he is progressing and has been gaining some strength but over the past few days has noticed that he develops shortness of breath with minimal activity.  He says his appetite has returned and said he is eating regular meals.  He denies having any palpitations   Current Outpatient Medications  Medication Sig Dispense Refill  . Ascorbic Acid (VITAMIN C) 1000 MG tablet Take 1,000 mg by mouth daily.    Marland Kitchen aspirin EC 81 MG tablet Take 81 mg by mouth daily. Swallow whole.    . Calcium Carb-Cholecalciferol (CALCIUM 600/VITAMIN D3) 600-800 MG-UNIT TABS Take 2 tablets by mouth daily.    . Magnesium Oxide 250 MG TABS Take 250 mg by mouth daily.    . metoprolol tartrate (LOPRESSOR) 25 MG tablet Take 0.5 tablets (12.5 mg total) by mouth 2 (two) times daily. 30 tablet 2  . Multiple Vitamins-Minerals (MULTIVITAMIN WITH MINERALS) tablet Take 1 tablet by mouth daily.    . tamsulosin (FLOMAX) 0.4 MG CAPS capsule Take 1 capsule (0.4 mg total) by mouth daily. 30 capsule 2  . warfarin (COUMADIN) 5 MG tablet Take 1 tablet (5 mg total) by mouth daily at 4 PM. 30 tablet 2   No current  facility-administered medications for this visit.    Physical Exam   Vital signs:  BP 137/72    Pulse 89 Temp 98.4 Fahrenheit SPO2 95%  Heart: Regular rate and rhythm with an occasional premature beat.  There is no murmur.  Chest: Breath sounds are clear to auscultation.  Chest x-ray demonstrates a small bilateral pleural effusions that are new since his discharge from the hospital.  Ext's: There is 1-2+ pretibial pitting edema bilaterally.  Wounds: There is expected bruising around the chest incisions and drainage insertion site.  Incision cells are healing without any evidence of complication.   Diagnostic Tests:  CLINICAL DATA:  History of mitral valve replacement 01/24/2021.  EXAM: CHEST - 2 VIEW  COMPARISON:  PA and lateral chest 01/28/2021.  FINDINGS: Prosthetic mitral valve is identified. The patient has small bilateral pleural effusions and basilar atelectasis. No evidence of pulmonary edema. No consolidative process or pneumothorax. No acute or focal bony abnormality.  IMPRESSION: Small bilateral pleural effusions and basilar atelectasis are new since the prior exam.   Electronically Signed   By: Inge Rise M.D.   On: 02/20/2021 12:41   Impression / Plan:  Thomas [name] is  approximately 4 weeks status post complex mitral valve repair for severe mitral insufficiency.  Overall, he is progressing slowly and he complains that he gets short of breath easily with activity.  His weight today is about 13 lbs above his weight at discharge  from the hospital and he does have a some peripheral edema on exam.  His chest x-ray shows small bilateral pleural effusions that are also new since his discharge.  He completed a short course of Lasix following discharge.  We will resume Lasix 20 mg p.o. daily along with some supplemental potassium. His last INR was 1.7 and he is scheduled for repeat test tomorrow.  He is also scheduled to see his urologist in a couple  days and he is hoping to get the Foley catheter removed at that time.  He will have routine post mitral repair on 03/09/2021 and follow-up with Dr. Roxy Manns on 03/13/2021.  Plan to repeat his chest x-ray at that time.    Thomas Odea, PA-C [facility] [phone]

## 2021-02-20 NOTE — Telephone Encounter (Signed)
Spoke to the patient about food and drink to replace electrolytes. Advised to check with Dr. Ricard Dillon office before his appointment today and see if they can get lab work while he is there. He is in agreement and aware I will forward to Dr. [name]. Patient is aware he is out of the office today.

## 2021-02-20 NOTE — Telephone Encounter (Signed)
Patient would like to know if he can be scheduled in for outpatient care this afternoon at the hospital for an infusion. He would like to go in and get lab work to find out which salts are not there. He states had diarrhea a lot last week and does not have any electrolytes. He states it takes all of his energy every time he moves. He states he has an appointment with Dr. Roxy Manns at 1:00 pm and would like to be able to get some lab work and an infusion this afternoon.

## 2021-02-20 NOTE — Patient Instructions (Signed)
Begin Lasiix 40mg  by mouth daily and potassium chloride 29mEq daily.   Follow up Echo on 03/09/21.  Follow up with Dr. Roxy Manns on 03/13/21

## 2021-02-21 ENCOUNTER — Ambulatory Visit (INDEPENDENT_AMBULATORY_CARE_PROVIDER_SITE_OTHER): Payer: PPO | Admitting: Pharmacist

## 2021-02-21 ENCOUNTER — Other Ambulatory Visit: Payer: Self-pay | Admitting: Physician Assistant

## 2021-02-21 DIAGNOSIS — Z5181 Encounter for therapeutic drug level monitoring: Secondary | ICD-10-CM | POA: Diagnosis not present

## 2021-02-21 DIAGNOSIS — I340 Nonrheumatic mitral (valve) insufficiency: Secondary | ICD-10-CM | POA: Diagnosis not present

## 2021-02-21 DIAGNOSIS — I341 Nonrheumatic mitral (valve) prolapse: Secondary | ICD-10-CM | POA: Diagnosis not present

## 2021-02-21 DIAGNOSIS — Z9889 Other specified postprocedural states: Secondary | ICD-10-CM

## 2021-02-21 LAB — POCT INR: INR: 1.9 — AB (ref 2.0–3.0)

## 2021-02-21 NOTE — Patient Instructions (Signed)
Description   Take 1 tablet daily except for 1.5 tablets on Tuesdays and Fridays. Recheck INR in 1 week. Coumadin Clinic [phone].

## 2021-02-22 ENCOUNTER — Other Ambulatory Visit (HOSPITAL_COMMUNITY): Payer: Self-pay

## 2021-02-22 ENCOUNTER — Other Ambulatory Visit: Payer: Self-pay | Admitting: Cardiology

## 2021-02-22 DIAGNOSIS — R338 Other retention of urine: Secondary | ICD-10-CM | POA: Diagnosis not present

## 2021-02-22 MED ORDER — WARFARIN SODIUM 5 MG PO TABS: ORAL_TABLET | ORAL | 0 refills | Status: DC

## 2021-02-22 MED ORDER — METOPROLOL TARTRATE 25 MG PO TABS: 12.5000 mg | ORAL_TABLET | Freq: Two times a day (BID) | ORAL | 3 refills | Status: DC

## 2021-02-22 NOTE — Telephone Encounter (Signed)
Agree with plan Also should reach out to PCP as well.  Candee Furbish, MD

## 2021-02-22 NOTE — Telephone Encounter (Signed)
*  STAT* If patient is at the pharmacy, call can be transferred to refill team.   1. Which medications need to be refilled? (please list name of each medication and dose if known) new prescriptions for Metoprolol, Warfarin. Flomax Tamsulosin  2. Which pharmacy/location (including street and city if local pharmacy) is medication to be sent to? [facility] [address] [place]  3. Do they need a 30 day or 90 day supply? 90 days and refills

## 2021-02-22 NOTE — Telephone Encounter (Signed)
Prescription refill request received for warfarin Lov: 02/14/2021, [place] Next INR check: 4/26 Warfarin tablet strength: 5mg 

## 2021-02-28 ENCOUNTER — Other Ambulatory Visit: Payer: Self-pay

## 2021-02-28 ENCOUNTER — Ambulatory Visit (INDEPENDENT_AMBULATORY_CARE_PROVIDER_SITE_OTHER): Payer: PPO

## 2021-02-28 DIAGNOSIS — Z9889 Other specified postprocedural states: Secondary | ICD-10-CM | POA: Diagnosis not present

## 2021-02-28 DIAGNOSIS — Z5181 Encounter for therapeutic drug level monitoring: Secondary | ICD-10-CM | POA: Diagnosis not present

## 2021-02-28 LAB — POCT INR: INR: 1.6 — AB (ref 2.0–3.0)

## 2021-02-28 NOTE — Patient Instructions (Signed)
Description   Take 2 tablets today, then start taking 1 tablet daily except for 1.5 tablets on Tuesdays, Thursdays and Saturdays.  Recheck INR in 1 week. Coumadin Clinic [phone].

## 2021-03-06 ENCOUNTER — Ambulatory Visit: Payer: PPO | Admitting: Podiatry

## 2021-03-06 ENCOUNTER — Other Ambulatory Visit: Payer: Self-pay

## 2021-03-06 DIAGNOSIS — M79675 Pain in left toe(s): Secondary | ICD-10-CM

## 2021-03-06 DIAGNOSIS — B351 Tinea unguium: Secondary | ICD-10-CM

## 2021-03-06 DIAGNOSIS — L603 Nail dystrophy: Secondary | ICD-10-CM | POA: Diagnosis not present

## 2021-03-06 NOTE — Progress Notes (Signed)
   HPI: 75 y.o. male presenting today for follow-up evaluation of the left hallux toenail plate.  Patient states that he sustained an injury back in 1994 and ever since the nails been thickened and dystrophic and discolored.  Patient states that over the past 6 months he has been doing very well.  He currently has no symptoms associated to the toenail.  Past Medical History:  Diagnosis Date  . Arthritis   . Bilateral inguinal hernia   . BPH (benign prostatic hyperplasia)   . Cancer [facility])    prostate cancer; per patient being followed by Dr [name] at [facility] urology ; currently no chemotherapy   . Mitral regurgitation   . Mitral valve prolapse    now seeing [name]   . Pneumonia    At age 46  . S/P minimally-invasive mitral valve repair 01/24/2021   Complex valvuloplasty including quadrangular resection of posterior leaflet with 32 mm Sorin Memo 4D ring annuloplasty via right mini thoracotomy result     Physical Exam: General: The patient is alert and oriented x3 in no acute distress.  Dermatology: Skin is warm, dry and supple bilateral lower extremities. Negative for open lesions or macerations.  Hyperkeratotic dystrophic discolored nail noted to the left hallux nail plate.  It does appear that the medial border of the nail plate is intruding to the soft tissue skin on the medial nail fold.  Negative for any erythema Vascular: Palpable pedal pulses bilaterally. No edema or erythema noted. Capillary refill within normal limits.  Neurological: Epicritic and protective threshold grossly intact bilaterally.   Musculoskeletal Exam: No pedal deformities noted Assessment: 1.  Dystrophic nail/onychomycosis of toenail left hallux with an ingrowing nail to the medial portion of the nail plate   Plan of Care:  1. Patient evaluated.  2.  Mechanical debridement of the nail was performed today using a nail nipper without incident or bleeding.  3.  Continue conservative care at the moment.  The  patient is doing very well. 4.  Return to clinic PRN.  If the patient does have any issues or symptoms associated to the nail plate we will likely need to perform a total permanent nail matricectomy however the patient is asymptomatic today      Edrick Kins, DPM [facility]  Dr. Edrick Kins, DPM    [address], [place] [address]                Office [phone]  Fax [phone]

## 2021-03-07 ENCOUNTER — Ambulatory Visit
Admission: RE | Admit: 2021-03-07 | Discharge: 2021-03-07 | Disposition: A | Discharge status: Home / Self Care | Payer: PPO | Source: Ambulatory Visit | Attending: Urology | Admitting: Urology

## 2021-03-07 DIAGNOSIS — N402 Nodular prostate without lower urinary tract symptoms: Secondary | ICD-10-CM | POA: Diagnosis not present

## 2021-03-07 DIAGNOSIS — N3289 Other specified disorders of bladder: Secondary | ICD-10-CM | POA: Diagnosis not present

## 2021-03-07 DIAGNOSIS — N400 Enlarged prostate without lower urinary tract symptoms: Secondary | ICD-10-CM | POA: Diagnosis not present

## 2021-03-07 DIAGNOSIS — C61 Malignant neoplasm of prostate: Secondary | ICD-10-CM

## 2021-03-07 MED ORDER — GADOBENATE DIMEGLUMINE 529 MG/ML IV SOLN
17.0000 mL | Freq: Once | INTRAVENOUS | Status: AC | PRN
  Administered 2021-03-07: 17 mL via INTRAVENOUS

## 2021-03-09 ENCOUNTER — Ambulatory Visit (INDEPENDENT_AMBULATORY_CARE_PROVIDER_SITE_OTHER): Payer: PPO | Admitting: *Deleted

## 2021-03-09 ENCOUNTER — Ambulatory Visit (HOSPITAL_COMMUNITY): Payer: PPO | Attending: Cardiovascular Disease

## 2021-03-09 ENCOUNTER — Other Ambulatory Visit: Payer: Self-pay

## 2021-03-09 DIAGNOSIS — I341 Nonrheumatic mitral (valve) prolapse: Secondary | ICD-10-CM | POA: Insufficient documentation

## 2021-03-09 DIAGNOSIS — I340 Nonrheumatic mitral (valve) insufficiency: Secondary | ICD-10-CM | POA: Diagnosis not present

## 2021-03-09 DIAGNOSIS — Z5181 Encounter for therapeutic drug level monitoring: Secondary | ICD-10-CM

## 2021-03-09 DIAGNOSIS — Z9889 Other specified postprocedural states: Secondary | ICD-10-CM | POA: Diagnosis not present

## 2021-03-09 LAB — ECHOCARDIOGRAM COMPLETE
Area-P 1/2: 1.56 cm2
MV VTI: 1.16 cm2
P 1/2 time: 286 msec
S' Lateral: 3.4 cm

## 2021-03-09 LAB — POCT INR: INR: 1.6 — AB (ref 2.0–3.0)

## 2021-03-09 NOTE — Patient Instructions (Signed)
Description    Take 2 tablets of Warfarin today and then start taking 1.5 tablets daily except for 1 tablet on Mondays and Wednesdays. Recheck INR in 1 week. Coumadin Clinic [phone].

## 2021-03-10 ENCOUNTER — Other Ambulatory Visit: Payer: Self-pay | Admitting: Thoracic Surgery (Cardiothoracic Vascular Surgery)

## 2021-03-10 DIAGNOSIS — Z9889 Other specified postprocedural states: Secondary | ICD-10-CM

## 2021-03-13 ENCOUNTER — Telehealth (INDEPENDENT_AMBULATORY_CARE_PROVIDER_SITE_OTHER): Payer: Self-pay | Admitting: Thoracic Surgery (Cardiothoracic Vascular Surgery)

## 2021-03-13 ENCOUNTER — Encounter: Payer: Self-pay | Admitting: Thoracic Surgery (Cardiothoracic Vascular Surgery)

## 2021-03-13 ENCOUNTER — Other Ambulatory Visit: Payer: Self-pay

## 2021-03-13 ENCOUNTER — Ambulatory Visit: Payer: PPO | Admitting: Thoracic Surgery (Cardiothoracic Vascular Surgery)

## 2021-03-13 ENCOUNTER — Ambulatory Visit
Admission: RE | Admit: 2021-03-13 | Discharge: 2021-03-13 | Disposition: A | Discharge status: Home / Self Care | Payer: PPO | Source: Ambulatory Visit | Attending: Thoracic Surgery (Cardiothoracic Vascular Surgery) | Admitting: Thoracic Surgery (Cardiothoracic Vascular Surgery)

## 2021-03-13 DIAGNOSIS — Z9889 Other specified postprocedural states: Secondary | ICD-10-CM

## 2021-03-13 DIAGNOSIS — Z952 Presence of prosthetic heart valve: Secondary | ICD-10-CM | POA: Diagnosis not present

## 2021-03-13 DIAGNOSIS — M419 Scoliosis, unspecified: Secondary | ICD-10-CM | POA: Diagnosis not present

## 2021-03-13 DIAGNOSIS — R918 Other nonspecific abnormal finding of lung field: Secondary | ICD-10-CM | POA: Diagnosis not present

## 2021-03-13 NOTE — Progress Notes (Signed)
[address]       [place], [address]             [phone]     CARDIOTHORACIC SURGERY TELEPHONE VIRTUAL OFFICE NOTE  Referring Provider is [name], MD PCP is [name], MD   HPI:  I spoke with Thomas [name] (DOB 06-27-1946 ) via telephone on 03/13/2021 at 5:02 PM and verified that I was speaking with the correct person using more than one form of identification.  We discussed the fact that I was contacting them from my office and they were located at home, as well as the reason(s) for conducting our visit virtually instead of in-person.  The patient expressed understanding the circumstances and agreed to proceed as described.   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  His postoperative recovery has been uneventful and he was last seen in our office on February 20, 2021 at which time he was doing well.  He underwent routine follow-up echocardiogram on Mar 09, 2021 and I spoke with him on the telephone today to see how he is doing.  He reports that he is doing exceptionally well.  He states that he is essentially back to his preoperative baseline.  He walked 2 miles with his dog today and reports no shortness of breath or limitation at all.  He no longer has any pain in his chest, just a little bit of numbness along the incision.  Appetite is good and overall he feels quite well.  He still has a Foley catheter in place related to his problems with bladder outlet obstruction.  It is scheduled to be removed at his urology office tomorrow.   Current Outpatient Medications  Medication Sig Dispense Refill  . Ascorbic Acid (VITAMIN C) 1000 MG tablet Take 1,000 mg by mouth daily.    Marland Kitchen aspirin EC 81 MG tablet Take 81 mg by mouth daily. Swallow whole.    . Calcium  Carb-Cholecalciferol (CALCIUM 600/VITAMIN D3) 600-800 MG-UNIT TABS Take 2 tablets by mouth daily.    . furosemide (LASIX) 20 MG tablet Take 1 tablet (20 mg total) by mouth daily. 21 tablet 0  . Magnesium Oxide 250 MG TABS Take 250 mg by mouth daily.    . metoprolol tartrate (LOPRESSOR) 25 MG tablet Take 0.5 tablets (12.5 mg total) by mouth 2 (two) times daily. 90 tablet 3  . Multiple Vitamins-Minerals (MULTIVITAMIN WITH MINERALS) tablet Take 1 tablet by mouth daily.    . potassium chloride (KLOR-CON) 10 MEQ tablet Take 1 tablet (10 mEq total) by mouth daily. 21 tablet 0  . tamsulosin (FLOMAX) 0.4 MG CAPS capsule Take 1 capsule (0.4 mg total) by mouth daily. 30 capsule 2  . warfarin (COUMADIN) 5 MG tablet Take 1 tablet to 1 and 1/2 tablets by mouth daily as directed by the coumadin clinic. 120 tablet 0   No current facility-administered medications for this visit.     Diagnostic Tests:   ECHOCARDIOGRAM REPORT       Patient Name:  Thomas [name] Date of Exam: 03/09/2021  Medical Rec #: [id number]     Height:    70.0 in  Accession #:  [id number]    Weight:    183.4 lb  Date of Birth: December 01, 1945     BSA:     2.012 m  Patient Age:  48 years     BP:      133/79 mmHg  Patient Gender: M         HR:      77 bpm.  Exam Location: [address]   Procedure: 2D Echo, 3D Echo, Cardiac Doppler and Color Doppler   Indications:  Z98.89 s/p MVR    History:    Patient has prior history of Echocardiogram examinations,  most         recent 10/18/2020. Mitral Valve Prolapse and Mitral         regurgitation. MINIMALLY INVASIVE MITRAL VALVE REPAIR  (MVR)         USING 4D MEMO RING SIZE 32MM.           Mitral Valve: 32 mm Memo prosthetic annuloplasty ring  valve is         present in the mitral position. Procedure Date:  01/24/2021.    Sonographer:  Marygrace Drought RCS  Referring Phys:  [id number]    1. Left ventricular ejection fraction, by estimation, is 50 to 55%. Left  ventricular ejection fraction by 3D volume is 53 %. The left ventricle has  low normal function. The left ventricle has no regional wall motion  abnormalities. Left ventricular  diastolic function could not be evaluated.  2. Right ventricular systolic function is normal. The right ventricular  size is normal. There is normal pulmonary artery systolic pressure.  3. Left atrial size was moderately dilated.  4. The mitral valve is myxomatous. No evidence of mitral valve  regurgitation. No evidence of mitral stenosis. The mean mitral valve  gradient is 3.5 mmHg with average heart rate of 75 bpm. There is a 32 mm  Memo prosthetic annuloplasty ring present in the  mitral position. Procedure Date: 01/24/2021.  5. The aortic valve is tricuspid. Aortic valve regurgitation is trivial.  Mild aortic valve sclerosis is present, with no evidence of aortic valve  stenosis.   Comparison(s): Prior images reviewed side by side. There is interval  successful MV repair with slight reduction in LV EF.   FINDINGS  Left Ventricle: Left ventricular ejection fraction, by estimation, is 50  to 55%. Left ventricular ejection fraction by 3D volume is 53 %. The left  ventricle has low normal function. The left ventricle has no regional wall  motion abnormalities. The left  ventricular internal cavity size was normal in size. There is no left  ventricular hypertrophy. Abnormal (paradoxical) septal motion consistent  with post-operative status. Left ventricular diastolic function could not  be evaluated due to mitral valve  repair. Left ventricular diastolic function could not be evaluated.   Right Ventricle: The right ventricular size is normal. No increase in  right ventricular wall thickness. Right ventricular systolic function is  normal. There is normal pulmonary artery systolic pressure.  The tricuspid  regurgitant velocity is 2.16 m/s, and  with an assumed right atrial pressure of 3 mmHg, the estimated right  ventricular systolic pressure is 65.7 mmHg.   Left Atrium: Left atrial size was moderately dilated.   Right Atrium: Right atrial size was normal in size.   Pericardium: There is no evidence of pericardial effusion.   Mitral Valve: The mitral valve is myxomatous. No evidence of mitral valve  regurgitation. There is a 32 mm Memo prosthetic annuloplasty ring present  in the mitral position. Procedure Date: 01/24/2021. No evidence of mitral  valve stenosis. MV peak  gradient, 7.5 mmHg. The  mean mitral valve gradient is 3.5 mmHg with  average heart rate of 75 bpm.   Tricuspid Valve: The tricuspid valve is normal in structure. Tricuspid  valve regurgitation is trivial.   Aortic Valve: The aortic valve is tricuspid. Aortic valve regurgitation is  trivial. Aortic regurgitation PHT measures 286 msec. Mild aortic valve  sclerosis is present, with no evidence of aortic valve stenosis.   Pulmonic Valve: The pulmonic valve was normal in structure. Pulmonic valve  regurgitation is mild.   Aorta: The aortic root is normal in size and structure.   IAS/Shunts: No atrial level shunt detected by color flow Doppler.     LEFT VENTRICLE  PLAX 2D  LVIDd:     4.60 cm     Diastology  LVIDs:     3.40 cm     LV e' medial:  4.46 cm/s  LV PW:     1.10 cm     LV E/e' medial: 25.6  LV IVS:    1.00 cm     LV e' lateral:  7.18 cm/s  LVOT diam:   1.80 cm     LV E/e' lateral: 15.9  LV SV:     54  LV SV Index:  27  LVOT Area:   2.54 cm    3D Volume EF                 LV 3D EF:  Left                       ventricular                       ejection                       fraction by                       3D volume                        is 53 %.                   3D Volume EF:                 3D EF:    53 %                 LV EDV:    92 ml                 LV ESV:    43 ml                 LV SV:    49 ml   RIGHT VENTRICLE  RV Basal diam: 4.20 cm  RV S prime:   10.80 cm/s  RVSP:      21.7 mmHg   LEFT ATRIUM       Index    RIGHT ATRIUM      Index  LA diam:    4.90 cm 2.44 cm/m RA Pressure: 3.00 mmHg  LA Vol (A2C):  60.0 ml 29.82 ml/m RA Area:   15.20 cm  LA Vol (A4C):  61.6 ml 30.62 ml/m RA Volume:  41.30 ml 20.53 ml/m  LA Biplane Vol: 62.9 ml 31.26 ml/m  AORTIC VALVE  LVOT Vmax:  101.00 cm/s  LVOT Vmean: 66.800 cm/s  LVOT VTI:  0.211  m  AI PHT:   286 msec    AORTA  Ao Root diam: 3.10 cm  Ao Asc diam: 3.60 cm   MITRAL VALVE        TRICUSPID VALVE  MV Area (PHT): 1.56 cm   TR Peak grad:  18.7 mmHg  MV Area VTI:  1.16 cm   TR Vmax:    216.00 cm/s  MV Peak grad: 7.5 mmHg   Estimated RAP: 3.00 mmHg  MV Mean grad: 3.5 mmHg   RVSP:      21.7 mmHg  MV Vmax:    1.37 m/s  MV Vmean:   88.4 cm/s  SHUNTS  MV Decel Time: 485 msec   Systemic VTI: 0.21 m  MV E velocity: 114.00 cm/s Systemic Diam: 1.80 cm  MV A velocity: 108.00 cm/s  MV E/A ratio: 1.06   Mihai Croitoru MD  Electronically signed by Sanda Klein MD  Signature Date/Time: 03/09/2021/11:37:28 AM      CHEST - 2 VIEW  COMPARISON:  February 20, 2021  FINDINGS: Lungs are borderline hyperexpanded but clear. Heart size and pulmonary vascularity are normal. Patient is status post mitral valve replacement. No adenopathy. There is mid to lower thoracic levoscoliosis.  IMPRESSION: Lungs borderline hyperexpanded but clear. Heart size normal. Status post mitral valve replacement.   Electronically Signed   By: Lowella Grip [name]  M.D.   On: 03/13/2021 09:08   Impression:  Patient is doing very well approximately 6 weeks status post minimally invasive mitral valve repair.  I personally reviewed the patient's recent follow-up transthoracic echocardiogram as well as his chest x-ray performed earlier today.  Echocardiogram reveals normal left ventricular systolic function with intact mitral valve repair and no residual mitral regurgitation.  Follow-up chest x-ray reveals clear lung fields bilaterally with no pleural effusions.    Plan:  Patient is doing very well approximately 6 weeks following mitral valve repair.  I have instructed the patient that he may resume unrestricted physical activity.  I have suggested that he gradually increase his exercise in a stepwise fashion.  I would recommend continuing anticoagulation using Coumadin for approximately 6 weeks, after which time it could be stopped.  We have not recommended any other changes to his medications.  The patient has been reminded regarding the importance of dental hygiene and the lifelong need for antibiotic prophylaxis for all dental cleanings and other related invasive procedures.  I have reminded the patient to follow-up with his urologist tomorrow and have the Foley catheter removed.  I explained concerns regarding the potential for any infection related to long-term indwelling catheter and/or problems with bladder outlet obstruction given the fact that his valve has been repaired.  All of his questions been addressed.  The patient will call return to our office in the future only should specific problems or questions arise.       Valentina Gu. Roxy Manns, MD 03/13/2021 5:02 PM

## 2021-03-13 NOTE — Patient Instructions (Addendum)
You may resume unrestricted physical activity without any particular limitations at this time.  Continue all previous medications without any changes at this time  You should be able to stop taking Coumadin (warfarin) in approximately 6 weeks.  Please discuss with [name]  Endocarditis is a potentially serious infection of heart valves or inside lining of the heart.  It occurs more commonly in patients with diseased heart valves (such as patient's with aortic or mitral valve disease) and in patients who have undergone heart valve repair or replacement.  Certain surgical and dental procedures may put you at risk, such as dental cleaning, other dental procedures, or any surgery involving the respiratory, urinary, gastrointestinal tract, gallbladder or prostate gland.   To minimize your chances for develooping endocarditis, maintain good oral health and seek prompt medical attention for any infections involving the mouth, teeth, gums, skin or urinary tract.    Always notify your doctor or dentist about your underlying heart valve condition before having any invasive procedures. You will need to take antibiotics before certain procedures, including all routine dental cleanings or other dental procedures.  Your cardiologist or dentist should prescribe these antibiotics for you to be taken ahead of time.

## 2021-03-14 DIAGNOSIS — R338 Other retention of urine: Secondary | ICD-10-CM | POA: Diagnosis not present

## 2021-03-15 DIAGNOSIS — R338 Other retention of urine: Secondary | ICD-10-CM | POA: Diagnosis not present

## 2021-03-16 ENCOUNTER — Other Ambulatory Visit: Payer: Self-pay

## 2021-03-16 ENCOUNTER — Ambulatory Visit (INDEPENDENT_AMBULATORY_CARE_PROVIDER_SITE_OTHER): Payer: PPO

## 2021-03-16 DIAGNOSIS — Z9889 Other specified postprocedural states: Secondary | ICD-10-CM | POA: Diagnosis not present

## 2021-03-16 DIAGNOSIS — Z5181 Encounter for therapeutic drug level monitoring: Secondary | ICD-10-CM | POA: Diagnosis not present

## 2021-03-16 LAB — POCT INR: INR: 1.6 — AB (ref 2.0–3.0)

## 2021-03-16 NOTE — Patient Instructions (Signed)
Description   Take 2 tablets of Warfarin today and then start taking 1.5 tablets daily. Recheck INR in 1 week. [facility] [phone].

## 2021-03-23 ENCOUNTER — Other Ambulatory Visit: Payer: Self-pay

## 2021-03-23 ENCOUNTER — Ambulatory Visit (INDEPENDENT_AMBULATORY_CARE_PROVIDER_SITE_OTHER): Payer: PPO | Admitting: *Deleted

## 2021-03-23 DIAGNOSIS — Z9889 Other specified postprocedural states: Secondary | ICD-10-CM

## 2021-03-23 DIAGNOSIS — Z5181 Encounter for therapeutic drug level monitoring: Secondary | ICD-10-CM | POA: Diagnosis not present

## 2021-03-23 LAB — POCT INR: INR: 1.8 — AB (ref 2.0–3.0)

## 2021-03-23 NOTE — Patient Instructions (Signed)
Description   Take 2 tablets of Warfarin today and then start taking 1.5 tablets daily except 2 tablets on Mondays. Recheck INR in 1 week. [facility] [phone].

## 2021-03-30 ENCOUNTER — Ambulatory Visit (INDEPENDENT_AMBULATORY_CARE_PROVIDER_SITE_OTHER): Payer: PPO

## 2021-03-30 ENCOUNTER — Other Ambulatory Visit: Payer: Self-pay

## 2021-03-30 DIAGNOSIS — Z5181 Encounter for therapeutic drug level monitoring: Secondary | ICD-10-CM | POA: Diagnosis not present

## 2021-03-30 DIAGNOSIS — Z9889 Other specified postprocedural states: Secondary | ICD-10-CM

## 2021-03-30 LAB — POCT INR: INR: 2.1 (ref 2.0–3.0)

## 2021-03-30 NOTE — Patient Instructions (Signed)
-   continue taking 1.5 tablets daily except 2 tablets on Mondays.  - recheck INR in 2 weeks [facility] [phone].

## 2021-03-31 ENCOUNTER — Telehealth: Payer: Self-pay | Admitting: Cardiology

## 2021-03-31 DIAGNOSIS — R3914 Feeling of incomplete bladder emptying: Secondary | ICD-10-CM | POA: Diagnosis not present

## 2021-03-31 DIAGNOSIS — R350 Frequency of micturition: Secondary | ICD-10-CM | POA: Diagnosis not present

## 2021-03-31 DIAGNOSIS — R300 Dysuria: Secondary | ICD-10-CM | POA: Diagnosis not present

## 2021-03-31 DIAGNOSIS — R3916 Straining to void: Secondary | ICD-10-CM | POA: Diagnosis not present

## 2021-03-31 DIAGNOSIS — R3915 Urgency of urination: Secondary | ICD-10-CM | POA: Diagnosis not present

## 2021-03-31 DIAGNOSIS — N3000 Acute cystitis without hematuria: Secondary | ICD-10-CM | POA: Diagnosis not present

## 2021-03-31 NOTE — Telephone Encounter (Signed)
cefpodoxamine is ok, should not interact with his warfarin

## 2021-03-31 NOTE — Telephone Encounter (Signed)
Pt c/o medication issue:  1. Name of Medication: cefpodoxime 200 mg   2. How are you currently taking this medication (dosage and times per day)? Patient has not started this medication  3. Are you having a reaction (difficulty breathing--STAT)? No   4. What is your medication issue?  Patient states this morning he saw his urologist and he discovered he has a bladder infection. He states the provider he saw prescribed cefpodoxime 200 mg 2x daily for 1 week. Before starting this medication he would like to make sure Dr. [name] agrees.

## 2021-03-31 NOTE — Telephone Encounter (Signed)
Patient informed ok to take antibiotic.  He is grateful for assistance.

## 2021-03-31 NOTE — Telephone Encounter (Signed)
Pt on warfarin with next INR appointment 04/13/21. Will route to PharmD for any recommendations.

## 2021-04-06 ENCOUNTER — Telehealth (HOSPITAL_COMMUNITY): Payer: Self-pay

## 2021-04-10 ENCOUNTER — Telehealth (HOSPITAL_COMMUNITY): Payer: Self-pay | Admitting: *Deleted

## 2021-04-11 ENCOUNTER — Ambulatory Visit (HOSPITAL_COMMUNITY): Payer: PPO

## 2021-04-13 ENCOUNTER — Other Ambulatory Visit: Payer: Self-pay

## 2021-04-13 ENCOUNTER — Ambulatory Visit (INDEPENDENT_AMBULATORY_CARE_PROVIDER_SITE_OTHER): Payer: PPO | Admitting: *Deleted

## 2021-04-13 DIAGNOSIS — Z9889 Other specified postprocedural states: Secondary | ICD-10-CM | POA: Diagnosis not present

## 2021-04-13 DIAGNOSIS — Z5181 Encounter for therapeutic drug level monitoring: Secondary | ICD-10-CM

## 2021-04-13 LAB — POCT INR: INR: 3.1 — AB (ref 2.0–3.0)

## 2021-04-13 NOTE — Patient Instructions (Signed)
Description   Today take 1 tablet then continue taking 1.5 tablets daily except 2 tablets on Mondays. Recheck INR in 2 weeks. [facility] [phone].

## 2021-04-17 ENCOUNTER — Ambulatory Visit (HOSPITAL_COMMUNITY): Payer: PPO

## 2021-04-18 DIAGNOSIS — C61 Malignant neoplasm of prostate: Secondary | ICD-10-CM | POA: Diagnosis not present

## 2021-04-19 ENCOUNTER — Ambulatory Visit (HOSPITAL_COMMUNITY): Payer: PPO

## 2021-04-21 ENCOUNTER — Ambulatory Visit (HOSPITAL_COMMUNITY): Payer: PPO

## 2021-04-24 ENCOUNTER — Ambulatory Visit (HOSPITAL_COMMUNITY): Payer: PPO

## 2021-04-26 ENCOUNTER — Ambulatory Visit (HOSPITAL_COMMUNITY): Payer: PPO

## 2021-04-26 DIAGNOSIS — N401 Enlarged prostate with lower urinary tract symptoms: Secondary | ICD-10-CM | POA: Diagnosis not present

## 2021-04-26 DIAGNOSIS — C61 Malignant neoplasm of prostate: Secondary | ICD-10-CM | POA: Diagnosis not present

## 2021-04-26 DIAGNOSIS — R3914 Feeling of incomplete bladder emptying: Secondary | ICD-10-CM | POA: Diagnosis not present

## 2021-04-26 DIAGNOSIS — R8271 Bacteriuria: Secondary | ICD-10-CM | POA: Diagnosis not present

## 2021-04-27 ENCOUNTER — Ambulatory Visit (INDEPENDENT_AMBULATORY_CARE_PROVIDER_SITE_OTHER): Payer: PPO | Admitting: *Deleted

## 2021-04-27 ENCOUNTER — Other Ambulatory Visit: Payer: Self-pay

## 2021-04-27 ENCOUNTER — Telehealth: Payer: Self-pay | Admitting: Cardiology

## 2021-04-27 DIAGNOSIS — Z5181 Encounter for therapeutic drug level monitoring: Secondary | ICD-10-CM

## 2021-04-27 DIAGNOSIS — Z9889 Other specified postprocedural states: Secondary | ICD-10-CM | POA: Diagnosis not present

## 2021-04-27 LAB — POCT INR: INR: 1.7 — AB (ref 2.0–3.0)

## 2021-04-27 NOTE — Patient Instructions (Signed)
Description   Today take 2 tablets then continue taking 1.5 tablets daily except 2 tablets on Mondays. Recheck INR in 2 weeks. [facility] [phone].

## 2021-04-27 NOTE — Telephone Encounter (Signed)
    Pt c/o medication issue:  1. Name of Medication: warfarin (COUMADIN) 5 MG tablet  2. How are you currently taking this medication (dosage and times per day)? Take 1 tablet to 1 and 1/2 tablets by mouth daily as directed by the coumadin clinic.  3. Are you having a reaction (difficulty breathing--STAT)?   4. What is your medication issue? Pt would like to ask Dr. [name], how much longer he have to be on coumadin. He said [name] urologist would like to know as well

## 2021-04-27 NOTE — Telephone Encounter (Signed)
You should be able to stop taking Coumadin (warfarin) in approximately 6 weeks.  Please discuss with [name] - from 03/13/2021 Dr Roxy Manns televisit note.  Pt is aware of the above and that [name] is not in to answer at this time.  Pt reports he is not in a rush at all. Mainly his urologist needs to know when he can stop the warfarin.  Advised I will forward this to [name] for review and instructions and will c/b as soon as possible.

## 2021-04-28 ENCOUNTER — Ambulatory Visit (HOSPITAL_COMMUNITY): Payer: PPO

## 2021-05-01 ENCOUNTER — Ambulatory Visit (HOSPITAL_COMMUNITY): Payer: PPO

## 2021-05-03 ENCOUNTER — Ambulatory Visit (HOSPITAL_COMMUNITY): Payer: PPO

## 2021-05-05 ENCOUNTER — Ambulatory Visit (HOSPITAL_COMMUNITY): Payer: PPO

## 2021-05-10 ENCOUNTER — Ambulatory Visit (HOSPITAL_COMMUNITY): Payer: PPO

## 2021-05-11 ENCOUNTER — Other Ambulatory Visit: Payer: Self-pay

## 2021-05-11 ENCOUNTER — Ambulatory Visit (INDEPENDENT_AMBULATORY_CARE_PROVIDER_SITE_OTHER): Payer: PPO | Admitting: *Deleted

## 2021-05-11 DIAGNOSIS — Z9889 Other specified postprocedural states: Secondary | ICD-10-CM

## 2021-05-11 DIAGNOSIS — Z5181 Encounter for therapeutic drug level monitoring: Secondary | ICD-10-CM | POA: Diagnosis not present

## 2021-05-11 LAB — POCT INR: INR: 1.9 — AB (ref 2.0–3.0)

## 2021-05-11 NOTE — Telephone Encounter (Signed)
Pt is aware as he has been seen in the [facility] this AM.  Instructions were given to him then.

## 2021-05-11 NOTE — Patient Instructions (Signed)
Description   Warfarin d/c'd today at visit per note from Dr. [name]. Pt verbalized understanding to STOP warfarin today. Removed from med list and call pharmacy to let them know.

## 2021-05-12 ENCOUNTER — Ambulatory Visit (HOSPITAL_COMMUNITY): Payer: PPO

## 2021-05-15 ENCOUNTER — Ambulatory Visit (HOSPITAL_COMMUNITY): Payer: PPO

## 2021-05-17 ENCOUNTER — Ambulatory Visit (HOSPITAL_COMMUNITY): Payer: PPO

## 2021-05-19 ENCOUNTER — Ambulatory Visit (HOSPITAL_COMMUNITY): Payer: PPO

## 2021-05-22 ENCOUNTER — Ambulatory Visit (HOSPITAL_COMMUNITY): Payer: PPO

## 2021-05-24 ENCOUNTER — Ambulatory Visit (HOSPITAL_COMMUNITY): Payer: PPO

## 2021-05-26 ENCOUNTER — Ambulatory Visit (HOSPITAL_COMMUNITY): Payer: PPO

## 2021-05-26 DIAGNOSIS — C61 Malignant neoplasm of prostate: Secondary | ICD-10-CM | POA: Diagnosis not present

## 2021-05-26 DIAGNOSIS — R8271 Bacteriuria: Secondary | ICD-10-CM | POA: Diagnosis not present

## 2021-05-29 ENCOUNTER — Ambulatory Visit (HOSPITAL_COMMUNITY): Payer: PPO

## 2021-05-31 ENCOUNTER — Ambulatory Visit (HOSPITAL_COMMUNITY): Payer: PPO

## 2021-06-02 ENCOUNTER — Ambulatory Visit (HOSPITAL_COMMUNITY): Payer: PPO

## 2021-06-05 ENCOUNTER — Ambulatory Visit (HOSPITAL_COMMUNITY): Payer: PPO

## 2021-06-07 ENCOUNTER — Ambulatory Visit (HOSPITAL_COMMUNITY): Payer: PPO

## 2021-06-09 ENCOUNTER — Other Ambulatory Visit: Payer: Self-pay

## 2021-06-09 ENCOUNTER — Encounter: Payer: Self-pay | Admitting: Cardiology

## 2021-06-09 ENCOUNTER — Ambulatory Visit: Payer: PPO | Admitting: Cardiology

## 2021-06-09 ENCOUNTER — Ambulatory Visit (HOSPITAL_COMMUNITY): Payer: PPO

## 2021-06-09 VITALS — BP 110/68 | HR 74 | Ht 70.0 in | Wt 177.4 lb

## 2021-06-09 DIAGNOSIS — N138 Other obstructive and reflux uropathy: Secondary | ICD-10-CM | POA: Diagnosis not present

## 2021-06-09 DIAGNOSIS — N401 Enlarged prostate with lower urinary tract symptoms: Secondary | ICD-10-CM | POA: Diagnosis not present

## 2021-06-09 DIAGNOSIS — Z9889 Other specified postprocedural states: Secondary | ICD-10-CM

## 2021-06-09 NOTE — Patient Instructions (Signed)
Medication Instructions:  The current medical regimen is effective;  continue present plan and medications.  *If you need a refill on your cardiac medications before your next appointment, please call your pharmacy*  Follow-Up: At [facility], you and your health needs are our priority.  As part of our continuing mission to provide you with exceptional heart care, we have created designated Provider Care Teams.  These Care Teams include your primary Cardiologist (physician) and Advanced Practice Providers (APPs -  Physician Assistants and Nurse Practitioners) who all work together to provide you with the care you need, when you need it.  We recommend signing up for the patient portal called "MyChart".  Sign up information is provided on this After Visit Summary.  MyChart is used to connect with patients for Virtual Visits (Telemedicine).  Patients are able to view lab/test results, encounter notes, upcoming appointments, etc.  Non-urgent messages can be sent to your provider as well.   To learn more about what you can do with MyChart, go to [email or web address].    Your next appointment:   1 year(s)  The format for your next appointment:   In Person  Provider:   Mark Skains, MD   Thank you for choosing [facility]!!    

## 2021-06-09 NOTE — Progress Notes (Signed)
Cardiology Office Note:    Date:  06/09/2021   ID:  Thomas [name], DOB [birth year], MRN [id number]  PCP:  [name], MD   [facility] Providers Cardiologist:  Candee Furbish, MD     Referring MD: [name], MD    History of Present Illness:    Thomas [name] is a 75 y.o. male here for follow-up of mitral valve repair minimally invasive on January 24, 2021 for mitral valve prolapse with Barlow's type myxomatous degenerative disease and severe asymptomatic primary mitral regurgitation.  Dr. Roxy Manns performed surgery.  Did well postoperatively.  Echocardiogram reviewed on 03/09/2021 looked great.  Doing well.  Walked 2 miles, [employer] features editor.  One day felt heart funny. Forgot to take one of his meds. Metoprolol.   Outlet bladder, nocturia. Does not ike flomax  [name]. BPH.   Past Medical History:  Diagnosis Date   Arthritis    Bilateral inguinal hernia    BPH (benign prostatic hyperplasia)    Cancer (HCC)    prostate cancer; per patient being followed by Dr [name] at [facility] urology ; currently no chemotherapy    Mitral regurgitation    Mitral valve prolapse    now seeing [name]    Pneumonia    At age 32   S/P minimally-invasive mitral valve repair 01/24/2021   Complex valvuloplasty including quadrangular resection of posterior leaflet with 32 mm Sorin Memo 4D ring annuloplasty via right mini thoracotomy result    Past Surgical History:  Procedure Laterality Date   BUBBLE STUDY  11/23/2020   Procedure: BUBBLE STUDY;  Surgeon: [name], MD;  [facility];  Service: Cardiovascular;;   FEMORAL HERNIA REPAIR Right 07/22/2017   Procedure: HERNIA REPAIR FEMORAL;  Surgeon: Jackolyn Confer, MD;  Location: [facility];  Service: General;  Laterality: Right;   HERNIA REPAIR     INGUINAL HERNIA REPAIR N/A 07/22/2017   Procedure: LAPAROSCOPIC BILATERAL INGUINAL HERNIA REPAIR WITH MESH;  Surgeon: Jackolyn Confer, MD;  Location: [facility];  Service: General;   Laterality: N/A;   INSERTION OF MESH Bilateral 07/22/2017   Procedure: INSERTION OF MESH;  Surgeon: Jackolyn Confer, MD;  [facility];  Service: General;  Laterality: Bilateral;   MITRAL VALVE REPAIR Right 01/24/2021   Procedure: MINIMALLY INVASIVE MITRAL VALVE REPAIR (MVR) USING 4D MEMO RING SIZE 32MM;  Surgeon: Rexene Alberts, MD;  Location: [place];  Service: Open Heart Surgery;  Laterality: Right;   RIGHT/LEFT HEART CATH AND CORONARY ANGIOGRAPHY N/A 12/23/2020   Procedure: RIGHT/LEFT HEART CATH AND CORONARY ANGIOGRAPHY;  Surgeon: Sherren Mocha, MD;  Location: [place] [facility];  Service: Cardiovascular;  Laterality: N/A;   TEE WITHOUT CARDIOVERSION N/A 11/23/2020   Procedure: TRANSESOPHAGEAL ECHOCARDIOGRAM (TEE);  Surgeon: [name], MD;  Location: [place];  Service: Cardiovascular;  Laterality: N/A;   TEE WITHOUT CARDIOVERSION N/A 01/24/2021   Procedure: TRANSESOPHAGEAL ECHOCARDIOGRAM (TEE);  Surgeon: Rexene Alberts, MD;  Location: [place];  Service: Open Heart Surgery;  Laterality: N/A;   TONSILLECTOMY AND ADENOIDECTOMY      Current Medications: No outpatient medications have been marked as taking for the 06/09/21 encounter (Office Visit) with [name], MD.     Allergies:   Patient has no known allergies.   Social History   Socioeconomic History   Marital status: Divorced    Spouse name: Not on file   Number of children: Not on file   Years of education: Not on file   Highest education level:  Not on file  Occupational History   Not on file  Tobacco Use   Smoking status: Never   Smokeless tobacco: Never  Vaping Use   Vaping Use: Never used  Substance and Sexual Activity   Alcohol use: Not Currently    Comment: seldom    Drug use: No   Sexual activity: Not on file  Other Topics Concern   Not on file  Social History Narrative   Not on file   Social Determinants of Health   Financial Resource Strain: Not on file  Food Insecurity: Not on file   Transportation Needs: Not on file  Physical Activity: Not on file  Stress: Not on file  Social Connections: Not on file     Family History: The patient's family history is not on file.  ROS:   Please see the history of present illness.     All other systems reviewed and are negative.  EKGs/Labs/Other Studies Reviewed:    The following studies were reviewed today: ECHO   1. Left ventricular ejection fraction, by estimation, is 50 to 55%. Left  ventricular ejection fraction by 3D volume is 53 %. The left ventricle has  low normal function. The left ventricle has no regional wall motion  abnormalities. Left ventricular  diastolic function could not be evaluated.   2. Right ventricular systolic function is normal. The right ventricular  size is normal. There is normal pulmonary artery systolic pressure.   3. Left atrial size was moderately dilated.   4. The mitral valve is myxomatous. No evidence of mitral valve  regurgitation. No evidence of mitral stenosis. The mean mitral valve  gradient is 3.5 mmHg with average heart rate of 75 bpm. There is a 32 mm  Memo prosthetic annuloplasty ring present in the   mitral position. Procedure Date: 01/24/2021.   5. The aortic valve is tricuspid. Aortic valve regurgitation is trivial.  Mild aortic valve sclerosis is present, with no evidence of aortic valve  stenosis.   Comparison(s): Prior images reviewed side by side. There is interval  successful MV repair with slight reduction in LV EF.   Recent Labs: 01/20/2021: ALT 23 01/25/2021: Magnesium 2.1 01/27/2021: Hemoglobin 11.4; Platelets 121 01/30/2021: BUN 24; Creatinine, Ser 0.96; Potassium 3.4; Sodium 136  Recent Lipid Panel No results found for: CHOL, TRIG, HDL, CHOLHDL, VLDL, LDLCALC, LDLDIRECT   Risk Assessment/Calculations:          Physical Exam:    VS:  BP 110/68   Pulse 74   Ht '5\' 10"'$  (1.778 m)   Wt 177 lb 6.4 oz (80.5 kg)   SpO2 97%   BMI 25.45 kg/m     Wt  Readings from Last 3 Encounters:  06/09/21 177 lb 6.4 oz (80.5 kg)  02/20/21 183 lb 6.4 oz (83.2 kg)  02/14/21 176 lb 9.6 oz (80.1 kg)     GEN:  Well nourished, well developed in no acute distress HEENT: Normal NECK: No JVD; No carotid bruits LYMPHATICS: No lymphadenopathy CARDIAC: RRR, no murmurs, rubs, gallops RESPIRATORY:  Clear to auscultation without rales, wheezing or rhonchi  ABDOMEN: Soft, non-tender, non-distended MUSCULOSKELETAL:  No edema; No deformity  SKIN: Warm and dry NEUROLOGIC:  Alert and oriented x 3 PSYCHIATRIC:  Normal affect   ASSESSMENT:    1. Benign prostatic hyperplasia with urinary obstruction   2. S/P mitral valve repair    PLAN:    In order of problems listed above:  Mitral valve repair - Dr. Roxy Manns.  Doing well.  Echocardiogram reviewed.  Dental prophylaxis. ASA 81 for medical mgt.   BPH --[name]. OK for cysto if needed. Off coumadin.   1 year.        Medication Adjustments/Labs and Tests Ordered: Current medicines are reviewed at length with the patient today.  Concerns regarding medicines are outlined above.  No orders of the defined types were placed in this encounter.  No orders of the defined types were placed in this encounter.   Patient Instructions  Medication Instructions:  The current medical regimen is effective;  continue present plan and medications.  *If you need a refill on your cardiac medications before your next appointment, please call your pharmacy*  Follow-Up: At [facility], you and your health needs are our priority.  As part of our continuing mission to provide you with exceptional heart care, we have created designated Provider Care Teams.  These Care Teams include your primary Cardiologist (physician) and Advanced Practice Providers (APPs -  Physician Assistants and Nurse Practitioners) who all work together to provide you with the care you need, when you need it.  We recommend signing up for the patient  portal called "MyChart".  Sign up information is provided on this After Visit Summary.  MyChart is used to connect with patients for Virtual Visits (Telemedicine).  Patients are able to view lab/test results, encounter notes, upcoming appointments, etc.  Non-urgent messages can be sent to your provider as well.   To learn more about what you can do with MyChart, go to [email or web address].    Your next appointment:   1 year(s)  The format for your next appointment:   In Person  Provider:   Candee Furbish, MD   Thank you for choosing [facility]!!     Signed, Candee Furbish, MD  06/09/2021 10:50 AM    [place]

## 2021-06-26 DIAGNOSIS — R8271 Bacteriuria: Secondary | ICD-10-CM | POA: Diagnosis not present

## 2021-07-06 HISTORY — PX: PROSTATE BIOPSY: SHX241

## 2021-07-11 DIAGNOSIS — Z23 Encounter for immunization: Secondary | ICD-10-CM | POA: Diagnosis not present

## 2021-07-11 DIAGNOSIS — X32XXXD Exposure to sunlight, subsequent encounter: Secondary | ICD-10-CM | POA: Diagnosis not present

## 2021-07-11 DIAGNOSIS — L570 Actinic keratosis: Secondary | ICD-10-CM | POA: Diagnosis not present

## 2021-07-11 DIAGNOSIS — C61 Malignant neoplasm of prostate: Secondary | ICD-10-CM | POA: Diagnosis not present

## 2021-07-11 DIAGNOSIS — E785 Hyperlipidemia, unspecified: Secondary | ICD-10-CM | POA: Diagnosis not present

## 2021-07-11 DIAGNOSIS — I341 Nonrheumatic mitral (valve) prolapse: Secondary | ICD-10-CM | POA: Diagnosis not present

## 2021-07-11 DIAGNOSIS — Z0000 Encounter for general adult medical examination without abnormal findings: Secondary | ICD-10-CM | POA: Diagnosis not present

## 2021-08-01 DIAGNOSIS — N411 Chronic prostatitis: Secondary | ICD-10-CM | POA: Diagnosis not present

## 2021-08-01 DIAGNOSIS — R8271 Bacteriuria: Secondary | ICD-10-CM | POA: Diagnosis not present

## 2021-08-01 DIAGNOSIS — C61 Malignant neoplasm of prostate: Secondary | ICD-10-CM | POA: Diagnosis not present

## 2021-08-03 ENCOUNTER — Emergency Department (HOSPITAL_COMMUNITY): Payer: PPO

## 2021-08-03 ENCOUNTER — Emergency Department (HOSPITAL_COMMUNITY)
Admission: EM | Admit: 2021-08-03 | Discharge: 2021-08-03 | Disposition: A | Discharge status: Home / Self Care | Payer: PPO | Attending: Emergency Medicine | Admitting: Emergency Medicine

## 2021-08-03 ENCOUNTER — Encounter (HOSPITAL_COMMUNITY): Payer: Self-pay | Admitting: Emergency Medicine

## 2021-08-03 DIAGNOSIS — W010XXA Fall on same level from slipping, tripping and stumbling without subsequent striking against object, initial encounter: Secondary | ICD-10-CM | POA: Diagnosis not present

## 2021-08-03 DIAGNOSIS — Z7982 Long term (current) use of aspirin: Secondary | ICD-10-CM | POA: Diagnosis not present

## 2021-08-03 DIAGNOSIS — Z8546 Personal history of malignant neoplasm of prostate: Secondary | ICD-10-CM | POA: Insufficient documentation

## 2021-08-03 DIAGNOSIS — Q741 Congenital malformation of knee: Secondary | ICD-10-CM | POA: Diagnosis not present

## 2021-08-03 DIAGNOSIS — M25562 Pain in left knee: Secondary | ICD-10-CM | POA: Diagnosis not present

## 2021-08-03 DIAGNOSIS — S76112A Strain of left quadriceps muscle, fascia and tendon, initial encounter: Secondary | ICD-10-CM | POA: Insufficient documentation

## 2021-08-03 DIAGNOSIS — S8992XA Unspecified injury of left lower leg, initial encounter: Secondary | ICD-10-CM | POA: Diagnosis present

## 2021-08-03 MED ORDER — HYDROCODONE-ACETAMINOPHEN 5-325 MG PO TABS: 1.0000 | ORAL_TABLET | ORAL | 0 refills | Status: DC | PRN

## 2021-08-03 NOTE — ED Provider Notes (Signed)
Emergency Medicine Provider Triage Evaluation Note  Thomas [name] , a 75 y.o. male  was evaluated in triage.  Pt complains of left knee pain, swelling. Patient slipped on the dog bed today and fell injuring his left knee. Denies hitting head, LOC, neck pain.  Review of Systems  Positive: Left knee pain Negative: Lacerations, numbness  Physical Exam  There were no vitals taken for this visit. Gen:   Awake, no distress   Resp:  Normal effort  MSK:   Moves extremities without difficulty  Other:  Palpable defect proximal to left patella, unable to straight leg raise.  Medical Decision Making  Medically screening exam initiated at 10:10 AM.  Appropriate orders placed.  Thomas [name] was informed that the remainder of the evaluation will be completed by another provider, this initial triage assessment does not replace that evaluation, and the importance of remaining in the ED until their evaluation is complete.  Patient is on unknown antibiotic from urology for UTI.  Suspect tendon rupture.  Will order x-ray.   Tacy Learn, PA-C 08/03/21 1015    Lucrezia Starch, MD 08/03/21 1315

## 2021-08-03 NOTE — Discharge Instructions (Addendum)
Follow-up with orthopedics, call to schedule an appointment. Wear your knee immobilizer.  You may weight-bear as tolerated.  Take Norco as needed as prescribed for pain.  Do not drive or operate machinery while taking Norco.  This medication can cause constipation, take Colace and MiraLAX as needed as directed.

## 2021-08-03 NOTE — ED Provider Notes (Signed)
[facility] EMERGENCY DEPARTMENT Provider Note   CSN: [id number] Arrival date & time: 08/03/21  1007     History Chief Complaint  Patient presents with   Jobie Quaker III is a 75 y.o. male.  75 year old male presents with complaint of left knee injury.  Patient states that he slipped on a dog bed today and fell injuring his left knee, concerning he dislocated his knee due to swelling and inability to straighten his leg.  Did not hit head, no loss of consciousness.  No other injuries or concerns.  Patient is on unknown antibiotic for a UTI.      Past Medical History:  Diagnosis Date   Arthritis    Bilateral inguinal hernia    BPH (benign prostatic hyperplasia)    Cancer (HCC)    prostate cancer; per patient being followed by Dr [name] at [facility] urology ; currently no chemotherapy    Mitral regurgitation    Mitral valve prolapse    now seeing [name]    Pneumonia    At age 17   S/P minimally-invasive mitral valve repair 01/24/2021   Complex valvuloplasty including quadrangular resection of posterior leaflet with 32 mm Sorin Memo 4D ring annuloplasty via right mini thoracotomy result    Patient Active Problem List   Diagnosis Date Noted   S/P minimally-invasive mitral valve repair 01/24/2021   Mitral regurgitation    Mitral valve prolapse 07/14/2014    Past Surgical History:  Procedure Laterality Date   BUBBLE STUDY  11/23/2020   Procedure: BUBBLE STUDY;  Surgeon: [name], MD;  Location: [place];  Service: Cardiovascular;;   FEMORAL HERNIA REPAIR Right 07/22/2017   Procedure: HERNIA REPAIR FEMORAL;  Surgeon: Jackolyn Confer, MD;  [facility];  Service: General;  Laterality: Right;   HERNIA REPAIR     INGUINAL HERNIA REPAIR N/A 07/22/2017   Procedure: LAPAROSCOPIC BILATERAL INGUINAL HERNIA REPAIR WITH MESH;  Surgeon: Jackolyn Confer, MD;  [facility];  Service: General;  Laterality: N/A;   INSERTION OF MESH  Bilateral 07/22/2017   Procedure: INSERTION OF MESH;  Surgeon: Jackolyn Confer, MD;  [facility];  Service: General;  Laterality: Bilateral;   MITRAL VALVE REPAIR Right 01/24/2021   Procedure: MINIMALLY INVASIVE MITRAL VALVE REPAIR (MVR) USING 4D MEMO RING SIZE 32MM;  Surgeon: Rexene Alberts, MD;  Location: [place];  Service: Open Heart Surgery;  Laterality: Right;   RIGHT/LEFT HEART CATH AND CORONARY ANGIOGRAPHY N/A 12/23/2020   Procedure: RIGHT/LEFT HEART CATH AND CORONARY ANGIOGRAPHY;  Surgeon: Sherren Mocha, MD;  Location: [place] [facility];  Service: Cardiovascular;  Laterality: N/A;   TEE WITHOUT CARDIOVERSION N/A 11/23/2020   Procedure: TRANSESOPHAGEAL ECHOCARDIOGRAM (TEE);  Surgeon: [name], MD;  Location: [place];  Service: Cardiovascular;  Laterality: N/A;   TEE WITHOUT CARDIOVERSION N/A 01/24/2021   Procedure: TRANSESOPHAGEAL ECHOCARDIOGRAM (TEE);  Surgeon: Rexene Alberts, MD;  Location: [place];  Service: Open Heart Surgery;  Laterality: N/A;   TONSILLECTOMY AND ADENOIDECTOMY         No family history on file.  Social History   Tobacco Use   Smoking status: Never   Smokeless tobacco: Never  Vaping Use   Vaping Use: Never used  Substance Use Topics   Alcohol use: Not Currently    Comment: seldom    Drug use: No    Home Medications Prior to Admission medications   Medication Sig Start Date End Date Taking?  Authorizing Provider  HYDROcodone-acetaminophen (NORCO/VICODIN) 5-325 MG tablet Take 1 tablet by mouth every 4 (four) hours as needed. 08/03/21  Yes Tacy Learn, PA-C  Ascorbic Acid (VITAMIN C) 1000 MG tablet Take 1,000 mg by mouth daily.    [provider]  aspirin EC 81 MG tablet Take 81 mg by mouth daily. Swallow whole.    [provider]  Calcium Carb-Cholecalciferol (CALCIUM 600/VITAMIN D3) 600-800 MG-UNIT TABS Take 2 tablets by mouth daily.    [provider]  Magnesium Oxide 250 MG TABS Take 250 mg by mouth  daily.    [provider]  metoprolol tartrate (LOPRESSOR) 25 MG tablet Take 0.5 tablets (12.5 mg total) by mouth 2 (two) times daily. 02/22/21   [name], MD  Multiple Vitamins-Minerals (MULTIVITAMIN WITH MINERALS) tablet Take 1 tablet by mouth daily.    [provider]    Allergies    Patient has no known allergies.  Review of Systems   Review of Systems  Constitutional:  Negative for fever.  Musculoskeletal:  Positive for arthralgias and joint swelling.  Skin:  Negative for rash and wound.  Neurological:  Positive for weakness. Negative for numbness and headaches.  Hematological:  Does not bruise/bleed easily.  Psychiatric/Behavioral:  Negative for confusion.    Physical Exam Updated Vital Signs BP 129/89 (BP Location: Right Arm)   Pulse 73   Temp 99.1 F (37.3 C) (Oral)   Resp 16   SpO2 100%   Physical Exam Vitals and nursing note reviewed.  Constitutional:      General: He is not in acute distress.    Appearance: He is well-developed. He is not diaphoretic.  HENT:     Head: Normocephalic and atraumatic.  Cardiovascular:     Pulses: Normal pulses.  Pulmonary:     Effort: Pulmonary effort is normal.  Musculoskeletal:        General: Swelling, deformity and signs of injury present. No tenderness.     Comments: Regarding patient's left knee, there is a palpable defect proximal to the left patella, suspect quadriceps tendon rupture.  He is unable to straight leg raise his left leg.  DP pulse intact, sensation intact.  No bony tenderness or crepitus.  Skin:    General: Skin is warm and dry.     Findings: No erythema or rash.  Neurological:     Mental Status: He is alert and oriented to person, place, and time.     Sensory: No sensory deficit.     Motor: Weakness present.  Psychiatric:        Behavior: Behavior normal.    ED Results / Procedures / Treatments   Labs (all labs ordered are listed, but only abnormal results are displayed) Labs  Reviewed - No data to display  EKG None  Radiology MR KNEE LEFT WO CONTRAST  Result Date: 08/03/2021 CLINICAL DATA:  Left knee pain after fall this morning. Concern for quadriceps tendon injury. EXAM: MRI OF THE LEFT KNEE WITHOUT CONTRAST TECHNIQUE: Multiplanar, multisequence MR imaging of the knee was performed. No intravenous contrast was administered. COMPARISON:  Left knee x-rays from same day. FINDINGS: MENISCI Medial meniscus:  Degenerated body with small radial tear. Lateral meniscus:  Small radial tear of the body. LIGAMENTS Cruciates:  Intact ACL and PCL. Collaterals: Medial collateral ligament is intact. Lateral collateral ligament complex is intact. CARTILAGE Patellofemoral: High-grade partial and full-thickness cartilage loss over the patella. Medial:  Cartilage degeneration without focal defect. Lateral:  Cartilage degeneration without  focal defect. Joint:  Trace suprapatellar joint effusion.  Normal Hoffa's fat. Popliteal Fossa:  No Baker cyst. Intact popliteus tendon. Extensor Mechanism: Complete tear of the distal quadriceps tendon with up to 1.7 cm retraction. Laxity of the patellar tendon. Bones: No acute fracture or dislocation. No suspicious bone lesion. Tiny tricompartmental marginal osteophytes. Other: Prepatellar soft tissue swelling. IMPRESSION: 1. Complete tear of the distal quadriceps tendon with up to 1.7 cm retraction. 2. Degenerated medial meniscus body with small radial tear. 3. Small radial tear of the lateral meniscus body. 4. Tricompartmental osteoarthritis, worst in the patellofemoral compartment. Electronically Signed   By: Titus Dubin M.D.   On: 08/03/2021 15:06   DG Knee Complete 4 Views Left  Result Date: 08/03/2021 CLINICAL DATA:  Knee pain status post fall. Defect above patella suspicious for tendinous rupture. EXAM: LEFT KNEE - COMPLETE 4+ VIEW COMPARISON:  None. FINDINGS: There is thickening of the quadriceps tendon containing multiple calcifications. The  patella also appears low in position. No additional fracture or dislocation. IMPRESSION: Thickening of the distal patellar tendon with inferior positioning of the patella is suspicious for quadriceps tendon rupture. Electronically Signed   By: Miachel Roux M.D.   On: 08/03/2021 11:05    Procedures Procedures   Medications Ordered in ED Medications - No data to display  ED Course  I have reviewed the triage vital signs and the nursing notes.  Pertinent labs & imaging results that were available during my care of the patient were reviewed by me and considered in my medical decision making (see chart for details).  Clinical Course as of 08/03/21 1520  Thu Aug 03, 7444  3450 75 year old male with left knee injury concerning for quadriceps tendon rupture.  X-ray consistent with quadriceps tendon rupture.  I discussed with Hilbert Odor, PA-C with orthopedics, recommends MRI of the knee without contrast to evaluate for partial versus complete rupture, recommends knee immobilizer and follow-up with Dr. Stann Mainland. [LM]  0037 MRI with complete rupture of quadriceps tendon.  Patient be placed in a long knee immobilizer, given Norco for pain and referred to orthopedics. [LM]    Clinical Course User Index [LM] Roque Lias   MDM Rules/Calculators/A&P                           Final Clinical Impression(s) / ED Diagnoses Final diagnoses:  Quadriceps tendon rupture, left, initial encounter    Rx / DC Orders ED Discharge Orders          Ordered    HYDROcodone-acetaminophen (NORCO/VICODIN) 5-325 MG tablet  Every 4 hours PRN        08/03/21 1519             Tacy Learn, PA-C 08/03/21 1520    Lucrezia Starch, MD 08/04/21 [phone]

## 2021-08-03 NOTE — ED Triage Notes (Signed)
Pt tripped on a dog bed walking into his sons house this AM and has pain to left leg.

## 2021-08-04 DIAGNOSIS — S76192A Other specified injury of left quadriceps muscle, fascia and tendon, initial encounter: Secondary | ICD-10-CM | POA: Diagnosis not present

## 2021-08-07 ENCOUNTER — Other Ambulatory Visit: Payer: Self-pay

## 2021-08-07 ENCOUNTER — Encounter (HOSPITAL_COMMUNITY): Payer: Self-pay | Admitting: Orthopedic Surgery

## 2021-08-07 NOTE — Progress Notes (Signed)
Please enter orders for surgery scheduled for 08-09-21

## 2021-08-07 NOTE — Progress Notes (Addendum)
COVID swab appointment:  Same Day  COVID Vaccine Completed: Yes x4 Date COVID Vaccine completed: Has received booster:  Yes x2 COVID vaccine manufacturer: Pfizer      Date of COVID positive in last 90 days:  N/A  PCP - [name], MD Cardiologist - Candee Furbish, MD  Chest x-ray - 03-13-21 Epic EKG - 02-14-21 Epic Stress Test - N/A ECHO - 03-09-21 Epic Cardiac Cath - 12-23-20 Epic Pacemaker/ICD device last checked: Spinal Cord Stimulator:  Sleep Study - N/A CPAP -   Fasting Blood Sugar - N/A Checks Blood Sugar _____ times a day  Blood Thinner Instructions: Aspirin Instructions:  ASA 81 mg.   Last Dose:  07-25-21  Activity level:   Can go up a flight of stairs and perform activities of daily living without stopping and without symptoms of chest pain or shortness of breath.  Some difficulty climbing stairs due to injury.   Anesthesia review: Mitral valve repair, mitral regurg and MVP.    Patient denies shortness of breath, fever, cough and chest pain at PAT appointment (completed over the phone)   Patient verbalized understanding of instructions that were given to them at the PAT appointment. Patient was also instructed that they will need to review over the PAT instructions again at home before surgery.

## 2021-08-07 NOTE — Progress Notes (Signed)
Anesthesia Chart Review   Case: [id number] Date/Time: 08/09/21 1645   Procedure: OPEN LEFT KNEE QUAD REPAIR (Left)   Anesthesia type: Choice   Pre-op diagnosis: Left quad tendon rupture   Location: [facility]OM 03 / [facility]   Surgeons: Nicholes Stairs, MD       DISCUSSION:74 y.o. never smoker with h/o MVP s/p minimal invasive mitral valve repair 01/24/2021, prostate cancer, BPH, left quad tendon rupture scheduled for above procedure 08/09/2021 with Dr. Nicholes Stairs.   Pt last seen by cardiology 06/09/2021. Per OV note pt doing well.  At this time he was planning on cysto, per Dr. [name], "ok for cysto if needed. Off coumadin."  Echo 03/09/2021 stable. VS: Ht 5\' 10"  (1.778 m)   Wt 78 kg   BMI 24.68 kg/m   PROVIDERS: [name], MD is PCP   Candee Furbish, MD is Cardiologist  LABS:  labs DOS (all labs ordered are listed, but only abnormal results are displayed)  Labs Reviewed - No data to display   IMAGES:   EKG: 02/14/2021 Rate 90 bpm  NSR  CV: Echo 03/09/2021   1. Left ventricular ejection fraction, by estimation, is 50 to 55%. Left  ventricular ejection fraction by 3D volume is 53 %. The left ventricle has  low normal function. The left ventricle has no regional wall motion  abnormalities. Left ventricular  diastolic function could not be evaluated.   2. Right ventricular systolic function is normal. The right ventricular  size is normal. There is normal pulmonary artery systolic pressure.   3. Left atrial size was moderately dilated.   4. The mitral valve is myxomatous. No evidence of mitral valve  regurgitation. No evidence of mitral stenosis. The mean mitral valve  gradient is 3.5 mmHg with average heart rate of 75 bpm. There is a 32 mm  Memo prosthetic annuloplasty ring present in the   mitral position. Procedure Date: 01/24/2021.   5. The aortic valve is tricuspid. Aortic valve regurgitation is trivial.  Mild aortic valve sclerosis is present, with no  evidence of aortic valve  stenosis.   Cardiac Cath 12/23/2020 1.  Patent coronary arteries with mild nonobstructive narrowing at the ostium of the left main and ostium of the left circumflex, no significant stenosis present. 2.  Known severe mitral regurgitation by noninvasive assessment with normal pulmonary capillary wedge pressure and pulmonary artery pressures, preserved cardiac output   Recommend: Continued surgical evaluation for consideration of mitral valve repair Past Medical History:  Diagnosis Date   Arthritis    Bilateral inguinal hernia    BPH (benign prostatic hyperplasia)    Cancer (HCC)    prostate cancer; per patient being followed by Dr [name] at [facility] urology ; currently no chemotherapy    Heart murmur    Mitral regurgitation    Mitral valve prolapse    now seeing [name]    Pneumonia    At age 89   Prostate cancer [facility])    S/P minimally-invasive mitral valve repair 01/24/2021   Complex valvuloplasty including quadrangular resection of posterior leaflet with 32 mm Sorin Memo 4D ring annuloplasty via right mini thoracotomy result    Past Surgical History:  Procedure Laterality Date   BUBBLE STUDY  11/23/2020   Procedure: BUBBLE STUDY;  Surgeon: [name], MD;  Location: [place];  Service: Cardiovascular;;   CARDIAC CATHETERIZATION     FEMORAL HERNIA REPAIR Right 07/22/2017   Procedure: HERNIA REPAIR FEMORAL;  Surgeon: Jackolyn Confer, MD;  [facility];  Service: General;  Laterality: Right;   HERNIA REPAIR     INGUINAL HERNIA REPAIR N/A 07/22/2017   Procedure: LAPAROSCOPIC BILATERAL INGUINAL HERNIA REPAIR WITH MESH;  Surgeon: Jackolyn Confer, MD;  [facility];  Service: General;  Laterality: N/A;   INSERTION OF MESH Bilateral 07/22/2017   Procedure: INSERTION OF MESH;  Surgeon: Jackolyn Confer, MD;  [facility];  Service: General;  Laterality: Bilateral;   MITRAL VALVE REPAIR Right 01/24/2021   Procedure: MINIMALLY  INVASIVE MITRAL VALVE REPAIR (MVR) USING 4D MEMO RING SIZE 32MM;  Surgeon: Rexene Alberts, MD;  Location: [place];  Service: Open Heart Surgery;  Laterality: Right;   PROSTATE BIOPSY     RIGHT/LEFT HEART CATH AND CORONARY ANGIOGRAPHY N/A 12/23/2020   Procedure: RIGHT/LEFT HEART CATH AND CORONARY ANGIOGRAPHY;  Surgeon: Sherren Mocha, MD;  Location: [place] [facility];  Service: Cardiovascular;  Laterality: N/A;   TEE WITHOUT CARDIOVERSION N/A 11/23/2020   Procedure: TRANSESOPHAGEAL ECHOCARDIOGRAM (TEE);  Surgeon: [name], MD;  Location: [place];  Service: Cardiovascular;  Laterality: N/A;   TEE WITHOUT CARDIOVERSION N/A 01/24/2021   Procedure: TRANSESOPHAGEAL ECHOCARDIOGRAM (TEE);  Surgeon: Rexene Alberts, MD;  Location: [place];  Service: Open Heart Surgery;  Laterality: N/A;   TONSILLECTOMY AND ADENOIDECTOMY     VASECTOMY      MEDICATIONS: No current facility-administered medications for this encounter.    acetaminophen (TYLENOL) 500 MG tablet   cefdinir (OMNICEF) 300 MG capsule   HYDROcodone-acetaminophen (NORCO/VICODIN) 5-325 MG tablet   metoprolol tartrate (LOPRESSOR) 25 MG tablet   Ascorbic Acid (VITAMIN C) 1000 MG tablet   aspirin EC 81 MG tablet   Calcium Carb-Cholecalciferol (CALCIUM 600/VITAMIN D3) 600-800 MG-UNIT TABS   HYDROcodone-acetaminophen (NORCO/VICODIN) 5-325 MG tablet   Magnesium Oxide 250 MG TABS   Multiple Vitamins-Minerals (MULTIVITAMIN WITH MINERALS) tablet   Konrad Felix Ward, PA-C [facility]Surgical Testing [phone]

## 2021-08-08 ENCOUNTER — Other Ambulatory Visit (HOSPITAL_COMMUNITY): Payer: Self-pay | Admitting: Urology

## 2021-08-08 DIAGNOSIS — C61 Malignant neoplasm of prostate: Secondary | ICD-10-CM

## 2021-08-08 NOTE — Anesthesia Preprocedure Evaluation (Addendum)
Anesthesia Evaluation  Patient identified by MRN, date of birth, ID band Patient awake    Reviewed: Allergy & Precautions, NPO status , Patient's Chart, lab work & pertinent test results  Airway Mallampati: II  TM Distance: >3 FB Neck ROM: Full    Dental no notable dental hx.    Pulmonary neg pulmonary ROS,    Pulmonary exam normal breath sounds clear to auscultation       Cardiovascular Normal cardiovascular exam+ Valvular Problems/Murmurs MVP  Rhythm:Regular Rate:Normal     Neuro/Psych negative neurological ROS  negative psych ROS   GI/Hepatic negative GI ROS, Neg liver ROS,   Endo/Other  negative endocrine ROS  Renal/GU negative Renal ROS  negative genitourinary   Musculoskeletal  (+) Arthritis , Osteoarthritis,    Abdominal   Peds negative pediatric ROS (+)  Hematology negative hematology ROS (+)   Anesthesia Other Findings   Reproductive/Obstetrics negative OB ROS                             Anesthesia Physical Anesthesia Plan  ASA: 2  Anesthesia Plan: General   Post-op Pain Management:  Regional for Post-op pain   Induction: Intravenous  PONV Risk Score and Plan: 2 and Ondansetron, Midazolam and Treatment may vary due to age or medical condition  Airway Management Planned: LMA  Additional Equipment:   Intra-op Plan:   Post-operative Plan: Extubation in OR  Informed Consent: I have reviewed the patients History and Physical, chart, labs and discussed the procedure including the risks, benefits and alternatives for the proposed anesthesia with the patient or authorized representative who has indicated his/her understanding and acceptance.     Dental advisory given  Plan Discussed with: CRNA  Anesthesia Plan Comments: (See PAT note 08/07/2021, Konrad Felix Ward, PA-C)      Anesthesia Quick Evaluation

## 2021-08-09 ENCOUNTER — Ambulatory Visit (HOSPITAL_COMMUNITY): Payer: PPO | Admitting: Physician Assistant

## 2021-08-09 ENCOUNTER — Observation Stay (HOSPITAL_COMMUNITY)
Admission: RE | Admit: 2021-08-09 | Discharge: 2021-08-10 | Disposition: A | Discharge status: Home / Self Care | Payer: PPO | Source: Ambulatory Visit | Attending: Orthopedic Surgery | Admitting: Orthopedic Surgery

## 2021-08-09 ENCOUNTER — Other Ambulatory Visit: Payer: Self-pay

## 2021-08-09 ENCOUNTER — Encounter (HOSPITAL_COMMUNITY): Payer: Self-pay | Admitting: Orthopedic Surgery

## 2021-08-09 ENCOUNTER — Encounter (HOSPITAL_COMMUNITY)
Admission: RE | Disposition: A | Discharge status: Home / Self Care | Payer: Self-pay | Source: Ambulatory Visit | Attending: Orthopedic Surgery

## 2021-08-09 DIAGNOSIS — S76192A Other specified injury of left quadriceps muscle, fascia and tendon, initial encounter: Secondary | ICD-10-CM | POA: Diagnosis not present

## 2021-08-09 DIAGNOSIS — W010XXA Fall on same level from slipping, tripping and stumbling without subsequent striking against object, initial encounter: Secondary | ICD-10-CM | POA: Diagnosis not present

## 2021-08-09 DIAGNOSIS — Z8546 Personal history of malignant neoplasm of prostate: Secondary | ICD-10-CM | POA: Insufficient documentation

## 2021-08-09 DIAGNOSIS — Z20822 Contact with and (suspected) exposure to covid-19: Secondary | ICD-10-CM | POA: Diagnosis not present

## 2021-08-09 DIAGNOSIS — I341 Nonrheumatic mitral (valve) prolapse: Secondary | ICD-10-CM | POA: Diagnosis not present

## 2021-08-09 DIAGNOSIS — S76112A Strain of left quadriceps muscle, fascia and tendon, initial encounter: Secondary | ICD-10-CM | POA: Diagnosis present

## 2021-08-09 DIAGNOSIS — G8918 Other acute postprocedural pain: Secondary | ICD-10-CM | POA: Diagnosis not present

## 2021-08-09 DIAGNOSIS — I340 Nonrheumatic mitral (valve) insufficiency: Secondary | ICD-10-CM | POA: Diagnosis not present

## 2021-08-09 HISTORY — PX: QUADRICEPS TENDON REPAIR: SHX756

## 2021-08-09 HISTORY — DX: Malignant neoplasm of prostate: C61

## 2021-08-09 HISTORY — DX: Cardiac murmur, unspecified: R01.1

## 2021-08-09 LAB — CBC
HCT: 40.3 % (ref 39.0–52.0)
Hemoglobin: 13.5 g/dL (ref 13.0–17.0)
MCH: 32.8 pg (ref 26.0–34.0)
MCHC: 33.5 g/dL (ref 30.0–36.0)
MCV: 98.1 fL (ref 80.0–100.0)
Platelets: 225 10*3/uL (ref 150–400)
RBC: 4.11 MIL/uL — ABNORMAL LOW (ref 4.22–5.81)
RDW: 13.3 % (ref 11.5–15.5)
WBC: 9.8 10*3/uL (ref 4.0–10.5)
nRBC: 0 % (ref 0.0–0.2)

## 2021-08-09 LAB — BASIC METABOLIC PANEL
Anion gap: 12 (ref 5–15)
BUN: 25 mg/dL — ABNORMAL HIGH (ref 8–23)
CO2: 26 mmol/L (ref 22–32)
Calcium: 9.6 mg/dL (ref 8.9–10.3)
Chloride: 105 mmol/L (ref 98–111)
Creatinine, Ser: 0.86 mg/dL (ref 0.61–1.24)
GFR, Estimated: >60 mL/min (ref >60)
Glucose, Bld: 94 mg/dL (ref 70–99)
Potassium: 4.2 mmol/L (ref 3.5–5.1)
Sodium: 143 mmol/L (ref 135–145)

## 2021-08-09 LAB — SARS CORONAVIRUS 2 BY RT PCR (HOSPITAL ORDER, PERFORMED IN ~~LOC~~ HOSPITAL LAB): SARS Coronavirus 2: NEGATIVE

## 2021-08-09 SURGERY — REPAIR, TENDON, QUADRICEPS
Anesthesia: General | Site: Knee | Laterality: Left

## 2021-08-09 MED ORDER — ONDANSETRON HCL 4 MG/2ML IJ SOLN
INTRAMUSCULAR | Status: AC
  Filled 2021-08-09: qty 2

## 2021-08-09 MED ORDER — FENTANYL CITRATE (PF) 100 MCG/2ML IJ SOLN
INTRAMUSCULAR | Status: DC | PRN
  Administered 2021-08-09: 25 ug via INTRAVENOUS
  Administered 2021-08-09: 50 ug via INTRAVENOUS

## 2021-08-09 MED ORDER — HYDROCODONE-ACETAMINOPHEN 5-325 MG PO TABS: 1.0000 | ORAL_TABLET | ORAL | Status: DC | PRN

## 2021-08-09 MED ORDER — DOCUSATE SODIUM 100 MG PO CAPS
100.0000 mg | ORAL_CAPSULE | Freq: Two times a day (BID) | ORAL | Status: DC
  Administered 2021-08-09 – 2021-08-10 (×2): 100 mg via ORAL
  Filled 2021-08-09 (×2): qty 1

## 2021-08-09 MED ORDER — ACETAMINOPHEN 325 MG PO TABS: 325.0000 mg | ORAL_TABLET | Freq: Four times a day (QID) | ORAL | Status: DC | PRN

## 2021-08-09 MED ORDER — MAGNESIUM OXIDE -MG SUPPLEMENT 400 (240 MG) MG PO TABS
200.0000 mg | ORAL_TABLET | Freq: Every day | ORAL | Status: DC
  Administered 2021-08-09: 200 mg via ORAL
  Filled 2021-08-09: qty 1

## 2021-08-09 MED ORDER — ENOXAPARIN SODIUM 40 MG/0.4ML IJ SOSY
40.0000 mg | PREFILLED_SYRINGE | INTRAMUSCULAR | Status: DC
  Administered 2021-08-10: 40 mg via SUBCUTANEOUS
  Filled 2021-08-09: qty 0.4

## 2021-08-09 MED ORDER — HYDROCODONE-ACETAMINOPHEN 5-325 MG PO TABS: 1.0000 | ORAL_TABLET | ORAL | 0 refills | Status: DC | PRN

## 2021-08-09 MED ORDER — MORPHINE SULFATE (PF) 2 MG/ML IV SOLN: 0.5000 mg | INTRAVENOUS | Status: DC | PRN

## 2021-08-09 MED ORDER — LIDOCAINE HCL (PF) 2 % IJ SOLN
INTRAMUSCULAR | Status: AC
  Filled 2021-08-09: qty 5

## 2021-08-09 MED ORDER — PHENYLEPHRINE HCL (PRESSORS) 10 MG/ML IV SOLN
INTRAVENOUS | Status: AC
  Filled 2021-08-09: qty 1

## 2021-08-09 MED ORDER — DEXAMETHASONE SODIUM PHOSPHATE 10 MG/ML IJ SOLN
INTRAMUSCULAR | Status: AC
  Filled 2021-08-09: qty 1

## 2021-08-09 MED ORDER — ONDANSETRON HCL 4 MG/2ML IJ SOLN: 4.0000 mg | Freq: Four times a day (QID) | INTRAMUSCULAR | Status: DC | PRN

## 2021-08-09 MED ORDER — CEFAZOLIN SODIUM-DEXTROSE 2-4 GM/100ML-% IV SOLN
2.0000 g | INTRAVENOUS | Status: AC
  Administered 2021-08-09: 2 g via INTRAVENOUS
  Filled 2021-08-09: qty 100

## 2021-08-09 MED ORDER — ONDANSETRON HCL 4 MG PO TABS: 4.0000 mg | ORAL_TABLET | Freq: Four times a day (QID) | ORAL | Status: DC | PRN

## 2021-08-09 MED ORDER — PHENYLEPHRINE HCL-NACL 20-0.9 MG/250ML-% IV SOLN
INTRAVENOUS | Status: DC | PRN
  Administered 2021-08-09: 30 ug/min via INTRAVENOUS

## 2021-08-09 MED ORDER — LIDOCAINE 2% (20 MG/ML) 5 ML SYRINGE
INTRAMUSCULAR | Status: DC | PRN
Start: 2021-08-09 — End: 2021-08-09
  Administered 2021-08-09: 60 mg via INTRAVENOUS

## 2021-08-09 MED ORDER — ROPIVACAINE HCL 5 MG/ML IJ SOLN
INTRAMUSCULAR | Status: DC | PRN
  Administered 2021-08-09: 30 mL via PERINEURAL

## 2021-08-09 MED ORDER — DEXAMETHASONE SODIUM PHOSPHATE 10 MG/ML IJ SOLN
INTRAMUSCULAR | Status: DC | PRN
Start: 2021-08-09 — End: 2021-08-09
  Administered 2021-08-09: 8 mg via INTRAVENOUS

## 2021-08-09 MED ORDER — 0.9 % SODIUM CHLORIDE (POUR BTL) OPTIME
TOPICAL | Status: DC | PRN
  Administered 2021-08-09: 1000 mL

## 2021-08-09 MED ORDER — METOCLOPRAMIDE HCL 5 MG PO TABS: 5.0000 mg | ORAL_TABLET | Freq: Three times a day (TID) | ORAL | Status: DC | PRN

## 2021-08-09 MED ORDER — OXYCODONE HCL 5 MG PO TABS
5.0000 mg | ORAL_TABLET | Freq: Once | ORAL | Status: DC | PRN
Start: 2021-08-09 — End: 2021-08-09

## 2021-08-09 MED ORDER — MIDAZOLAM HCL 2 MG/2ML IJ SOLN
1.0000 mg | INTRAMUSCULAR | Status: DC
  Administered 2021-08-09: 2 mg via INTRAVENOUS
  Filled 2021-08-09: qty 2

## 2021-08-09 MED ORDER — FENTANYL CITRATE (PF) 100 MCG/2ML IJ SOLN
INTRAMUSCULAR | Status: AC
  Filled 2021-08-09: qty 2

## 2021-08-09 MED ORDER — AMISULPRIDE (ANTIEMETIC) 5 MG/2ML IV SOLN: 10.0000 mg | Freq: Once | INTRAVENOUS | Status: DC | PRN

## 2021-08-09 MED ORDER — HYDROCODONE-ACETAMINOPHEN 7.5-325 MG PO TABS: 1.0000 | ORAL_TABLET | ORAL | Status: DC | PRN

## 2021-08-09 MED ORDER — LACTATED RINGERS IV SOLN: INTRAVENOUS | Status: DC

## 2021-08-09 MED ORDER — METOPROLOL TARTRATE 12.5 MG HALF TABLET
12.5000 mg | ORAL_TABLET | Freq: Two times a day (BID) | ORAL | Status: DC
  Administered 2021-08-09 – 2021-08-10 (×2): 12.5 mg via ORAL
  Filled 2021-08-09 (×2): qty 1

## 2021-08-09 MED ORDER — PHENYLEPHRINE 40 MCG/ML (10ML) SYRINGE FOR IV PUSH (FOR BLOOD PRESSURE SUPPORT)
PREFILLED_SYRINGE | INTRAVENOUS | Status: DC | PRN
  Administered 2021-08-09: 80 ug via INTRAVENOUS
  Administered 2021-08-09: 120 ug via INTRAVENOUS
  Administered 2021-08-09: 80 ug via INTRAVENOUS
  Administered 2021-08-09: 120 ug via INTRAVENOUS

## 2021-08-09 MED ORDER — FENTANYL CITRATE PF 50 MCG/ML IJ SOSY
50.0000 ug | PREFILLED_SYRINGE | INTRAMUSCULAR | Status: DC
  Administered 2021-08-09: 50 ug via INTRAVENOUS
  Filled 2021-08-09: qty 2

## 2021-08-09 MED ORDER — ONDANSETRON HCL 4 MG PO TABS: 4.0000 mg | ORAL_TABLET | Freq: Every day | ORAL | 1 refills | Status: DC | PRN

## 2021-08-09 MED ORDER — PROMETHAZINE HCL 25 MG/ML IJ SOLN: 6.2500 mg | INTRAMUSCULAR | Status: DC | PRN

## 2021-08-09 MED ORDER — PHENYLEPHRINE 40 MCG/ML (10ML) SYRINGE FOR IV PUSH (FOR BLOOD PRESSURE SUPPORT)
PREFILLED_SYRINGE | INTRAVENOUS | Status: AC
  Filled 2021-08-09: qty 10

## 2021-08-09 MED ORDER — PROPOFOL 10 MG/ML IV BOLUS
INTRAVENOUS | Status: DC | PRN
  Administered 2021-08-09: 200 mg via INTRAVENOUS

## 2021-08-09 MED ORDER — ONDANSETRON HCL 4 MG/2ML IJ SOLN
INTRAMUSCULAR | Status: DC | PRN
  Administered 2021-08-09: 4 mg via INTRAVENOUS

## 2021-08-09 MED ORDER — HYDROMORPHONE HCL 1 MG/ML IJ SOLN: 0.2500 mg | INTRAMUSCULAR | Status: DC | PRN

## 2021-08-09 MED ORDER — OXYCODONE HCL 5 MG/5ML PO SOLN: 5.0000 mg | Freq: Once | ORAL | Status: DC | PRN

## 2021-08-09 MED ORDER — CHLORHEXIDINE GLUCONATE 0.12 % MT SOLN
15.0000 mL | Freq: Once | OROMUCOSAL | Status: AC
  Administered 2021-08-09: 15 mL via OROMUCOSAL

## 2021-08-09 MED ORDER — ORAL CARE MOUTH RINSE: 15.0000 mL | Freq: Once | OROMUCOSAL | Status: AC

## 2021-08-09 MED ORDER — PROPOFOL 10 MG/ML IV BOLUS
INTRAVENOUS | Status: AC
  Filled 2021-08-09: qty 20

## 2021-08-09 MED ORDER — CEFAZOLIN SODIUM-DEXTROSE 2-4 GM/100ML-% IV SOLN: 2.0000 g | INTRAVENOUS | Status: DC

## 2021-08-09 MED ORDER — METOCLOPRAMIDE HCL 5 MG/ML IJ SOLN: 5.0000 mg | Freq: Three times a day (TID) | INTRAMUSCULAR | Status: DC | PRN

## 2021-08-09 MED ORDER — CEFAZOLIN SODIUM-DEXTROSE 1-4 GM/50ML-% IV SOLN
1.0000 g | Freq: Four times a day (QID) | INTRAVENOUS | Status: AC
  Administered 2021-08-10 (×3): 1 g via INTRAVENOUS
  Filled 2021-08-09 (×3): qty 50

## 2021-08-09 MED ORDER — ACETAMINOPHEN 500 MG PO TABS
500.0000 mg | ORAL_TABLET | Freq: Four times a day (QID) | ORAL | Status: AC
  Administered 2021-08-09 – 2021-08-10 (×4): 500 mg via ORAL
  Filled 2021-08-09 (×4): qty 1

## 2021-08-09 MED ORDER — ASPIRIN EC 81 MG PO TBEC
81.0000 mg | DELAYED_RELEASE_TABLET | Freq: Every day | ORAL | Status: DC
  Administered 2021-08-10: 81 mg via ORAL
  Filled 2021-08-09: qty 1

## 2021-08-09 SURGICAL SUPPLY — 58 items
ADH SKN CLS APL DERMABOND .7 (GAUZE/BANDAGES/DRESSINGS) ×1
APL PRP STRL LF DISP 70% ISPRP (MISCELLANEOUS) ×1
BAG COUNTER SPONGE SURGICOUNT (BAG) IMPLANT
BAG SPEC THK2 15X12 ZIP CLS (MISCELLANEOUS) ×1
BAG SPNG CNTER NS LX DISP (BAG)
BAG ZIPLOCK 12X15 (MISCELLANEOUS) ×2 IMPLANT
BIT DRILL 2.8X128 (BIT) ×2 IMPLANT
BNDG CMPR MED 10X6 ELC LF (GAUZE/BANDAGES/DRESSINGS) ×1
BNDG ELASTIC 6X10 VLCR STRL LF (GAUZE/BANDAGES/DRESSINGS) ×1 IMPLANT
BNDG ELASTIC 6X5.8 VLCR STR LF (GAUZE/BANDAGES/DRESSINGS) ×2 IMPLANT
CHLORAPREP W/TINT 26 (MISCELLANEOUS) ×2 IMPLANT
CLSR STERI-STRIP ANTIMIC 1/2X4 (GAUZE/BANDAGES/DRESSINGS) ×1 IMPLANT
COVER SURGICAL LIGHT HANDLE (MISCELLANEOUS) ×2 IMPLANT
CUFF TOURN SGL QUICK 34 (TOURNIQUET CUFF)
CUFF TRNQT CYL 34X4.125X (TOURNIQUET CUFF) IMPLANT
DERMABOND ADVANCED (GAUZE/BANDAGES/DRESSINGS) ×1
DERMABOND ADVANCED .7 DNX12 (GAUZE/BANDAGES/DRESSINGS) ×1 IMPLANT
DRAPE INCISE IOBAN 66X45 STRL (DRAPES) ×6 IMPLANT
DRAPE ORTHO SPLIT 77X108 STRL (DRAPES) ×4
DRAPE SHEET LG 3/4 BI-LAMINATE (DRAPES) ×2 IMPLANT
DRAPE SURG ORHT 6 SPLT 77X108 (DRAPES) ×2 IMPLANT
DRAPE U-SHAPE 47X51 STRL (DRAPES) ×2 IMPLANT
DRSG AQUACEL AG ADV 3.5X 6 (GAUZE/BANDAGES/DRESSINGS) ×1 IMPLANT
DRSG AQUACEL AG ADV 3.5X10 (GAUZE/BANDAGES/DRESSINGS) ×2 IMPLANT
DRSG TEGADERM 4X4.75 (GAUZE/BANDAGES/DRESSINGS) ×2 IMPLANT
ELECT REM PT RETURN 15FT ADLT (MISCELLANEOUS) ×2 IMPLANT
FIBER TAPE 2MM (SUTURE) ×1 IMPLANT
GOWN STRL REUS W/TWL LRG LVL3 (GOWN DISPOSABLE) ×2 IMPLANT
GOWN STRL REUS W/TWL XL LVL3 (GOWN DISPOSABLE) ×4 IMPLANT
IMMOBILIZER KNEE 20 (SOFTGOODS) ×2
IMMOBILIZER KNEE 20 THIGH 36 (SOFTGOODS) ×1 IMPLANT
KIT TURNOVER KIT A (KITS) ×2 IMPLANT
MANIFOLD NEPTUNE II (INSTRUMENTS) ×2 IMPLANT
NDL MA TROC 1/2 CIR (NEEDLE) ×1 IMPLANT
NDL SPNL 18GX3.5 QUINCKE PK (NEEDLE) ×1 IMPLANT
NEEDLE MA TROC 1/2 CIR (NEEDLE) ×2 IMPLANT
NEEDLE SPNL 18GX3.5 QUINCKE PK (NEEDLE) ×2 IMPLANT
NS IRRIG 1000ML POUR BTL (IV SOLUTION) ×2 IMPLANT
PACK TOTAL KNEE CUSTOM (KITS) ×2 IMPLANT
PASSER SUT SWANSON 36MM LOOP (INSTRUMENTS) ×2 IMPLANT
PROTECTOR NERVE ULNAR (MISCELLANEOUS) ×2 IMPLANT
STAPLER VISISTAT 35W (STAPLE) ×2 IMPLANT
SUT FIBERWIRE #2 38 T-5 BLUE (SUTURE)
SUT FIBERWIRE #5 38 BLUE (WIRE) ×4 IMPLANT
SUT MNCRL AB 3-0 PS2 18 (SUTURE) ×2 IMPLANT
SUT MNCRL AB 4-0 PS2 18 (SUTURE) ×2 IMPLANT
SUT MON AB 2-0 SH 27 (SUTURE) ×4
SUT MON AB 2-0 SH27 (SUTURE) ×2 IMPLANT
SUT VIC AB 0 CT1 36 (SUTURE) ×1 IMPLANT
SUT VIC AB 2-0 CT1 27 (SUTURE) ×4
SUT VIC AB 2-0 CT1 27XBRD (SUTURE) IMPLANT
SUT VIC AB 2-0 CT1 TAPERPNT 27 (SUTURE) IMPLANT
SUTURE FIBERWR #2 38 T-5 BLUE (SUTURE) IMPLANT
SUTURE TAPE 1.3 40 TPR END (SUTURE) IMPLANT
SUTURETAPE 1.3 40 TPR END (SUTURE) ×2
SYS INTERNAL BRACE KNEE (Miscellaneous) ×2 IMPLANT
SYSTEM INTERNAL BRACE KNEE (Miscellaneous) IMPLANT
WATER STERILE IRR 1000ML POUR (IV SOLUTION) ×2 IMPLANT

## 2021-08-09 NOTE — Transfer of Care (Signed)
Immediate Anesthesia Transfer of Care Note  Patient: [name]  Procedure(s) Performed: OPEN LEFT KNEE QUAD REPAIR (Left: Knee)  Patient Location: PACU  Anesthesia Type:General  Level of Consciousness: drowsy  Airway & Oxygen Therapy: Patient Spontanous Breathing and Patient connected to face mask oxygen  Post-op Assessment: Report given to RN, Post -op Vital signs reviewed and stable and Patient moving all extremities X 4  Post vital signs: Reviewed and stable  Last Vitals:  Vitals Value Taken Time  BP 143/86   Temp    Pulse 80 08/09/21 1641  Resp 19 08/09/21 1641  SpO2 100 % 08/09/21 1641  Vitals shown include unvalidated device data.  Last Pain:  Vitals:   08/09/21 1333  TempSrc:   PainSc: 0-No pain      Patients Stated Pain Goal: 3 (18/84/16 6063)  Complications: No notable events documented.

## 2021-08-09 NOTE — Progress Notes (Signed)
Assisted Dr. Candida Peeling with Left Femoral nerve block. Side rails up, monitors on throughout procedure. See vital signs in flow sheet. Tolerated Procedure well.

## 2021-08-09 NOTE — Anesthesia Procedure Notes (Addendum)
Procedure Name: LMA Insertion Date/Time: 08/09/2021 3:23 PM Performed by: Niel Hummer, CRNA Pre-anesthesia Checklist: Patient identified, Emergency Drugs available, Suction available and Patient being monitored Patient Re-evaluated:Patient Re-evaluated prior to induction Oxygen Delivery Method: Circle system utilized Preoxygenation: Pre-oxygenation with 100% oxygen Induction Type: IV induction LMA: LMA with gastric port inserted LMA Size: 4.0 Number of attempts: 2 Dental Injury: Teeth and Oropharynx as per pre-operative assessment  Comments: LMA 4 attempted by CRNA and MDA unable to properly seat. LMA 4 with gastric port placed.

## 2021-08-09 NOTE — Op Note (Addendum)
Date of Surgery: 08/09/2021  INDICATIONS: Thomas [name] is a 75 y.o.-year-old male with a left quadriceps tendon rupture;  The Patient did consent to the procedure after discussion of the risks and benefits.  PREOPERATIVE DIAGNOSIS: Left quadriceps tendon rupture  POSTOPERATIVE DIAGNOSIS: Same.  PROCEDURE: Left open quadriceps tendon repair  SURGEON: [name], M.D.  ASSIST: Jonelle Sidle, PA-C  Assistant attestation:  PA McClung present for the entire procedure and assisted with all portions.  ANESTHESIA:  general, with regional  IV FLUIDS AND URINE: See anesthesia.  Tourniquet left thigh at 300 mmHg for less than 60 minutes  ESTIMATED BLOOD LOSS: 10 mL.  IMPLANTS: Arthrex 4.75 mm swivel lock anchor x2, fiber tape x1  DRAINS: None  COMPLICATIONS: None.  PREOPERATIVE INDICATIONS:  Thomas [name] is a pleasant 75 yo Male with a diagnosis of left quadriceps rupture who elected for surgical management in order to restore the function of the extensor mechanism.     The risks benefits and alternatives were discussed with the patient preoperatively including but not limited to the risks of infection, bleeding, nerve injury, cardiopulmonary complications, the need for revision surgery, hardware prominence, hardware failure, the need for hardware removal, nonunion, malunion, posttraumatic arthritis, stiffness, loss of strength and function, among others, and the patient was willing to proceed.     OPERATIVE FINDINGS: Chronic appearing complete quadriceps tendon rupture from superior pole of the patella.  There was a short less than 1 cm stump remaining on the superior pole of the patella.   OPERATIVE PROCEDURE: The patient was brought to the operating room and placed in the supine position. General anesthesia was administered. IV antibiotics were given. The lower extremity was prepped and draped in usual sterile fashion.  Timeout performed.  Anterior incision was made over the  patella and quadriceps tendon.  Dissection was carried down to the layer of the prepatellar retinaculum.  Proximal dissection was carried forth to the level of the tear.  The tendon was noted to be completely ruptured and retracted about 5 cm proximally.  There were extensive adhesions noted between the sensor mechanism and the subcutaneous fat.  The retinaculum was torn on either side.   Once the adhesions were released I then utilized to Arthrex FiberTape to run a Krakw running locking suture figuration on the medial half up and lateral half up and down to the tendon stump.     Next we prepared the superior pole of the patella.  We debrided the stump from the superior pole.  All devitalized tissue was removed.  We next used rondure your to biologically prepare the superior pole bone.   Next we moved to secure the suture limbs into the superior pole of the patella.  We prepared for the placement of 2 parallel 4.75 mm Arthrex swivel lock anchors.  Drill pins were placed.  We then overdrilled with the appropriate cannulated drill bit to a depth of 20 mm.  We next used the hand tap to prepare the drill sockets.  The lateral one was noted to be quite hard and we elected to use a peek anchor and this more dense bone.   The lateral suture limbs were loaded into the lateral swivel lock and the medial  sutures were loaded into the medial swivel lock.  These were secured into the bone and had excellent purchase.  Next to the free limbs of the suture tapes as well as the free limbs of the suture anchor #2 FiberWire were then ran  back into the tendon in a horizontal mattress configuration and tied down to completely secure the tendon to bone interface.  This was repeated on both the medial and lateral side.   We then moved to close the retinacular tissue.  This was performed with a #2 FiberWire and a running locking fashion.  Following complete closure of the extensor mechanism and the retinaculum I was able to  passively flex the knee to about 80 degrees with no gapping noted at the tendon bone interface.   The wounds were irrigated copiously.  The deep fat layer was closed with a running 0 Vicryl.  Deep dermal tissue was closed with interrupted 2-0 Vicryl.  Skin was reapproximated and closed with 3-0 monocryl subcuticular.  The wounds were also injected.  Standard sterile bandages were applied.  Patient was placed in a Bledsoe style hinged knee brace locked in full extension. The patient was awakened and returned to the PACU in stable and satisfactory condition. There were no complications.      Disposition:   Patient was transferred to PACU in stable condition.  He will be 100% weightbearing to the right lower extremity with the leg locked in full extension.  He will be placed in observation given his complex medical morbidities to assure that he is stable overnight.  We will likely discharge him home tomorrow.  He will follow-up in my office in 2 to 3 weeks for wound check and staple removal.

## 2021-08-09 NOTE — H&P (Signed)
ORTHOPAEDIC H&P  REQUESTING PHYSICIAN: Nicholes Stairs, MD  PCP:  [name], MD  Chief Complaint: Left knee pain  HPI: Thomas [name] is a 75 y.o. male who complains of inability to extend his left knee following a ground-level fall at his son's house.  He was noted in the emergency department to have a complete rupture of his quadriceps tendon.  He is indicated for open quadriceps tendon repair based on the lack of extensor mechanism.  He has no new complaints at this time.  We have previously evaluated him in the office last week for the surgery.  Past Medical History:  Diagnosis Date   Arthritis    Bilateral inguinal hernia    BPH (benign prostatic hyperplasia)    Cancer (HCC)    prostate cancer; per patient being followed by Dr [name] at [facility] urology ; currently no chemotherapy    Heart murmur    Mitral regurgitation    Mitral valve prolapse    now seeing [name]    Pneumonia    At age 9   Prostate cancer [facility])    S/P minimally-invasive mitral valve repair 01/24/2021   Complex valvuloplasty including quadrangular resection of posterior leaflet with 32 mm Sorin Memo 4D ring annuloplasty via right mini thoracotomy result   Past Surgical History:  Procedure Laterality Date   BUBBLE STUDY  11/23/2020   Procedure: BUBBLE STUDY;  Surgeon: [name], MD;  Location: [place];  Service: Cardiovascular;;   CARDIAC CATHETERIZATION     FEMORAL HERNIA REPAIR Right 07/22/2017   Procedure: HERNIA REPAIR FEMORAL;  Surgeon: Jackolyn Confer, MD;  [facility];  Service: General;  Laterality: Right;   HERNIA REPAIR     INGUINAL HERNIA REPAIR N/A 07/22/2017   Procedure: LAPAROSCOPIC BILATERAL INGUINAL HERNIA REPAIR WITH MESH;  Surgeon: Jackolyn Confer, MD;  [facility];  Service: General;  Laterality: N/A;   INSERTION OF MESH Bilateral 07/22/2017   Procedure: INSERTION OF MESH;  Surgeon: Jackolyn Confer, MD;  [facility];  Service:  General;  Laterality: Bilateral;   MITRAL VALVE REPAIR Right 01/24/2021   Procedure: MINIMALLY INVASIVE MITRAL VALVE REPAIR (MVR) USING 4D MEMO RING SIZE 32MM;  Surgeon: Rexene Alberts, MD;  Location: [place];  Service: Open Heart Surgery;  Laterality: Right;   PROSTATE BIOPSY     RIGHT/LEFT HEART CATH AND CORONARY ANGIOGRAPHY N/A 12/23/2020   Procedure: RIGHT/LEFT HEART CATH AND CORONARY ANGIOGRAPHY;  Surgeon: Sherren Mocha, MD;  Location: [place] [facility];  Service: Cardiovascular;  Laterality: N/A;   TEE WITHOUT CARDIOVERSION N/A 11/23/2020   Procedure: TRANSESOPHAGEAL ECHOCARDIOGRAM (TEE);  Surgeon: [name], MD;  Location: [place];  Service: Cardiovascular;  Laterality: N/A;   TEE WITHOUT CARDIOVERSION N/A 01/24/2021   Procedure: TRANSESOPHAGEAL ECHOCARDIOGRAM (TEE);  Surgeon: Rexene Alberts, MD;  Location: [place];  Service: Open Heart Surgery;  Laterality: N/A;   TONSILLECTOMY AND ADENOIDECTOMY     VASECTOMY     Social History   Socioeconomic History   Marital status: Divorced    Spouse name: Not on file   Number of children: Not on file   Years of education: Not on file   Highest education level: Not on file  Occupational History   Not on file  Tobacco Use   Smoking status: Never   Smokeless tobacco: Never  Vaping Use   Vaping Use: Never used  Substance and Sexual Activity   Alcohol use: Not Currently    Comment:  seldom    Drug use: No   Sexual activity: Not on file  Other Topics Concern   Not on file  Social History Narrative   Not on file   Social Determinants of Health   Financial Resource Strain: Not on file  Food Insecurity: Not on file  Transportation Needs: Not on file  Physical Activity: Not on file  Stress: Not on file  Social Connections: Not on file   History reviewed. No pertinent family history. No Known Allergies Prior to Admission medications   Medication Sig Start Date End Date Taking? Authorizing Provider  acetaminophen  (TYLENOL) 500 MG tablet Take 1,000 mg by mouth every 6 (six) hours as needed (pain.).   Yes [provider]  Ascorbic Acid (VITAMIN C) 1000 MG tablet Take 1,000 mg by mouth in the morning.   Yes [provider]  aspirin EC 81 MG tablet Take 81 mg by mouth in the morning. Swallow whole.   Yes [provider]  Calcium Carb-Cholecalciferol (CALCIUM 600/VITAMIN D3) 600-800 MG-UNIT TABS Take 1 tablet by mouth in the morning and at bedtime.   Yes [provider]  cefdinir (OMNICEF) 300 MG capsule Take 300 mg by mouth 2 (two) times daily. 08/01/21  Yes [provider]  HYDROcodone-acetaminophen (NORCO/VICODIN) 5-325 MG tablet Take 1 tablet by mouth every 4 (four) hours as needed. 08/03/21  Yes Tacy Learn, PA-C  Magnesium Oxide 250 MG TABS Take 250 mg by mouth at bedtime.   Yes [provider]  metoprolol tartrate (LOPRESSOR) 25 MG tablet Take 0.5 tablets (12.5 mg total) by mouth 2 (two) times daily. 02/22/21  Yes [name], MD  Multiple Vitamins-Minerals (MULTIVITAMIN WITH MINERALS) tablet Take 1 tablet by mouth in the morning.   Yes [provider]  HYDROcodone-acetaminophen (NORCO/VICODIN) 5-325 MG tablet Take 1 tablet by mouth every 4 (four) hours as needed. 08/03/21   Suella Broad A, PA-C   No results found.  Positive ROS: All other systems have been reviewed and were otherwise negative with the exception of those mentioned in the HPI and as above.  Physical Exam: General: Alert, no acute distress Cardiovascular: No pedal edema Respiratory: No cyanosis, no use of accessory musculature GI: No organomegaly, abdomen is soft and non-tender Skin: No lesions in the area of chief complaint Neurologic: Sensation intact distally Psychiatric: Patient is competent for consent with normal mood and affect Lymphatic: No axillary or cervical lymphadenopathy  MUSCULOSKELETAL:  Left lower extremity is warm and well-perfused.  No open wounds  or skin lesions.  He has some bruising around the medial knee joint with a moderate hemarthrosis.  Palpable defect at the quad tendon.  Distally neurovascular intact.  Assessment: Left quadriceps tendon rupture  Plan: Plan for open repair today of the quadriceps tendon with suture anchors and sutures in the tendon.  We again discussed the risk of bleeding, infection, damage to surrounding nerves and vessels, stiffness, failure of repair, need for revision surgery, DVT, and the risk of anesthesia.  He has provided informed consent.  -Our plan will be for admission postoperatively for observation and pain management and therapy.  We will plan on hopefully discharge home tomorrow.    Nicholes Stairs, MD Cell [phone]    08/09/2021 2:56 PM

## 2021-08-09 NOTE — Anesthesia Postprocedure Evaluation (Signed)
Anesthesia Post Note  Patient: Thomas [name]  Procedure(s) Performed: OPEN LEFT KNEE QUAD REPAIR (Left: Knee)     Patient location during evaluation: PACU Anesthesia Type: General Level of consciousness: awake and alert Pain management: pain level controlled Vital Signs Assessment: post-procedure vital signs reviewed and stable Respiratory status: spontaneous breathing, nonlabored ventilation and respiratory function stable Cardiovascular status: blood pressure returned to baseline and stable Postop Assessment: no apparent nausea or vomiting Anesthetic complications: no   No notable events documented.  Last Vitals:  Vitals:   08/09/21 1815 08/09/21 1842  BP: (!) 163/92 137/82  Pulse: 96 86  Resp: 16 16  Temp:  36.8 C  SpO2: 98% 97%    Last Pain:  Vitals:   08/09/21 1842  TempSrc: Oral  PainSc: 0-No pain                 Lynda Rainwater

## 2021-08-09 NOTE — Brief Op Note (Signed)
08/09/2021  4:20 PM  PATIENT:  Thomas [name]  75 y.o. male  PRE-OPERATIVE DIAGNOSIS:  Left quad tendon rupture  POST-OPERATIVE DIAGNOSIS:  Left quad tendon rupture  PROCEDURE:  Procedure(s): OPEN LEFT KNEE QUAD REPAIR (Left)  SURGEON:  Surgeon(s) and Role:    * Nicholes Stairs, MD - Primary  PHYSICIAN ASSISTANT: Jonelle Sidle, PA-C  ANESTHESIA:   regional and general  EBL:  20 mL   BLOOD ADMINISTERED:none  DRAINS: none   LOCAL MEDICATIONS USED:  NONE  SPECIMEN:  No Specimen  DISPOSITION OF SPECIMEN:  PATHOLOGY  COUNTS:  YES  TOURNIQUET:  * Missing tourniquet times found for documented tourniquets in log: [id number] *  DICTATION: .Note written in EPIC  PLAN OF CARE: Admit for overnight observation  PATIENT DISPOSITION:  PACU - hemodynamically stable.   Delay start of Pharmacological VTE agent (>24hrs) due to surgical blood loss or risk of bleeding: not applicable

## 2021-08-09 NOTE — Anesthesia Procedure Notes (Signed)
Anesthesia Regional Block: Femoral nerve block   Pre-Anesthetic Checklist: , timeout performed,  Correct Patient, Correct Site, Correct Laterality,  Correct Procedure, Correct Position, site marked,  Risks and benefits discussed,  Surgical consent,  Pre-op evaluation,  At surgeon's request and post-op pain management  Laterality: Left  Prep: chloraprep       Needles:  Injection technique: Single-shot  Needle Type: Stimiplex     Needle Length: 9cm  Needle Gauge: 21     Additional Needles:   Procedures:,,,, ultrasound used (permanent image in chart),,    Narrative:  Start time: 08/09/2021 2:14 PM End time: 08/09/2021 2:19 PM Injection made incrementally with aspirations every 5 mL.  Performed by: Personally  Anesthesiologist: Lynda Rainwater, MD

## 2021-08-09 NOTE — Discharge Instructions (Signed)
-   Maintain your leg in the brace at all times.  This should remain locked in extension until opened up by your therapist.  -You may remove the brace for showering but keep the leg completely straight and do not bear weight when you are not wearing the brace.  Maintain postoperative bandages for 3 days.  You may then remove.  The bandage against the skin is a waterproof bandage that can stay in place for 2 weeks.  He can shower with this in place but do not submerge underwater.  -Apply ice aggressively to the left knee for 20 to 30 minutes out of each hour that you are awake and able.  Use around-the-clock as often as possible.  -For mild to moderate pain use Tylenol as needed.  For breakthrough pain use the Norco as directed.  -For the prevention of blood clots use 81 mg of aspirin twice per day x6 weeks.  -Return to see Dr. Stann Mainland in the office in 2 weeks for routine follow-up care.

## 2021-08-09 NOTE — Plan of Care (Signed)

## 2021-08-10 DIAGNOSIS — S76192A Other specified injury of left quadriceps muscle, fascia and tendon, initial encounter: Secondary | ICD-10-CM | POA: Diagnosis not present

## 2021-08-10 NOTE — Plan of Care (Signed)

## 2021-08-10 NOTE — Evaluation (Signed)
Physical Therapy Evaluation Patient Details Name: Thomas [name] MRN: [id number] DOB: 02-21-1946 Today's Date: 08/10/2021  History of Present Illness  Pt s/p L quad tendon repair and with hx of MVP  Clinical Impression  Pt s/p L quad tendon repair and presents with functional mobility limitations 2* decreased L LE strength/ROM and post op pain.  Pt should progress to dc home with assist of family/friends.     Recommendations for follow up therapy are one component of a multi-disciplinary discharge planning process, led by the attending physician.  Recommendations may be updated based on patient status, additional functional criteria and insurance authorization.  Follow Up Recommendations Follow surgeon's recommendation for DC plan and follow-up therapies    Equipment Recommendations  None recommended by PT    Recommendations for Other Services       Precautions / Restrictions Precautions Precautions: Fall;Other (comment) Precaution Comments: NO KNEE ROM Required Braces or Orthoses: Other Brace Other Brace: Bledsoe in place L LE Restrictions Weight Bearing Restrictions: Yes LLE Weight Bearing: Weight bearing as tolerated      Mobility  Bed Mobility Overal bed mobility: Needs Assistance Bed Mobility: Supine to Sit     Supine to sit: Min guard;Supervision     General bed mobility comments: Pt self assisting L LE with belt    Transfers Overall transfer level: Needs assistance Equipment used: Rolling walker (2 wheeled) Transfers: Sit to/from Stand Sit to Stand: Min assist;Min guard         General transfer comment: cues for LE management and use of UEs to self assist  Ambulation/Gait Ambulation/Gait assistance: Min assist;Min guard Gait Distance (Feet): 140 Feet Assistive device: Rolling walker (2 wheeled) Gait Pattern/deviations: Step-to pattern;Step-through pattern;Decreased step length - right;Decreased step length - left;Shuffle;Trunk flexed Gait  velocity: decr   General Gait Details: cues for posture, position from RW and initial sequence  Stairs            Wheelchair Mobility    Modified Rankin (Stroke Patients Only)       Balance Overall balance assessment: Needs assistance Sitting-balance support: No upper extremity supported;Feet supported Sitting balance-Leahy Scale: Good     Standing balance support: Bilateral upper extremity supported Standing balance-Leahy Scale: Poor                               Pertinent Vitals/Pain Pain Assessment: 0-10 Pain Score: 3  Pain Location: L knee Pain Descriptors / Indicators: Aching;Sore Pain Intervention(s): Monitored during session;Limited activity within patient's tolerance    Home Living Family/patient expects to be discharged to:: Private residence Living Arrangements: Non-relatives/Friends Available Help at Discharge: Family Type of Home: House Home Access: Stairs to enter Entrance Stairs-Rails: Right Entrance Stairs-Number of Steps: flight Home Layout: One level Home Equipment: Environmental consultant - 2 wheels;Cane - single point      Prior Function Level of Independence: Independent with assistive device(s)         Comments: using RW since injury ~ 1 week ago     Hand Dominance        Extremity/Trunk Assessment   Upper Extremity Assessment Upper Extremity Assessment: Overall WFL for tasks assessed    Lower Extremity Assessment Lower Extremity Assessment: LLE deficits/detail LLE Deficits / Details: Bledsoe in place LLE: Unable to fully assess due to immobilization    Cervical / Trunk Assessment Cervical / Trunk Assessment: Normal  Communication   Communication: No difficulties  Cognition Arousal/Alertness: Awake/alert Behavior During Therapy:  WFL for tasks assessed/performed Overall Cognitive Status: Within Functional Limits for tasks assessed                                        General Comments      Exercises  General Exercises - Lower Extremity Ankle Circles/Pumps: AROM;Both;15 reps;Supine   Assessment/Plan    PT Assessment Patient needs continued PT services  PT Problem List Decreased strength;Decreased range of motion;Decreased activity tolerance;Decreased balance;Decreased mobility;Decreased knowledge of use of DME;Pain       PT Treatment Interventions DME instruction;Gait training;Stair training;Functional mobility training;Therapeutic activities;Therapeutic exercise;Patient/family education    PT Goals (Current goals can be found in the Care Plan section)  Acute Rehab PT Goals Patient Stated Goal: Regain IND PT Goal Formulation: With patient Time For Goal Achievement: 08/17/21 Potential to Achieve Goals: Good    Frequency 7X/week   Barriers to discharge        Co-evaluation               AM-PAC PT "6 Clicks" Mobility  Outcome Measure Help needed turning from your back to your side while in a flat bed without using bedrails?: A Little Help needed moving from lying on your back to sitting on the side of a flat bed without using bedrails?: A Little Help needed moving to and from a bed to a chair (including a wheelchair)?: A Little Help needed standing up from a chair using your arms (e.g., wheelchair or bedside chair)?: A Little Help needed to walk in hospital room?: A Little Help needed climbing 3-5 steps with a railing? : A Little 6 Click Score: 18    End of Session Equipment Utilized During Treatment: Gait belt;Other (comment) (Bledsoe) Activity Tolerance: Patient tolerated treatment well Patient left: in chair;with call bell/phone within reach;with chair alarm set Nurse Communication: Mobility status PT Visit Diagnosis: Unsteadiness on feet (R26.81);Difficulty in walking, not elsewhere classified (R26.2)    Time: 9326-7124 PT Time Calculation (min) (ACUTE ONLY): 33 min   Charges:   PT Evaluation $PT Eval Low Complexity: 1 Low PT Treatments $Gait Training:  8-22 mins        Debe Coder PT Acute Rehabilitation Services Pager [phone] Office [phone]   Thomas [name] 08/10/2021, 12:53 PM

## 2021-08-10 NOTE — Progress Notes (Signed)
Physical Therapy Treatment Patient Details Name: Thomas [name] MRN: [id number] DOB: 05-Sep-1946 Today's Date: 08/10/2021   History of Present Illness Pt s/p L quad tendon repair and with hx of MVP    PT Comments    Pt progressing well with mobility including navigating stairs and reviewing car transfers.  Pt eager for dc home this date.   Recommendations for follow up therapy are one component of a multi-disciplinary discharge planning process, led by the attending physician.  Recommendations may be updated based on patient status, additional functional criteria and insurance authorization.  Follow Up Recommendations  Follow surgeon's recommendation for DC plan and follow-up therapies     Equipment Recommendations  None recommended by PT    Recommendations for Other Services       Precautions / Restrictions Precautions Precautions: Fall;Other (comment) Precaution Comments: NO KNEE ROM Required Braces or Orthoses: Other Brace Other Brace: Bledsoe in place L LE Restrictions Weight Bearing Restrictions: No LLE Weight Bearing: Weight bearing as tolerated     Mobility  Bed Mobility               General bed mobility comments: Pt up in chair and requests back to same    Transfers Overall transfer level: Needs assistance Equipment used: Rolling walker (2 wheeled) Transfers: Sit to/from Stand Sit to Stand: Supervision         General transfer comment: cues for LE management and use of UEs to self assist  Ambulation/Gait Ambulation/Gait assistance: Supervision Gait Distance (Feet): 200 Feet Assistive device: Rolling walker (2 wheeled) Gait Pattern/deviations: Step-to pattern;Step-through pattern;Decreased step length - right;Decreased step length - left;Shuffle;Trunk flexed Gait velocity: decr   General Gait Details: cues for posture, position from RW and initial sequence   Stairs Stairs: Yes     Number of Stairs: 6 General stair comments: 3 steps  twice with rail and cane and cues for sequence and cane placement   Wheelchair Mobility    Modified Rankin (Stroke Patients Only)       Balance Overall balance assessment: Needs assistance Sitting-balance support: No upper extremity supported;Feet supported Sitting balance-Leahy Scale: Good     Standing balance support: No upper extremity supported Standing balance-Leahy Scale: Fair                              Cognition Arousal/Alertness: Awake/alert Behavior During Therapy: WFL for tasks assessed/performed Overall Cognitive Status: Within Functional Limits for tasks assessed                                        Exercises      General Comments        Pertinent Vitals/Pain Pain Assessment: 0-10 Pain Score: 3  Pain Location: L knee Pain Descriptors / Indicators: Aching;Sore Pain Intervention(s): Limited activity within patient's tolerance;Monitored during session    Home Living                      Prior Function            PT Goals (current goals can now be found in the care plan section) Acute Rehab PT Goals Patient Stated Goal: Regain IND PT Goal Formulation: With patient Time For Goal Achievement: 08/17/21 Potential to Achieve Goals: Good Progress towards PT goals: Progressing toward goals    Frequency    7X/week  PT Plan Current plan remains appropriate    Co-evaluation              AM-PAC PT "6 Clicks" Mobility   Outcome Measure  Help needed turning from your back to your side while in a flat bed without using bedrails?: A Little Help needed moving from lying on your back to sitting on the side of a flat bed without using bedrails?: A Little Help needed moving to and from a bed to a chair (including a wheelchair)?: A Little Help needed standing up from a chair using your arms (e.g., wheelchair or bedside chair)?: A Little Help needed to walk in hospital room?: A Little Help needed climbing  3-5 steps with a railing? : A Little 6 Click Score: 18    End of Session Equipment Utilized During Treatment: Gait belt;Other (comment) Activity Tolerance: Patient tolerated treatment well Patient left: in chair;with call bell/phone within reach;with chair alarm set Nurse Communication: Mobility status PT Visit Diagnosis: Unsteadiness on feet (R26.81);Difficulty in walking, not elsewhere classified (R26.2)     Time: 0156-1537 PT Time Calculation (min) (ACUTE ONLY): 36 min  Charges:  $Gait Training: 8-22 mins $Therapeutic Activity: 8-22 mins                     [place] Pager [phone] Office [phone]    Shevawn Langenberg 08/10/2021, 1:55 PM

## 2021-08-10 NOTE — Progress Notes (Signed)
Provided discharge education/instructions, all questions and concerns addressed, Pt not in acute distress, discharged home with belongings accompanied by his brother.

## 2021-08-10 NOTE — Discharge Summary (Signed)
Patient ID: Thomas [name] MRN: [id number] DOB/AGE: 05/02/1946 75 y.o.  Admit date: 08/09/2021 Discharge date:   Primary Diagnosis: Left knee quadriceps Rupture Admission Diagnoses: Status post left knee quadriceps repair Past Medical History:  Diagnosis Date   Arthritis    Bilateral inguinal hernia    BPH (benign prostatic hyperplasia)    Cancer (HCC)    prostate cancer; per patient being followed by Dr [name] at [facility] urology ; currently no chemotherapy    Heart murmur    Mitral regurgitation    Mitral valve prolapse    now seeing [name]    Pneumonia    At age 75   Prostate cancer [facility])    S/P minimally-invasive mitral valve repair 01/24/2021   Complex valvuloplasty including quadrangular resection of posterior leaflet with 32 mm Sorin Memo 4D ring annuloplasty via right mini thoracotomy result   Discharge Diagnoses:   Active Problems:   Rupture of left quadriceps muscle  Estimated body mass index is 24.68 kg/m as calculated from the following:   Height as of this encounter: 5\' 10"  (1.778 m).   Weight as of this encounter: 78 kg.  Procedure:  Procedure(s) (LRB): OPEN LEFT KNEE QUAD REPAIR (Left)   Consults: None  HPI: Thomas [name] Is a 75 year old male who presented to the operating room for repair of a left quadriceps tendon rupture That occurred on 08/03/2021 after a ground-level fall at his son's house.  He was initially seen in the emergency room on 08/03/2021 and shortly after was seen in our office by Dr. Stann Mainland where operative management was indicated.Patient underwent surgery on 08/09/2021 was admitted for postoperative management, physical therapy, and monitoring.   Laboratory Data: Admission on 08/09/2021  Component Date Value Ref Range Status   SARS Coronavirus 2 08/09/2021 NEGATIVE  NEGATIVE Final   Comment: (NOTE) SARS-CoV-2 target nucleic acids are NOT DETECTED.  The SARS-CoV-2 RNA is generally detectable in upper and lower respiratory  specimens during the acute phase of infection. The lowest concentration of SARS-CoV-2 viral copies this assay can detect is 250 copies / mL. A negative result does not preclude SARS-CoV-2 infection and should not be used as the sole basis for treatment or other patient management decisions.  A negative result may occur with improper specimen collection / handling, submission of specimen other than nasopharyngeal swab, presence of viral mutation(s) within the areas targeted by this assay, and inadequate number of viral copies (<250 copies / mL). A negative result must be combined with clinical observations, patient history, and epidemiological information.  Fact Sheet for Patients:   StrictlyIdeas.no  Fact Sheet for Healthcare Providers: BankingDealers.co.za  This test is not yet approved or                           cleared by the [place] FDA and has been authorized for detection and/or diagnosis of SARS-CoV-2 by FDA under an Emergency Use Authorization (EUA).  This EUA will remain in effect (meaning this test can be used) for the duration of the COVID-19 declaration under Section 564(b)(1) of the Act, 21 U.S.C. section 360bbb-3(b)(1), unless the authorization is terminated or revoked sooner.  Performed at [facility], [place] [address], [place], [place] [address]    Sodium 08/09/2021 143  135 - 145 mmol/L Final   Potassium 08/09/2021 4.2  3.5 - 5.1 mmol/L Final   Chloride 08/09/2021 105  98 - 111 mmol/L Final   CO2 08/09/2021 26  22 - 32 mmol/L Final   Glucose, Bld 08/09/2021 94  70 - 99 mg/dL Final   Glucose reference range applies only to samples taken after fasting for at least 8 hours.   BUN 08/09/2021 25 (A) 8 - 23 mg/dL Final   Creatinine, Ser 08/09/2021 0.86  0.61 - 1.24 mg/dL Final   Calcium 08/09/2021 9.6  8.9 - 10.3 mg/dL Final   GFR, Estimated 08/09/2021 >60  >60 mL/min Final   Comment:  (NOTE) Calculated using the CKD-EPI Creatinine Equation (2021)    Anion gap 08/09/2021 12  5 - 15 Final   Performed at [facility], [place] [address], [place], [place] [address]   WBC 08/09/2021 9.8  4.0 - 10.5 K/uL Final   RBC 08/09/2021 4.11 (A) 4.22 - 5.81 MIL/uL Final   Hemoglobin 08/09/2021 13.5  13.0 - 17.0 g/dL Final   HCT 08/09/2021 40.3  39.0 - 52.0 % Final   MCV 08/09/2021 98.1  80.0 - 100.0 fL Final   MCH 08/09/2021 32.8  26.0 - 34.0 pg Final   MCHC 08/09/2021 33.5  30.0 - 36.0 g/dL Final   RDW 08/09/2021 13.3  11.5 - 15.5 % Final   Platelets 08/09/2021 225  150 - 400 K/uL Final   nRBC 08/09/2021 0.0  0.0 - 0.2 % Final   Performed at [facility], [place] [address], [place],  [address]     X-Rays:MR KNEE LEFT WO CONTRAST  Result Date: 08/03/2021 CLINICAL DATA:  Left knee pain after fall this morning. Concern for quadriceps tendon injury. EXAM: MRI OF THE LEFT KNEE WITHOUT CONTRAST TECHNIQUE: Multiplanar, multisequence MR imaging of the knee was performed. No intravenous contrast was administered. COMPARISON:  Left knee x-rays from same day. FINDINGS: MENISCI Medial meniscus:  Degenerated body with small radial tear. Lateral meniscus:  Small radial tear of the body. LIGAMENTS Cruciates:  Intact ACL and PCL. Collaterals: Medial collateral ligament is intact. Lateral collateral ligament complex is intact. CARTILAGE Patellofemoral: High-grade partial and full-thickness cartilage loss over the patella. Medial:  Cartilage degeneration without focal defect. Lateral:  Cartilage degeneration without focal defect. Joint:  Trace suprapatellar joint effusion.  Normal Hoffa's fat. Popliteal Fossa:  No Baker cyst. Intact popliteus tendon. Extensor Mechanism: Complete tear of the distal quadriceps tendon with up to 1.7 cm retraction. Laxity of the patellar tendon. Bones: No acute fracture or dislocation. No suspicious bone lesion. Tiny tricompartmental marginal  osteophytes. Other: Prepatellar soft tissue swelling. IMPRESSION: 1. Complete tear of the distal quadriceps tendon with up to 1.7 cm retraction. 2. Degenerated medial meniscus body with small radial tear. 3. Small radial tear of the lateral meniscus body. 4. Tricompartmental osteoarthritis, worst in the patellofemoral compartment. Electronically Signed   By: Titus Dubin M.D.   On: 08/03/2021 15:06   DG Knee Complete 4 Views Left  Result Date: 08/03/2021 CLINICAL DATA:  Knee pain status post fall. Defect above patella suspicious for tendinous rupture. EXAM: LEFT KNEE - COMPLETE 4+ VIEW COMPARISON:  None. FINDINGS: There is thickening of the quadriceps tendon containing multiple calcifications. The patella also appears low in position. No additional fracture or dislocation. IMPRESSION: Thickening of the distal patellar tendon with inferior positioning of the patella is suspicious for quadriceps tendon rupture. Electronically Signed   By: Miachel Roux M.D.   On: 08/03/2021 11:05    EKG: Orders placed or performed in visit on 02/14/21   EKG 12-Lead     Exam:  Constitutional: General Appearance: healthy-appearing, well-nourished, and  well-developed in hospital bed Level of Distress: no acute distress. Eyes: Lens (normal) clear: both eyes. Head: Head: normocephalic and atraumatic. Lungs: Respiratory effort: no dyspnea. Skin: Inspection and palpation: no rash, lesions Neurologic: Cranial Nerves: grossly intact. Sensation: grossly intact. Psychiatric: Insight: good judgement and insight. Mental Status: normal mood and affect and active and alert. Orientation: to time, place, and person.    Inspection: Dressing clean dry and intact with TROM brace in place in extension, compression stockings  Palpation: No tenderness to the knee.    No calf swelling or tenderness  Distal ankle and foot strength normal Sensation is intact to light touch Extremity is warm and well perfused     Hospital  Course: Thomas [name] is a 75 y.o. who was admitted to Hospital. They were brought to the operating room on 08/09/2021 and underwent Procedure(s): OPEN LEFT KNEE QUAD REPAIR.  Patient tolerated the procedure well and was later transferred to the recovery room and then to the orthopaedic floor for postoperative care.  They were given PO and IV analgesics for pain control following their surgery.  They were given 24 hours of postoperative antibiotics of  Anti-infectives (From admission, onward)    Start     Dose/Rate Route Frequency Ordered Stop   08/10/21 0600  ceFAZolin (ANCEF) IVPB 2g/100 mL premix  Status:  Discontinued        2 g 200 mL/hr over 30 Minutes Intravenous On call to O.R. 08/09/21 1315 08/09/21 1318   08/09/21 2300  ceFAZolin (ANCEF) IVPB 1 g/50 mL premix        1 g 100 mL/hr over 30 Minutes Intravenous Every 6 hours 08/09/21 1838 08/10/21 1030   08/09/21 1330  ceFAZolin (ANCEF) IVPB 2g/100 mL premix        2 g 200 mL/hr over 30 Minutes Intravenous On call to O.R. 08/09/21 1318 08/09/21 1554      and started on DVT prophylaxis in the form of Lovenox.   PT Was ordered for evaluation instruction for safe discharge.Discharge planning consulted to help with postop disposition and equipment needs.  Patient had a uneventful night on the evening of surgery.  They started to get up OOB with therapy on day one. Patient progressed well with physical therapy  was ready for discharge.  Patient was seen in rounds and was ready to go home.   Diet: Regular diet Activity:WBAT While locked in extension in TROM. NWB out brace. No knee ROM.  Follow-up:in 2 weeks Disposition - Home Discharged Condition: good    Allergies as of 08/10/2021   No Known Allergies      Medication List     TAKE these medications    acetaminophen 500 MG tablet Commonly known as: TYLENOL Take 1,000 mg by mouth every 6 (six) hours as needed (pain.).   aspirin EC 81 MG tablet Take 81 mg by mouth in the  morning. Swallow whole.   Calcium 600/Vitamin D3 600-800 MG-UNIT Tabs Generic drug: Calcium Carb-Cholecalciferol Take 1 tablet by mouth in the morning and at bedtime.   cefdinir 300 MG capsule Commonly known as: OMNICEF Take 300 mg by mouth 2 (two) times daily.   HYDROcodone-acetaminophen 5-325 MG tablet Commonly known as: NORCO/VICODIN Take 1-2 tablets by mouth every 4 (four) hours as needed for moderate pain or severe pain. What changed:  how much to take reasons to take this Another medication with the same name was removed. Continue taking this medication, and follow the directions you see here.  Magnesium Oxide 250 MG Tabs Take 250 mg by mouth at bedtime.   metoprolol tartrate 25 MG tablet Commonly known as: LOPRESSOR Take 0.5 tablets (12.5 mg total) by mouth 2 (two) times daily.   multivitamin with minerals tablet Take 1 tablet by mouth in the morning.   ondansetron 4 MG tablet Commonly known as: Zofran Take 1 tablet (4 mg total) by mouth daily as needed for nausea or vomiting.   vitamin C 1000 MG tablet Take 1,000 mg by mouth in the morning.        Follow-up Information     Nicholes Stairs, MD Follow up in 2 week(s).   Specialty: Orthopedic Surgery Why: For wound re-check Contact information: [address] [place] [address] [phone]                 Signed: Jonelle Sidle PA-C  Orthopaedic Surgery 08/10/2021, 10:36 AM

## 2021-08-14 ENCOUNTER — Telehealth: Payer: Self-pay

## 2021-08-14 NOTE — Telephone Encounter (Signed)
Left message for patient to call back.  Re: [facility]

## 2021-08-15 ENCOUNTER — Encounter (HOSPITAL_COMMUNITY): Payer: Self-pay | Admitting: Orthopedic Surgery

## 2021-08-16 ENCOUNTER — Telehealth: Payer: Self-pay

## 2021-08-16 NOTE — Telephone Encounter (Signed)
RN left voicemail requesting patient to call back Re: PMDC on 10/25.   RN mailed intake PMDC packet.  Will continue to follow up to make contact with patient.

## 2021-08-17 ENCOUNTER — Telehealth: Payer: Self-pay

## 2021-08-17 NOTE — Progress Notes (Signed)
I called pt to introduce myself as the Prostate Nurse Navigator and the Coordinator of the Prostate [place].   1. I confirmed with the patient he is aware of his referral to the clinic 10/25, arriving @ 12:30 pm.    2. I discussed the format of the clinic and the physicians he will be seeing that day.   3. I discussed where the clinic is located and how to contact me.   4. I confirmed his address and informed him I would be mailing a packet of information and forms to be completed. I asked him to bring them with him the day of his appointment.    He voiced understanding of the above. I asked him to call me if he has any questions or concerns regarding his appointments or the forms he needs to complete.

## 2021-08-23 DIAGNOSIS — M25562 Pain in left knee: Secondary | ICD-10-CM | POA: Diagnosis not present

## 2021-08-25 ENCOUNTER — Ambulatory Visit (HOSPITAL_COMMUNITY)
Admission: RE | Admit: 2021-08-25 | Discharge: 2021-08-25 | Disposition: A | Discharge status: Home / Self Care | Payer: PPO | Source: Ambulatory Visit | Attending: Urology | Admitting: Urology

## 2021-08-25 ENCOUNTER — Other Ambulatory Visit: Payer: Self-pay

## 2021-08-25 DIAGNOSIS — C61 Malignant neoplasm of prostate: Secondary | ICD-10-CM | POA: Diagnosis not present

## 2021-08-25 DIAGNOSIS — D3502 Benign neoplasm of left adrenal gland: Secondary | ICD-10-CM | POA: Diagnosis not present

## 2021-08-25 DIAGNOSIS — J9811 Atelectasis: Secondary | ICD-10-CM | POA: Diagnosis not present

## 2021-08-25 DIAGNOSIS — K449 Diaphragmatic hernia without obstruction or gangrene: Secondary | ICD-10-CM | POA: Diagnosis not present

## 2021-08-25 MED ORDER — PIFLIFOLASTAT F 18 (PYLARIFY) INJECTION
9.0000 | Freq: Once | INTRAVENOUS | Status: AC
  Administered 2021-08-25: 9.49 via INTRAVENOUS

## 2021-08-26 DIAGNOSIS — Z4789 Encounter for other orthopedic aftercare: Secondary | ICD-10-CM | POA: Diagnosis not present

## 2021-08-29 ENCOUNTER — Ambulatory Visit
Admission: RE | Admit: 2021-08-29 | Discharge: 2021-08-29 | Disposition: A | Discharge status: Home / Self Care | Payer: PPO | Source: Ambulatory Visit | Attending: Radiation Oncology | Admitting: Radiation Oncology

## 2021-08-29 ENCOUNTER — Ambulatory Visit: Payer: PPO | Admitting: Radiation Oncology

## 2021-08-29 ENCOUNTER — Inpatient Hospital Stay: Payer: PPO | Attending: Oncology | Admitting: Oncology

## 2021-08-29 ENCOUNTER — Other Ambulatory Visit: Payer: Self-pay

## 2021-08-29 ENCOUNTER — Encounter: Payer: Self-pay | Admitting: General Practice

## 2021-08-29 ENCOUNTER — Ambulatory Visit (HOSPITAL_BASED_OUTPATIENT_CLINIC_OR_DEPARTMENT_OTHER): Payer: PPO | Admitting: Genetic Counselor

## 2021-08-29 DIAGNOSIS — Z952 Presence of prosthetic heart valve: Secondary | ICD-10-CM | POA: Diagnosis not present

## 2021-08-29 DIAGNOSIS — C61 Malignant neoplasm of prostate: Secondary | ICD-10-CM

## 2021-08-29 DIAGNOSIS — I341 Nonrheumatic mitral (valve) prolapse: Secondary | ICD-10-CM

## 2021-08-29 DIAGNOSIS — N401 Enlarged prostate with lower urinary tract symptoms: Secondary | ICD-10-CM | POA: Diagnosis not present

## 2021-08-29 DIAGNOSIS — R3916 Straining to void: Secondary | ICD-10-CM | POA: Diagnosis not present

## 2021-08-29 NOTE — Progress Notes (Addendum)
Radiation Oncology         [phone] ________________________________  Multidisciplinary Prostate Cancer Clinic  Initial Radiation Oncology Consultation  Name: Thomas [name] MRN: [id number]  Date: 08/29/2021  DOB: 1946-03-14  CC:[name], MD  Raynelle Bring, MD   REFERRING PHYSICIAN: Raynelle Bring, MD  DIAGNOSIS: 75 y.o. gentleman with stage T1c adenocarcinoma of the prostate with a Gleason's score of 3+5 and a PSA of 9.48    ICD-10-CM   1. Malignant neoplasm of prostate ([place])  C61       HISTORY OF PRESENT ILLNESS::Thomas [name] is a 75 y.o. gentleman. He has been followed by [name] since at least 2013 for an elevated PSA. He was initially diagnosed with low risk, T1c, Gleason 3+3 prostate cancer in 1 of 16 cores on biopsy in May 2014 with a PSA of 5.37 at that time.  He appropriately elected to proceed with active surveillance and subsequent surveillance biopsies since that time had continued to show minimal Gleason 3+3 disease, if any disease was seen.  More recently, he underwent surveillance prostate MRI on 03/07/21 showing a 2.5 cm PI-RADS 3 lesion in the transition zone in the left apex, without any evidence of extracapsular extension or metastatic disease in the pelvis.      His most recent PSA in 05/2021 was 9.48. The surveillance biopsy was delayed due to the patient being on anticoagulation for several months following a recent mitral valve repair but he proceeded to MRI fusion biopsy of the prostate on 08/01/21.  The prostate volume measured 104 cc.  Out of 16 core biopsies, 4 were positive.  The maximum Gleason score was 3+5, and this was seen in one of 4 samples from the MRI ROI lesion. Additionally, Gleason 3+4 was seen in 2 of 4 samples from the MRI ROI lesion and a small focus of Gleason 3+3 was noted in the left mid.    He underwent PSMA scan for disease staging on 08/25/21 which confirmed prostate cancer in the left prostate gland without any  signs to indicate metastatic disease.    The patient reviewed the biopsy results with his urologist and he has kindly been referred today to the multidisciplinary prostate cancer clinic for presentation of pathology and radiology studies in our conference for discussion of potential radiation treatment options and clinical evaluation.  PREVIOUS RADIATION THERAPY: No  PAST MEDICAL HISTORY:  has a past medical history of Arthritis, Bilateral inguinal hernia, BPH (benign prostatic hyperplasia), Cancer ([place]), Heart murmur, Mitral regurgitation, Mitral valve prolapse, Pneumonia, Prostate cancer ([place]), and S/P minimally-invasive mitral valve repair (01/24/2021).    PAST SURGICAL HISTORY: Past Surgical History:  Procedure Laterality Date   BUBBLE STUDY  11/23/2020   Procedure: BUBBLE STUDY;  Surgeon: [name], MD;  Location: [facility];  Service: Cardiovascular;;   CARDIAC CATHETERIZATION     FEMORAL HERNIA REPAIR Right 07/22/2017   Procedure: HERNIA REPAIR FEMORAL;  Surgeon: Jackolyn Confer, MD;  Location: [facility];  Service: General;  Laterality: Right;   HERNIA REPAIR     INGUINAL HERNIA REPAIR N/A 07/22/2017   Procedure: LAPAROSCOPIC BILATERAL INGUINAL HERNIA REPAIR WITH MESH;  Surgeon: Jackolyn Confer, MD;  Location: [facility];  Service: General;  Laterality: N/A;   INSERTION OF MESH Bilateral 07/22/2017   Procedure: INSERTION OF MESH;  Surgeon: Jackolyn Confer, MD;  Location: [facility];  Service: General;  Laterality: Bilateral;   MITRAL VALVE REPAIR Right 01/24/2021   Procedure: MINIMALLY INVASIVE MITRAL VALVE REPAIR (MVR) USING 4D  MEMO RING SIZE 32MM;  Surgeon: Rexene Alberts, MD;  Location: [facility];  Service: Open Heart Surgery;  Laterality: Right;   PROSTATE BIOPSY     QUADRICEPS TENDON REPAIR Left 08/09/2021   Procedure: OPEN LEFT KNEE QUAD REPAIR;  Surgeon: Nicholes Stairs, MD;  Location: [facility];  Service: Orthopedics;  Laterality: Left;   RIGHT/LEFT HEART CATH AND  CORONARY ANGIOGRAPHY N/A 12/23/2020   Procedure: RIGHT/LEFT HEART CATH AND CORONARY ANGIOGRAPHY;  Surgeon: Sherren Mocha, MD;  Location: [facility];  Service: Cardiovascular;  Laterality: N/A;   TEE WITHOUT CARDIOVERSION N/A 11/23/2020   Procedure: TRANSESOPHAGEAL ECHOCARDIOGRAM (TEE);  Surgeon: [name], MD;  Location: [facility];  Service: Cardiovascular;  Laterality: N/A;   TEE WITHOUT CARDIOVERSION N/A 01/24/2021   Procedure: TRANSESOPHAGEAL ECHOCARDIOGRAM (TEE);  Surgeon: Rexene Alberts, MD;  Location: [facility];  Service: Open Heart Surgery;  Laterality: N/A;   TONSILLECTOMY AND ADENOIDECTOMY     VASECTOMY      FAMILY HISTORY: family history is not on file.  SOCIAL HISTORY:  reports that he has never smoked. He has never used smokeless tobacco. He reports that he does not currently use alcohol. He reports that he does not use drugs.  ALLERGIES: Patient has no known allergies.  MEDICATIONS:  Current Outpatient Medications  Medication Sig Dispense Refill   acetaminophen (TYLENOL) 500 MG tablet Take 1,000 mg by mouth every 6 (six) hours as needed (pain.).     Ascorbic Acid (VITAMIN C) 1000 MG tablet Take 1,000 mg by mouth in the morning.     aspirin EC 81 MG tablet Take 81 mg by mouth in the morning. Swallow whole.     Calcium Carb-Cholecalciferol (CALCIUM 600/VITAMIN D3) 600-800 MG-UNIT TABS Take 1 tablet by mouth in the morning and at bedtime.     cefdinir (OMNICEF) 300 MG capsule Take 300 mg by mouth 2 (two) times daily.     finasteride (PROSCAR) 5 MG tablet finasteride 5 mg tablet     HYDROcodone-acetaminophen (NORCO/VICODIN) 5-325 MG tablet Take 1-2 tablets by mouth every 4 (four) hours as needed for moderate pain or severe pain. 25 tablet 0   Magnesium Oxide 250 MG TABS Take 250 mg by mouth at bedtime.     metoprolol tartrate (LOPRESSOR) 25 MG tablet Take 0.5 tablets (12.5 mg total) by mouth 2 (two) times daily. 90 tablet 3   Multiple Vitamins-Minerals  (MULTIVITAMIN WITH MINERALS) tablet Take 1 tablet by mouth in the morning.     ondansetron (ZOFRAN) 4 MG tablet Take 1 tablet (4 mg total) by mouth daily as needed for nausea or vomiting. 30 tablet 1   No current facility-administered medications for this encounter.    REVIEW OF SYSTEMS:  On review of systems, the patient reports that he is doing well overall. He denies any chest pain, shortness of breath, cough, fevers, chills, night sweats, unintended weight changes. He denies any bowel disturbances, and denies abdominal pain, nausea or vomiting. He denies any new musculoskeletal or joint aches or pains. His IPSS was 31, indicating severe urinary symptoms but he has been unable to tolerate alpha blockers or 5-ARI's due to unpleasant retrograde ejaculation. He experienced urinary retention following his mitral valve repair 01/2021, requiring catheter placement but this has since been removed and he has recovered normal urinary function but with persistent severe LUTS.  His SHIM was 19, indicating he has mild erectile dysfunction. A complete review of systems is obtained and is otherwise negative.   PHYSICAL  EXAM:  Wt Readings from Last 3 Encounters:  08/09/21 172 lb (78 kg)  06/09/21 177 lb 6.4 oz (80.5 kg)  02/20/21 183 lb 6.4 oz (83.2 kg)   Temp Readings from Last 3 Encounters:  08/10/21 98.7 F (37.1 C) (Oral)  08/03/21 99.1 F (37.3 C) (Oral)  02/20/21 98.4 F (36.9 C) (Skin)   BP Readings from Last 3 Encounters:  08/10/21 106/72  08/03/21 129/89  06/09/21 110/68   Pulse Readings from Last 3 Encounters:  08/10/21 75  08/03/21 73  06/09/21 74    /10  In general this is a well appearing Caucasian male in no acute distress. He's alert and oriented x4 and appropriate throughout the examination. Cardiopulmonary assessment is negative for acute distress and he exhibits normal effort.    KPS = 90  100 - Normal; no complaints; no evidence of disease. 90   - Able to carry on normal  activity; minor signs or symptoms of disease. 80   - Normal activity with effort; some signs or symptoms of disease. 53   - Cares for self; unable to carry on normal activity or to do active work. 60   - Requires occasional assistance, but is able to care for most of his personal needs. 50   - Requires considerable assistance and frequent medical care. 17   - Disabled; requires special care and assistance. 70   - Severely disabled; hospital admission is indicated although death not imminent. 5   - Very sick; hospital admission necessary; active supportive treatment necessary. 10   - Moribund; fatal processes progressing rapidly. 0     - Dead  Karnofsky DA, Abelmann [place], Craver LS and Burchenal [facility] [phone]) The use of the nitrogen mustards in the palliative treatment of carcinoma: with particular reference to bronchogenic carcinoma Cancer 1 634-56   LABORATORY DATA:  Lab Results  Component Value Date   WBC 9.8 08/09/2021   HGB 13.5 08/09/2021   HCT 40.3 08/09/2021   MCV 98.1 08/09/2021   PLT 225 08/09/2021   Lab Results  Component Value Date   NA 143 08/09/2021   K 4.2 08/09/2021   CL 105 08/09/2021   CO2 26 08/09/2021   Lab Results  Component Value Date   ALT 23 01/20/2021   AST 24 01/20/2021   ALKPHOS 43 01/20/2021   BILITOT 0.9 01/20/2021     RADIOGRAPHY: MR KNEE LEFT WO CONTRAST  Result Date: 08/03/2021 CLINICAL DATA:  Left knee pain after fall this morning. Concern for quadriceps tendon injury. EXAM: MRI OF THE LEFT KNEE WITHOUT CONTRAST TECHNIQUE: Multiplanar, multisequence MR imaging of the knee was performed. No intravenous contrast was administered. COMPARISON:  Left knee x-rays from same day. FINDINGS: MENISCI Medial meniscus:  Degenerated body with small radial tear. Lateral meniscus:  Small radial tear of the body. LIGAMENTS Cruciates:  Intact ACL and PCL. Collaterals: Medial collateral ligament is intact. Lateral collateral ligament complex is intact. CARTILAGE  Patellofemoral: High-grade partial and full-thickness cartilage loss over the patella. Medial:  Cartilage degeneration without focal defect. Lateral:  Cartilage degeneration without focal defect. Joint:  Trace suprapatellar joint effusion.  Normal Hoffa's fat. Popliteal Fossa:  No Baker cyst. Intact popliteus tendon. Extensor Mechanism: Complete tear of the distal quadriceps tendon with up to 1.7 cm retraction. Laxity of the patellar tendon. Bones: No acute fracture or dislocation. No suspicious bone lesion. Tiny tricompartmental marginal osteophytes. Other: Prepatellar soft tissue swelling. IMPRESSION: 1. Complete tear of the distal quadriceps tendon with up to 1.7  cm retraction. 2. Degenerated medial meniscus body with small radial tear. 3. Small radial tear of the lateral meniscus body. 4. Tricompartmental osteoarthritis, worst in the patellofemoral compartment. Electronically Signed   By: Titus Dubin M.D.   On: 08/03/2021 15:06   DG Knee Complete 4 Views Left  Result Date: 08/03/2021 CLINICAL DATA:  Knee pain status post fall. Defect above patella suspicious for tendinous rupture. EXAM: LEFT KNEE - COMPLETE 4+ VIEW COMPARISON:  None. FINDINGS: There is thickening of the quadriceps tendon containing multiple calcifications. The patella also appears low in position. No additional fracture or dislocation. IMPRESSION: Thickening of the distal patellar tendon with inferior positioning of the patella is suspicious for quadriceps tendon rupture. Electronically Signed   By: Miachel Roux M.D.   On: 08/03/2021 11:05   NM PET (PSMA) SKULL TO MID THIGH  Result Date: 08/29/2021 CLINICAL DATA:  A 75 year old male presents with history of prostate cancer diagnosed on biopsy 2 weeks ago EXAM: NUCLEAR MEDICINE PET SKULL BASE TO THIGH TECHNIQUE: 9.48 mCi F18 Piflufolastat (Pylarify) was injected intravenously. Full-ring PET imaging was performed from the skull base to thigh after the radiotracer. CT data was obtained  and used for attenuation correction and anatomic localization. COMPARISON:  Prostate MRI of Mar 07, 2021. CT of the chest, abdomen and pelvis of December 08, 2020 FINDINGS: NECK No radiotracer activity in neck lymph nodes. Incidental CT finding: None CHEST Very mild uptake in RIGHT hilar nodal tissue without adenopathy by size criteria maximum SUV of 3.1. Lymph nodes in this location not substantially changed compared to previous imaging less than pathologic by size criteria. Scattered lymph nodes elsewhere in the chest retaining fatty hila and without radiotracer accumulation. Incidental CT finding: Mitral valve replacement. The heart size stable without substantial pericardial effusion. Scattered aortic atherosclerosis. RIGHT lower lobe pulmonary nodule (image 49/8) 4 mm. Basilar atelectasis. No effusion or consolidation. Airways are patent. ABDOMEN/PELVIS Prostate: Focal activity in the LEFT prostate gland in the mid gland tracking towards the apex likely corresponding to lesion on MRI. Maximum SUV of 6.3, 1.7 cm greatest axial dimension estimated based on SUV and size of area of abnormality on PET scan Lymph nodes: No abnormal radiotracer accumulation within pelvic or abdominal nodes. Liver: No radiotracer accumulation in the liver to suggest metastatic disease. Incidental CT finding: No acute findings relative to liver. Subtle area of hypoattenuation is well-circumscribed more likely a cyst and not associated with increased radiotracer accumulation in the RIGHT hemi liver measuring less than a cm at 9 mm. Likely present on previous CT imaging though not well evaluated on previous CT angiography no pericholecystic stranding. Pancreas with normal contour. Spleen normal size and contour. Adrenal glands with RIGHT adrenal adenoma measuring 12 mm and with density of 10 Hounsfield units or less. LEFT adrenal gland is normal. No acute findings relative to the kidneys. Urinary bladder is collapsed. Small hiatal hernia.  Normal appendix. Duplicated inferior vena cava. Aortic atherosclerosis without aneurysm. Post bilateral inguinal herniorrhaphy. SKELETON No focal  activity to suggest skeletal metastasis. IMPRESSION: Findings of prostate cancer in the LEFT prostate gland. No signs to indicate metastatic disease to the neck, chest, abdomen or pelvis. Very limited activity in the RIGHT hilum in the chest without visible lymph node likely reactive, attention on follow-up. LEFT adrenal adenoma and aortic atherosclerosis. Electronically Signed   By: Zetta Bills M.D.   On: 08/29/2021 08:17      IMPRESSION/PLAN: 75 y.o. gentleman with Stage T1c adenocarcinoma of the prostate with a  Gleason score of 3+5 and a PSA of 9.48.    We discussed the patient's workup and outlined the nature of prostate cancer in this setting. The patient's T stage, Gleason's score, and PSA put him into the high risk group. Accordingly, he is eligible for a variety of potential treatment options including prostatectomy for LT-ADT in combination with either 8 weeks of external radiation or 5 weeks of external radiation with an upfront brachytherapy boost. We discussed the available radiation techniques, and focused on the details and logistics of delivery.  He is not an ideal candidate for brachytherapy due to his large volume prostate greater than 100 g as well as severe LUTS.  Therefore, we discussed and outlined the risks, benefits, short and long-term effects associated with daily external beam radiotherapy and compared and contrasted these with prostatectomy. We discussed the role of SpaceOAR gel in reducing the rectal toxicity associated with radiotherapy. We also detailed the role of ADT in the treatment of high risk prostate cancer and outlined the associated side effects that could be expected with this therapy. Given the patient's persistent severe LUTS, the consensus recommendation is to begin ADT now with plans to proceed with a channel TURP at the  time of fiducial markers and SpaceOAR gel placement in 11/2021, approximately 8 weeks from the start of ADT. He appears to have a good understanding of his disease and our treatment recommendations which are of curative intent.  He was encouraged to ask questions that were answered to his stated satisfaction.  At the end of the conversation, the patient is interested in moving forward with LT-ADT concurrent with 8 weeks of external beam therapy. We will share our discussion with [name] and make arrangements for a follow-up visit to start ADT in the next few weeks and we will also coordinate for fiducial markers and SpaceOAR gel placement at the time of TURP procedure in 11/2021. We will wait approximately 4 weeks after the TURP procedure before proceeding with IMRT in order to allow for healing from the procedure prior to starting the daily radiation. The patient appears to have a good understanding of his disease and is in agreement with the stated plan. Therefore, we will move forward with treatment planning accordingly, in anticipation of beginning IMRT January/February, approximately 4 weeks after his TURP with fiducial/SpaceOAR placement in 11/2021.  We enjoyed meeting with him today and look forward to continuing to participate in his care.  He knows that he is welcome to call at anytime with any questions or concerns related to radiation.  We personally spent 60 minutes in this encounter including chart review, reviewing radiological studies, meeting face-to-face with the patient, entering orders and completing documentation.    Nicholos Johns, PA-C    Tyler Pita, MD  [facility] Direct Dial: [phone]  Fax: [phone] [email or web address]  Skype  [email or web address]   This document serves as a record of services personally performed by Tyler Pita, MD and Freeman Caldron, PA-C. It was created on their behalf by Wilburn Mylar, a trained medical scribe. The creation  of this record is based on the scribe's personal observations and the provider's statements to them. This document has been checked and approved by the attending provider.

## 2021-08-29 NOTE — Progress Notes (Signed)
                               Care Plan Summary  Name:  DOB:    Your Medical Team:   Urologist -  Dr. Raynelle Bring, [facility]  Radiation Oncologist - Dr. Tyler Pita, [facility]   Medical Oncologist - Dr. Zola Button, [place]  Recommendations: 1) ADT (Androgen Deprivation Therapy)  2) Radiation     * These recommendations are based on information available as of today's consult.      Recommendations may change depending on the results of further tests or exams.    Next Steps: 1) [facility] will contact you to set up ADT  2) Dr. Johny Shears office will coordinate radiation and contact you   When appointments need to be scheduled, you will be contacted by [facility] and/or [facility].  Questions?  Please do not hesitate to call Thomas Puller, RN, BSN, at [phone] any questions or concerns.  Thomas [name] is your Oncology Nurse Navigator and is available to assist you while you're receiving your medical care at [facility].

## 2021-08-29 NOTE — Consult Note (Signed)
[facility]     08/29/2021   --------------------------------------------------------------------------------   Thomas [name]  MRN: [id number]  DOB: [birth year], 75 year old Male  SSN: [id number]   PRIMARY CARE:  Russ Halo. Jamie Brookes), MD  REFERRING:  Raynelle Bring, Eduardo Osier  PROVIDER:  Raynelle Bring, M.D.  LOCATION:  [facility] [phone]     --------------------------------------------------------------------------------   CC/HPI: CC: Prostate Cancer   PCP: Dr. [name]  Location of consult: [facility]   Mr. Stern is a 75 year old gentleman with a medical history significant for mitral valve prolapse s/p repair in March 2022. He also is recovering from a recent quadriceps tendon rupture repaired in early October. He was initially diagnosed with very low risk prostate cancer in May 2014 when his PSA was 5.37 and he was found to have < 5% of 1 out of 16 cores with Gleason 3+3=6 adenocarcinoma. Of note, his prostate size is > 100 cc. He proceeded with active surveillance management of his prostate cancer with his surveillance history outlined below:   Surveillance  Jan 2015: MRI - 1.6 cm nodule at R central base  Jan 2015: 26 core biopsy - 1/26 cores positive -- L mid (5%, 3+3=6), Vol 87.6 cc  Feb 2017: 12 core biopsy - benign, Vol 85.2 cc  Feb 2019: 12 core biopsy - 1/12 cores positive - L base (5%, 3+3=6), Vol 105.5 cc  May 2022: MRI - 2.5 cm PI RADS 3 lesion of left apical transition zone   His most recent biopsy was delayed due to his requirement for anticoagulation for a few months after his mitral valve repair. He also developed urinary retention after his surgery in the spring. He was started on alpha blocker therapy but did not tolerate this due to retrograde ejaculation. We also started finasteride which he also stopped due to perceived retrograde ejaculation with this  medication. He has therefore not wanted to proceed with medical therapy for BPH. Finally, he also has had chronic bacteruria since his catheterization in the spring for urinary retention and this has been difficult to clear completely. His PSA had increased to 18.5 in June 2022 but it was felt to be related to his recent catheterizations and infection. Repeat PSA on 05/26/21 had decreased to 9.48.   He eventually was able to proceed with an MR/US fusion biopsy on 08/01/21 that demonstrated Gleason 3+3=6 adenocarcinoma in 5% of 1 out of 12 cores on systematic biopsies but upgraded Gleason 3+5=8 adenocarcinoma with 3 out of 4 MR targeted biopsies.   Family history: None.   Imaging studies: PSMA PET scan (08/25/21) - negative for metastatic disease   PMH: He has a history of mitral valve repair (March 2022).  PSH: Hernia repair.   TNM stage: cT1c N0 M0  PSA: 9.48  Gleason score: 3+5=8  Biopsy (08/01/21): 4/16 cores positive  Left: L mid (5%, 3+3=6)  Right: Benign  Targeted: 3/4 cores positive (80% - 3+5=8, 70% and 60% 3+4=7)  Prostate volume: 103.8 cc   Nomogram  OC disease: 38%  EPE: 60%  SVI: 10%  LNI: 13%  PFS (5 year, 10 year): 49%, 34%   Urinary function: IPSS is 31.  Erectile function: SHIM score is 19.     ALLERGIES: No Allergies    MEDICATIONS: Aspirin 81 mg tablet,chewable 1 tablet PO Daily  Finasteride 5 mg tablet 1 tablet PO Daily  Levofloxacin 750 mg tablet Please take  one tablet the morning of your biopsy.  Levofloxacin 750 mg tablet Please take one tablet the morning of your biopsy.  Metoprolol Tartrate  Tamsulosin Hcl 0.4 mg capsule 1 capsule PO Q HS  Tamsulosin Hcl 0.4 mg capsule     GU PSH: Catheterize For Residual - 03/31/2021 Prostate Needle Biopsy - 08/01/2021, 2019 Vasectomy - 2013       [facility] Notes: Surgery Of Male Genitalia Vasectomy   NON-GU PSH: Hernia Repair Mitral valve replacement Surgical Pathology, Gross And Microscopic Examination For Prostate  Needle - 08/01/2021, 2019     GU PMH: Prostate Cancer - 08/01/2021, - 04/26/2021, - 09/21/2020, Prostate cancer, - 2016 BPH w/LUTS - 04/26/2021 Incomplete bladder emptying - 04/26/2021, - 03/31/2021 Acute Cystitis/UTI - 03/31/2021 Dysuria - 03/31/2021 Straining on Urination - 03/31/2021 Urinary Frequency - 03/31/2021 Urinary Urgency - 03/31/2021 Urinary Retention - 03/15/2021, - 03/14/2021, - 02/22/2021, - 02/14/2021, - 02/01/2021      PMH Notes:   1) Prostate cancer: He was initially evaluated in September 2013 for an elevated PSA of 5.37 prompting a prostate biopsy at that time which indicated HGPIN atypia at the left lateral base. For this reason, he underwent a follow up repeat biopsy on 03/27/13 which demonstrated Gleason 3+3=6 adenocarcinoma in < 5% of 1 out of 16 biopsy cores. This was reviewed by Dr. Tommie Ard at [facility]. After discussing options for treatment/management, he elected to proceed with active surveillance.   Initial diagnosis: May 2014  TNM stage: cT1c Nx Mx  PSA: 5.37  Gleason score: 3+3=6  Biopsy (03/27/13): 1/16 cores -- L mid (<5%), Vol 76.3 cc  PSAD: 0.06   Surveillance  Jan 2015: MRI - 1.6 cm nodule at R central base  Jan 2015: 26 core biopsy - 1/26 cores positive -- L mid (5%, 3+3=6), Vol 87.6 cc  Feb 2017: 12 core biopsy - benign, Vol 85.2 cc  Feb 2019: 12 core biopsy - 1/12 cores positive - L base (5%, 3+3=6), Vol 105.5 cc  May 2022: MRI - 2.5 cm PI RADS 3 lesion of left apical transition zone  Sep 2022: MR/US fusion biopsy -   Baseline urinary function: He has minimal baseline voiding symptoms. IPSS is 5.  Baseline erectile function: He denies erectile dysfunction. SHIM score is 22.   2) BPH/LUTS: He has a known enlarged prostate (> 100 cc). He developed postoperative urinary retention after his mitral valve repair in 2022.   Current treatment: Tamsulosin 0.4 mg     NON-GU PMH: Bacteriuria (Stable) - 08/01/2021, - 04/26/2021 Nonrheumatic mitral (valve) prolapse     FAMILY HISTORY: Coronary Artery Disease - Runs In Family Death In The Family Father - Runs In Family Death In The Family Mother - Runs In Family No pertinent family history - Other   SOCIAL HISTORY: Marital Status: Single Preferred Language: English; Ethnicity: Not Hispanic Or Latino; Race: White     Notes: Never A Smoker, Alcohol Use, Occupation:, Tobacco Use   REVIEW OF SYSTEMS:    GU Review Male:   Patient denies frequent urination, hard to postpone urination, burning/ pain with urination, get up at night to urinate, leakage of urine, stream starts and stops, trouble starting your streams, and have to strain to urinate .  Gastrointestinal (Upper):   Patient denies nausea and vomiting.  Gastrointestinal (Lower):   Patient denies diarrhea and constipation.  Constitutional:   Patient denies fever, night sweats, fatigue, and weight loss.  Skin:   Patient denies skin rash/ lesion and itching.  Eyes:  Patient denies blurred vision and double vision.  Ears/ Nose/ Throat:   Patient denies sore throat and sinus problems.  Hematologic/Lymphatic:   Patient denies swollen glands and easy bruising.  Cardiovascular:   Patient denies leg swelling and chest pains.  Respiratory:   Patient denies cough and shortness of breath.  Endocrine:   Patient denies excessive thirst.  Musculoskeletal:   Patient denies back pain and joint pain.  Neurological:   Patient denies headaches and dizziness.  Psychologic:   Patient denies depression and anxiety.   VITAL SIGNS: None   MULTI-SYSTEM PHYSICAL EXAMINATION:    Constitutional: Well-nourished. No physical deformities. Normally developed. Good grooming.  Respiratory: No labored breathing, no use of accessory muscles.   Cardiovascular: Normal temperature, normal extremity pulses, no swelling, no varicosities.     Complexity of Data:  Lab Test Review:   PSA  Records Review:   Pathology Reports, Previous Patient Records  X-Ray Review: PET Scan: Reviewed  Films.     05/26/21 04/18/21 09/13/20 03/02/20 09/02/19 01/30/19 06/06/18 09/19/17  PSA  Total PSA 9.48 ng/mL 18.50 ng/mL 9.40 ng/mL 8.01 ng/mL 9.79 ng/mL 8.44 ng/mL 7.96 ng/mL 9.05 ng/mL   Notes:                     CLINICAL DATA: A 75 year old male presents with history of prostate  cancer diagnosed on biopsy 2 weeks ago   EXAM:  NUCLEAR MEDICINE PET SKULL BASE TO THIGH   TECHNIQUE:  9.48 mCi F18 Piflufolastat (Pylarify) was injected intravenously.  Full-ring PET imaging was performed from the skull base to thigh  after the radiotracer. CT data was obtained and used for attenuation  correction and anatomic localization.   COMPARISON: Prostate MRI of Mar 07, 2021. CT of the chest, abdomen  and pelvis of December 08, 2020   FINDINGS:  NECK   No radiotracer activity in neck lymph nodes.   Incidental CT finding: None   CHEST   Very mild uptake in RIGHT hilar nodal tissue without adenopathy by  size criteria maximum SUV of 3.1. Lymph nodes in this location not  substantially changed compared to previous imaging less than  pathologic by size criteria. Scattered lymph nodes elsewhere in the  chest retaining fatty hila and without radiotracer accumulation.   Incidental CT finding: Mitral valve replacement. The heart size  stable without substantial pericardial effusion. Scattered aortic  atherosclerosis.   RIGHT lower lobe pulmonary nodule (image 49/8) 4 mm. Basilar  atelectasis. No effusion or consolidation. Airways are patent.   ABDOMEN/PELVIS   Prostate: Focal activity in the LEFT prostate gland in the mid gland  tracking towards the apex likely corresponding to lesion on MRI.  Maximum SUV of 6.3, 1.7 cm greatest axial dimension estimated based  on SUV and size of area of abnormality on PET scan   Lymph nodes: No abnormal radiotracer accumulation within pelvic or  abdominal nodes.   Liver: No radiotracer accumulation in the liver to suggest  metastatic disease.    Incidental CT finding:   No acute findings relative to liver. Subtle area of hypoattenuation  is well-circumscribed more likely a cyst and not associated with  increased radiotracer accumulation in the RIGHT hemi liver measuring  less than a cm at 9 mm. Likely present on previous CT imaging though  not well evaluated on previous CT angiography no pericholecystic  stranding. Pancreas with normal contour. Spleen normal size and  contour. Adrenal glands with RIGHT adrenal adenoma measuring 12 mm  and  with density of 10 Hounsfield units or less. LEFT adrenal gland  is normal. No acute findings relative to the kidneys. Urinary  bladder is collapsed.   Small hiatal hernia. Normal appendix. Duplicated inferior vena cava.  Aortic atherosclerosis without aneurysm. Post bilateral inguinal  herniorrhaphy.   SKELETON   No focal activity to suggest skeletal metastasis.   IMPRESSION:  Findings of prostate cancer in the LEFT prostate gland. No signs to  indicate metastatic disease to the neck, chest, abdomen or pelvis.   Very limited activity in the RIGHT hilum in the chest without  visible lymph node likely reactive, attention on follow-up.   LEFT adrenal adenoma and aortic atherosclerosis.    Electronically Signed  By: Zetta Bills M.D.  On: 08/29/2021 08:17   PROCEDURES: None   ASSESSMENT:      ICD-10 Details  1 GU:   Prostate Cancer - C61   2   BPH w/LUTS - N40.1   3   Straining on Urination - R39.16    PLAN:           Document Letter(s):  Created for Patient: Clinical Summary         Notes:   1. High risk prostate cancer: I had a detailed discussion with Mr. Enriques today regarding his prostate cancer and options for treatment. We reviewed his PSMA PET scan which does not demonstrate any concern for metastatic disease.   The patient was counseled about the natural history of prostate cancer and the standard treatment options that are available for prostate cancer. It was  explained to him how his age and life expectancy, clinical stage, Gleason score/prognostic grade group, and PSA (and PSA density) affect his prognosis, the decision to proceed with additional staging studies, as well as how that information influences recommended treatment strategies. We discussed the roles for active surveillance, radiation therapy, surgical therapy, androgen deprivation, as well as ablative therapy and other invesitgational options for the treatment of prostate cancer as appropriate to his individual cancer situation. We discussed the risks and benefits of these options with regard to their impact on cancer control and also in terms of potential adverse events, complications, and impact on quality of life particularly related to urinary and sexual function. The patient was encouraged to ask questions throughout the discussion today and all questions were answered to his stated satisfaction. In addition, the patient was provided with and/or directed to appropriate resources and literature for further education about prostate cancer and treatment options.   After reviewing options, he is most interested in proceeding with definitive treatment with radiation therapy and long-term androgen deprivation. However, we did discuss his significant lower urinary tract symptoms related to his significantly enlarged prostate. After further discussion, we have agreed to proceed with the following plan.   He is going to begin androgen deprivation therapy which will allow him more time to recover from his recent quadriceps tendon repair. We will then consider proceeding with cystoscopy and transurethral resection of the prostate along with SpaceOAR insertion and fiducial marker placement at the same setting both for definitive treatment of his lower urinary tract symptoms and to prepare him for radiation therapy. We will then allow 6 to 8 weeks for him to heal from his TURP prior to proceeding with definitive  radiation therapy. He will continue androgen deprivation therapy for a minimum of 18 months. We have reviewed the potential risks, complications, and expected recovery process from the above procedures and treatments. He gives informed consent to proceed.  He will see me preoperatively for a urinalysis and culture considering his chronic bacteriuria that has been previously present.   2. BPH/LUTS: We will address these prior to radiation therapy with a transurethral resection of the prostate and allow adequate healing time. He will need approximately 6 to 8 weeks to recover before proceeding with radiation therapy.   CC: Dr. [name]  Dr. Tyler Pita  Dr. Zola Button

## 2021-08-29 NOTE — Progress Notes (Signed)
Reason for the request:    Prostate cancer  HPI: I was asked by [name] to evaluate Thomas [name] for the evaluation of prostate cancer.  He is a 75 year old man with prostate cancer diagnosed in 2014 at that time he presented with low risk disease and has been on active surveillance since that time.  He had a repeat biopsy in 2017 and 2019 which showed a Gleason score 6 and remained on active surveillance.  MRI in 2022 showed a 2.5 cm PI-RADS 3 lesion in the left transitional zone.  Based on these findings he underwent a biopsy on August 02, 2019 which showed a targeted lesion prostate cancer 3+4 = 7 in 1 core and 3+5 = 8 and another.  In the meantime, he did have a mitral valve repair and also underwent patellar tendon surgery after sustained a fall.  He is recovering from both of those events at this time.  He is utilizing a walker but still has lots of issues with nocturia and frequency which has disrupted his sleep and quality of life.  He denies any chest pain or shortness of breath.  He denies any hematuria or dysuria.  He does not report any headaches, blurry vision, syncope or seizures. Does not report any fevers, chills or sweats.  Does not report any cough, wheezing or hemoptysis.  Does not report any chest pain, palpitation, orthopnea or leg edema.  Does not report any nausea, vomiting or abdominal pain.  Does not report any constipation or diarrhea.  Does not report any skeletal complaints.    Does not report frequency, urgency or hematuria.  Does not report any skin rashes or lesions. Does not report any heat or cold intolerance.  Does not report any lymphadenopathy or petechiae.  Does not report any anxiety or depression.  Remaining review of systems is negative.     Past Medical History:  Diagnosis Date   Arthritis    Bilateral inguinal hernia    BPH (benign prostatic hyperplasia)    Cancer (HCC)    prostate cancer; per patient being followed by Dr [name] at [facility] urology ;  currently no chemotherapy    Heart murmur    Mitral regurgitation    Mitral valve prolapse    now seeing [name]    Pneumonia    At age 39   Prostate cancer Thomas [name])    S/P minimally-invasive mitral valve repair 01/24/2021   Complex valvuloplasty including quadrangular resection of posterior leaflet with 32 mm Sorin Memo 4D ring annuloplasty via right mini thoracotomy result  :   Past Surgical History:  Procedure Laterality Date   BUBBLE STUDY  11/23/2020   Procedure: BUBBLE STUDY;  Surgeon: [name], MD;  Location: [place];  Service: Cardiovascular;;   CARDIAC CATHETERIZATION     FEMORAL HERNIA REPAIR Right 07/22/2017   Procedure: HERNIA REPAIR FEMORAL;  Surgeon: Jackolyn Confer, MD;  [facility];  Service: General;  Laterality: Right;   HERNIA REPAIR     INGUINAL HERNIA REPAIR N/A 07/22/2017   Procedure: LAPAROSCOPIC BILATERAL INGUINAL HERNIA REPAIR WITH MESH;  Surgeon: Jackolyn Confer, MD;  [facility];  Service: General;  Laterality: N/A;   INSERTION OF MESH Bilateral 07/22/2017   Procedure: INSERTION OF MESH;  Surgeon: Jackolyn Confer, MD;  [facility];  Service: General;  Laterality: Bilateral;   MITRAL VALVE REPAIR Right 01/24/2021   Procedure: MINIMALLY INVASIVE MITRAL VALVE REPAIR (MVR) USING 4D MEMO RING SIZE 32MM;  Surgeon: Rexene Alberts,  MD;  [facility];  Service: Open Heart Surgery;  Laterality: Right;   PROSTATE BIOPSY     QUADRICEPS TENDON REPAIR Left 08/09/2021   Procedure: OPEN LEFT KNEE QUAD REPAIR;  Surgeon: Nicholes Stairs, MD;  [facility];  Service: Orthopedics;  Laterality: Left;   RIGHT/LEFT HEART CATH AND CORONARY ANGIOGRAPHY N/A 12/23/2020   Procedure: RIGHT/LEFT HEART CATH AND CORONARY ANGIOGRAPHY;  Surgeon: Sherren Mocha, MD;  Location: [place] [facility];  Service: Cardiovascular;  Laterality: N/A;   TEE WITHOUT CARDIOVERSION N/A 11/23/2020   Procedure: TRANSESOPHAGEAL ECHOCARDIOGRAM (TEE);  Surgeon:  [name], MD;  Location: [place];  Service: Cardiovascular;  Laterality: N/A;   TEE WITHOUT CARDIOVERSION N/A 01/24/2021   Procedure: TRANSESOPHAGEAL ECHOCARDIOGRAM (TEE);  Surgeon: Rexene Alberts, MD;  Location: [place];  Service: Open Heart Surgery;  Laterality: N/A;   TONSILLECTOMY AND ADENOIDECTOMY     VASECTOMY    :   Current Outpatient Medications:    acetaminophen (TYLENOL) 500 MG tablet, Take 1,000 mg by mouth every 6 (six) hours as needed (pain.)., Disp: , Rfl:    Ascorbic Acid (VITAMIN C) 1000 MG tablet, Take 1,000 mg by mouth in the morning., Disp: , Rfl:    aspirin EC 81 MG tablet, Take 81 mg by mouth in the morning. Swallow whole., Disp: , Rfl:    Calcium Carb-Cholecalciferol (CALCIUM 600/VITAMIN D3) 600-800 MG-UNIT TABS, Take 1 tablet by mouth in the morning and at bedtime., Disp: , Rfl:    cefdinir (OMNICEF) 300 MG capsule, Take 300 mg by mouth 2 (two) times daily., Disp: , Rfl:    HYDROcodone-acetaminophen (NORCO/VICODIN) 5-325 MG tablet, Take 1-2 tablets by mouth every 4 (four) hours as needed for moderate pain or severe pain., Disp: 25 tablet, Rfl: 0   Magnesium Oxide 250 MG TABS, Take 250 mg by mouth at bedtime., Disp: , Rfl:    metoprolol tartrate (LOPRESSOR) 25 MG tablet, Take 0.5 tablets (12.5 mg total) by mouth 2 (two) times daily., Disp: 90 tablet, Rfl: 3   Multiple Vitamins-Minerals (MULTIVITAMIN WITH MINERALS) tablet, Take 1 tablet by mouth in the morning., Disp: , Rfl:    ondansetron (ZOFRAN) 4 MG tablet, Take 1 tablet (4 mg total) by mouth daily as needed for nausea or vomiting., Disp: 30 tablet, Rfl: 1:  No Known Allergies:  No family history on file.:   Social History   Socioeconomic History   Marital status: Divorced    Spouse name: Not on file   Number of children: Not on file   Years of education: Not on file   Highest education level: Not on file  Occupational History   Not on file  Tobacco Use   Smoking status: Never    Smokeless tobacco: Never  Vaping Use   Vaping Use: Never used  Substance and Sexual Activity   Alcohol use: Not Currently    Comment: seldom    Drug use: No   Sexual activity: Not on file  Other Topics Concern   Not on file  Social History Narrative   Not on file   Social Determinants of Health   Financial Resource Strain: Not on file  Food Insecurity: Not on file  Transportation Needs: Not on file  Physical Activity: Not on file  Stress: Not on file  Social Connections: Not on file  Intimate Partner Violence: Not on file  :  Pertinent items are noted in HPI.  Exam: ECOG 1  General appearance: alert and cooperative appeared without  distress. Head: atraumatic without any abnormalities. Eyes: conjunctivae/corneas clear. PERRL.  Sclera anicteric. Throat: lips, mucosa, and tongue normal; without oral thrush or ulcers. Resp: clear to auscultation bilaterally without rhonchi, wheezes or dullness to percussion. Cardio: regular rate and rhythm, S1, S2 normal, no murmur, click, rub or gallop GI: soft, non-tender; bowel sounds normal; no masses,  no organomegaly Skin: Skin color, texture, turgor normal. No rashes or lesions Lymph nodes: Cervical, supraclavicular, and axillary nodes normal. Neurologic: Grossly normal without any motor, sensory or deep tendon reflexes. Musculoskeletal: No joint deformity or effusion.    MR KNEE LEFT WO CONTRAST  Result Date: 08/03/2021 CLINICAL DATA:  Left knee pain after fall this morning. Concern for quadriceps tendon injury. EXAM: MRI OF THE LEFT KNEE WITHOUT CONTRAST TECHNIQUE: Multiplanar, multisequence MR imaging of the knee was performed. No intravenous contrast was administered. COMPARISON:  Left knee x-rays from same day. FINDINGS: MENISCI Medial meniscus:  Degenerated body with small radial tear. Lateral meniscus:  Small radial tear of the body. LIGAMENTS Cruciates:  Intact ACL and PCL. Collaterals: Medial collateral ligament is intact.  Lateral collateral ligament complex is intact. CARTILAGE Patellofemoral: High-grade partial and full-thickness cartilage loss over the patella. Medial:  Cartilage degeneration without focal defect. Lateral:  Cartilage degeneration without focal defect. Joint:  Trace suprapatellar joint effusion.  Normal Hoffa's fat. Popliteal Fossa:  No Baker cyst. Intact popliteus tendon. Extensor Mechanism: Complete tear of the distal quadriceps tendon with up to 1.7 cm retraction. Laxity of the patellar tendon. Bones: No acute fracture or dislocation. No suspicious bone lesion. Tiny tricompartmental marginal osteophytes. Other: Prepatellar soft tissue swelling. IMPRESSION: 1. Complete tear of the distal quadriceps tendon with up to 1.7 cm retraction. 2. Degenerated medial meniscus body with small radial tear. 3. Small radial tear of the lateral meniscus body. 4. Tricompartmental osteoarthritis, worst in the patellofemoral compartment. Electronically Signed   By: Thomas [name] M.D.   On: 08/03/2021 15:06   DG Knee Complete 4 Views Left  Result Date: 08/03/2021 CLINICAL DATA:  Knee pain status post fall. Defect above patella suspicious for tendinous rupture. EXAM: LEFT KNEE - COMPLETE 4+ VIEW COMPARISON:  None. FINDINGS: There is thickening of the quadriceps tendon containing multiple calcifications. The patella also appears low in position. No additional fracture or dislocation. IMPRESSION: Thickening of the distal patellar tendon with inferior positioning of the patella is suspicious for quadriceps tendon rupture. Electronically Signed   By: Thomas [name] M.D.   On: 08/03/2021 11:05   NM PET (PSMA) SKULL TO MID THIGH  Result Date: 08/29/2021 CLINICAL DATA:  A 75 year old male presents with history of prostate cancer diagnosed on biopsy 2 weeks ago EXAM: NUCLEAR MEDICINE PET SKULL BASE TO THIGH TECHNIQUE: 9.48 mCi F18 Piflufolastat (Pylarify) was injected intravenously. Full-ring PET imaging was performed from the skull  base to thigh after the radiotracer. CT data was obtained and used for attenuation correction and anatomic localization. COMPARISON:  Prostate MRI of Mar 07, 2021. CT of the chest, abdomen and pelvis of December 08, 2020 FINDINGS: NECK No radiotracer activity in neck lymph nodes. Incidental CT finding: None CHEST Very mild uptake in RIGHT hilar nodal tissue without adenopathy by size criteria maximum SUV of 3.1. Lymph nodes in this location not substantially changed compared to previous imaging less than pathologic by size criteria. Scattered lymph nodes elsewhere in the chest retaining fatty hila and without radiotracer accumulation. Incidental CT finding: Mitral valve replacement. The heart size stable without substantial pericardial effusion. Scattered aortic atherosclerosis. RIGHT  lower lobe pulmonary nodule (image 49/8) 4 mm. Basilar atelectasis. No effusion or consolidation. Airways are patent. ABDOMEN/PELVIS Prostate: Focal activity in the LEFT prostate gland in the mid gland tracking towards the apex likely corresponding to lesion on MRI. Maximum SUV of 6.3, 1.7 cm greatest axial dimension estimated based on SUV and size of area of abnormality on PET scan Lymph nodes: No abnormal radiotracer accumulation within pelvic or abdominal nodes. Liver: No radiotracer accumulation in the liver to suggest metastatic disease. Incidental CT finding: No acute findings relative to liver. Subtle area of hypoattenuation is well-circumscribed more likely a cyst and not associated with increased radiotracer accumulation in the RIGHT hemi liver measuring less than a cm at 9 mm. Likely present on previous CT imaging though not well evaluated on previous CT angiography no pericholecystic stranding. Pancreas with normal contour. Spleen normal size and contour. Adrenal glands with RIGHT adrenal adenoma measuring 12 mm and with density of 10 Hounsfield units or less. LEFT adrenal gland is normal. No acute findings relative to the  kidneys. Urinary bladder is collapsed. Small hiatal hernia. Normal appendix. Duplicated inferior vena cava. Aortic atherosclerosis without aneurysm. Post bilateral inguinal herniorrhaphy. SKELETON No focal  activity to suggest skeletal metastasis. IMPRESSION: Findings of prostate cancer in the LEFT prostate gland. No signs to indicate metastatic disease to the neck, chest, abdomen or pelvis. Very limited activity in the RIGHT hilum in the chest without visible lymph node likely reactive, attention on follow-up. LEFT adrenal adenoma and aortic atherosclerosis. Electronically Signed   By: Thomas [name] M.D.   On: 08/29/2021 08:17    Assessment and Plan:   75 year old with prostate cancer initially diagnosed in 2014 with a Gleason score of 6 and subsequently developed high risk prostate cancer in September 2020.  With Gleason score 3+4 equal 7 as well as 3+5 = 8 with 80% of 1 core affected by targeted biopsy.  His PSA currently at 9.48.  His case was discussed today in the prostate cancer multidisciplinary clinic and treatment options were reviewed.  His pathology results as well as MRI were reviewed by pathology and radiology respectively.  His treatment options including primary surgical therapy given his lower urinary tract symptoms versus radiation therapy were reviewed.  Given his high risk disease he would require long-term anticoagulation in conjunction with radiation.  After discussion today, he is open to the idea of upfront androgen deprivation therapy followed by a TURP procedure and subsequently definitive radiation therapy with androgen deprivation.  The role for any additional systemic therapy were discussed including androgen receptor synthesis inhibitors as well as systemic chemotherapy were discussed and these options are deferred at this time.  All his questions were answered to his satisfaction.   45  minutes were dedicated to this visit. The time was spent on reviewing laboratory data,  imaging studies, discussing treatment options, and answering questions regarding future plan.   A copy of this consult has been forwarded to the requesting physician.

## 2021-08-29 NOTE — Progress Notes (Signed)
[place] Psychosocial Distress Screening Spiritual Care  Met with Thomas [name] in [facility] to introduce [facility] team/resources, reviewing distress screen per protocol.  The patient scored a 4 on the Psychosocial Distress Thermometer which indicates moderate distress. Also assessed for distress and other psychosocial needs.   ONCBCN DISTRESS SCREENING 08/29/2021  Screening Type Initial Screening  Distress experienced in past week (1-10) 4  Emotional problem type Adjusting to illness  Physical Problem type Getting around;Bathing/dressing;Changes in urination  Referral to support programs Yes   Thomas [name] lives alone, has a local son and grandchildren, and reports good support from his dogs (Thomas [name] and Thomas [name], aka "[name]"). He almost a month into recovery from a significant knee injury due to a fall.   Provided empathic listening, emotional support, and affirmation of strengths (including perspective and transferable coping skills). Encouraged Prostate Cancer Support Group and [facility] support programming.   Follow up needed: No. Per Thomas [name], no other needs or concerns at this time, but he has my direct-dial number knows to reach out as needed/desired.   [place], [place], [facility] Pager [phone] Voicemail [phone]

## 2021-08-30 NOTE — Progress Notes (Addendum)
REFERRING PROVIDER: Raynelle Bring, MD [place],  [place] [address]  PRIMARY PROVIDER:  [name], MD  PRIMARY REASON FOR VISIT:  1. Malignant neoplasm of prostate ([place])    HISTORY OF PRESENT ILLNESS:   Thomas [name], a 75 y.o. male, was seen for a [place] cancer genetics consultation at the request of [name] due to a personal history of cancer.  Thomas [name] presents to clinic today to discuss the possibility of a hereditary predisposition to cancer, to discuss genetic testing, and to further clarify his future cancer risks, as well as potential cancer risks for family members.   Thomas [name] was initially diagnosed with prostate cancer in 2014 (age 51) with a Gleason score of 6 and subsequently developed high risk prostate cancer in September 2020 (age 4).    CANCER HISTORY:  Oncology History  Malignant neoplasm of prostate ([place])  08/01/2021 Cancer Staging   Staging form: Prostate, AJCC 8th Edition - Clinical stage from 08/01/2021: Stage IIC (cT1c, cN0, cM0, PSA: 9.5, Grade Group: 4) - Signed by Freeman Caldron, PA-C on 08/29/2021 Histopathologic type: Adenocarcinoma, NOS Stage prefix: Initial diagnosis Prostate specific antigen (PSA) range: Less than 10 Gleason primary pattern: 3 Gleason secondary pattern: 5 Gleason score: 8 Histologic grading system: 5 grade system Number of biopsy cores examined: 16 Number of biopsy cores positive: 4 Location of positive needle core biopsies: Both sides    08/29/2021 Initial Diagnosis   Malignant neoplasm of prostate [facility])     Past Medical History:  Diagnosis Date   Arthritis    Bilateral inguinal hernia    BPH (benign prostatic hyperplasia)    Cancer (HCC)    prostate cancer; per patient being followed by Dr [name] at [facility] urology ; currently no chemotherapy    Heart murmur    Mitral regurgitation    Mitral valve prolapse    now seeing [name]    Pneumonia    At age 52   Prostate cancer [facility])    S/P  minimally-invasive mitral valve repair 01/24/2021   Complex valvuloplasty including quadrangular resection of posterior leaflet with 32 mm Sorin Memo 4D ring annuloplasty via right mini thoracotomy result    Past Surgical History:  Procedure Laterality Date   BUBBLE STUDY  11/23/2020   Procedure: BUBBLE STUDY;  Surgeon: [name], MD;  Location: [place];  Service: Cardiovascular;;   CARDIAC CATHETERIZATION     FEMORAL HERNIA REPAIR Right 07/22/2017   Procedure: HERNIA REPAIR FEMORAL;  Surgeon: Jackolyn Confer, MD;  [facility];  Service: General;  Laterality: Right;   HERNIA REPAIR     INGUINAL HERNIA REPAIR N/A 07/22/2017   Procedure: LAPAROSCOPIC BILATERAL INGUINAL HERNIA REPAIR WITH MESH;  Surgeon: Jackolyn Confer, MD;  [facility];  Service: General;  Laterality: N/A;   INSERTION OF MESH Bilateral 07/22/2017   Procedure: INSERTION OF MESH;  Surgeon: Jackolyn Confer, MD;  [facility];  Service: General;  Laterality: Bilateral;   MITRAL VALVE REPAIR Right 01/24/2021   Procedure: MINIMALLY INVASIVE MITRAL VALVE REPAIR (MVR) USING 4D MEMO RING SIZE 32MM;  Surgeon: Rexene Alberts, MD;  Location: [place];  Service: Open Heart Surgery;  Laterality: Right;   PROSTATE BIOPSY     QUADRICEPS TENDON REPAIR Left 08/09/2021   Procedure: OPEN LEFT KNEE QUAD REPAIR;  Surgeon: Nicholes Stairs, MD;  [facility];  Service: Orthopedics;  Laterality: Left;   RIGHT/LEFT HEART CATH AND CORONARY ANGIOGRAPHY N/A 12/23/2020   Procedure: RIGHT/LEFT  HEART CATH AND CORONARY ANGIOGRAPHY;  Surgeon: Sherren Mocha, MD;  Location: [place] [facility];  Service: Cardiovascular;  Laterality: N/A;   TEE WITHOUT CARDIOVERSION N/A 11/23/2020   Procedure: TRANSESOPHAGEAL ECHOCARDIOGRAM (TEE);  Surgeon: [name], MD;  Location: [place];  Service: Cardiovascular;  Laterality: N/A;   TEE WITHOUT CARDIOVERSION N/A 01/24/2021   Procedure: TRANSESOPHAGEAL  ECHOCARDIOGRAM (TEE);  Surgeon: Rexene Alberts, MD;  Location: [place];  Service: Open Heart Surgery;  Laterality: N/A;   TONSILLECTOMY AND ADENOIDECTOMY     VASECTOMY      Social History   Socioeconomic History   Marital status: Divorced    Spouse name: Not on file   Number of children: Not on file   Years of education: Not on file   Highest education level: Not on file  Occupational History   Not on file  Tobacco Use   Smoking status: Never   Smokeless tobacco: Never  Vaping Use   Vaping Use: Never used  Substance and Sexual Activity   Alcohol use: Not Currently    Comment: seldom    Drug use: No   Sexual activity: Not on file  Other Topics Concern   Not on file  Social History Narrative   Not on file   Social Determinants of Health   Financial Resource Strain: Not on file  Food Insecurity: Not on file  Transportation Needs: Not on file  Physical Activity: Not on file  Stress: Not on file  Social Connections: Not on file     FAMILY HISTORY:  We obtained a detailed, 4-generation family history.  There is no known family history of cancer.     Thomas [name] is unaware of previous family history of genetic testing for hereditary cancer risks. There no known consanguinity.  GENETIC COUNSELING ASSESSMENT: Thomas [name] is a 75 y.o. male with a personal history of high risk prostate cancer. We discussed and recommended the following at today's visit.   DISCUSSION: We discussed that 5 - 10% of cancer is hereditary, with most cases of prostate cancer associated with genes such as BRCA1/2.  There are other genes that can be associated with hereditary prostate cancer syndromes.  We discussed that testing is beneficial for several reasons including knowing how to follow individuals after completing their treatment, identifying whether potential treatment options would be beneficial, and understanding if other family members could be at risk for cancer and allowing them to undergo  genetic testing.   We reviewed the characteristics, features and inheritance patterns of hereditary cancer syndromes. We also discussed genetic testing, including the appropriate family members to test, the process of testing, insurance coverage and turn-around-time for results. We discussed the implications of a negative, positive, carrier and/or variant of uncertain significant result.   The CustomNext-Cancer+RNAinsight panel offered by [facility] includes sequencing and rearrangement analysis for the following 47 genes:  APC, ATM, AXIN2, BARD1, BMPR1A, BRCA1, BRCA2, BRIP1, CDH1, CDK4, CDKN2A, CHEK2, CTNNA1, DICER1, EPCAM, GREM1, HOXB13, KIT, MEN1, MLH1, MSH2, MSH3, MSH6, MUTYH, NBN, NF1, NTHL1, PALB2, PDGFRA, PMS2, POLD1, POLE, PTEN, RAD50, RAD51C, RAD51D, SDHA, SDHB, SDHC, SDHD, SMAD4, SMARCA4, STK11, TP53, TSC1, TSC2, and VHL.  RNA data is routinely analyzed for use in variant interpretation for all genes.  Based on Thomas [name] personal history of high risk prostate cancer, he meets medical criteria for genetic testing. Despite that he meets criteria, he may still have an out of pocket cost. We discussed that if his out of pocket cost for testing  is over $100, the laboratory will call and confirm whether he wants to proceed with testing.  If the out of pocket cost of testing is less than $100 he will be billed by the genetic testing laboratory.   PLAN: After considering the risks, benefits, and limitations, Thomas [name] provided informed consent to pursue genetic testing and a saliva kit will be mailed to his home for [facility] CustomNext Panel (47 genes). Results should be available within approximately 2-3 weeks' time, at which point they will be disclosed by telephone to Thomas [name], as will any additional recommendations warranted by these results. Thomas [name] will receive a summary of his genetic counseling visit and a copy of his results once available. This information will also be available  in Epic.   Thomas [name] questions were answered to his satisfaction today. Our contact information was provided should additional questions or concerns arise. Thank you for the referral and allowing [place] to share in the care of your patient.   Lucille Passy, MS, [facility] Genetic Counselor [email or web address] (P) [phone]  The patient was seen for a total of 20 minutes in face-to-face genetic counseling.  The patient was seen alone.  Drs. Magrinat, Lindi Adie and/or Burr Medico were available to discuss this case as needed.   _______________________________________________________________________ For Office Staff:  Number of people involved in session: 1 Was an Intern/ student involved with case: no

## 2021-09-01 DIAGNOSIS — M25562 Pain in left knee: Secondary | ICD-10-CM | POA: Diagnosis not present

## 2021-09-02 DIAGNOSIS — Z8546 Personal history of malignant neoplasm of prostate: Secondary | ICD-10-CM | POA: Diagnosis not present

## 2021-09-04 ENCOUNTER — Telehealth: Payer: Self-pay | Admitting: *Deleted

## 2021-09-04 NOTE — Telephone Encounter (Signed)
Called patient to update, spoke with patient. 

## 2021-09-06 DIAGNOSIS — M25562 Pain in left knee: Secondary | ICD-10-CM | POA: Diagnosis not present

## 2021-09-13 ENCOUNTER — Encounter: Payer: Self-pay | Admitting: Urology

## 2021-09-13 DIAGNOSIS — M25562 Pain in left knee: Secondary | ICD-10-CM | POA: Diagnosis not present

## 2021-09-13 NOTE — Progress Notes (Signed)
Patient is scheduled for ADT 09/18/21.  Nicholos Johns, MMS, PA-C [facility] [place]: [phone]  Fax: [phone]

## 2021-09-15 ENCOUNTER — Telehealth: Payer: Self-pay | Admitting: *Deleted

## 2021-09-15 NOTE — Telephone Encounter (Signed)
CALLED PATIENT TO INFORM OF ADT APPT. FOR 09-18-21 @ 1:30 PM @ [facility], LVM FOR A RETURN CALL

## 2021-09-18 DIAGNOSIS — Z5111 Encounter for antineoplastic chemotherapy: Secondary | ICD-10-CM | POA: Diagnosis not present

## 2021-09-18 DIAGNOSIS — C61 Malignant neoplasm of prostate: Secondary | ICD-10-CM | POA: Diagnosis not present

## 2021-09-20 DIAGNOSIS — M25562 Pain in left knee: Secondary | ICD-10-CM | POA: Diagnosis not present

## 2021-09-25 ENCOUNTER — Encounter: Payer: Self-pay | Admitting: Genetic Counselor

## 2021-09-25 ENCOUNTER — Telehealth: Payer: Self-pay | Admitting: Genetic Counselor

## 2021-09-25 DIAGNOSIS — Z1379 Encounter for other screening for genetic and chromosomal anomalies: Secondary | ICD-10-CM | POA: Insufficient documentation

## 2021-09-25 NOTE — Telephone Encounter (Signed)
I contacted Mr. Shell to discuss his genetic testing results. No pathogenic variants were identified in the 47 genes analyzed.   The test report has been scanned into EPIC and is located under the Molecular Pathology section of the Results Review tab.  A portion of the result report is included below for reference. Detailed clinic note to follow.  Lucille Passy, MS, [facility] Genetic Counselor [email or web address] (P) [phone]

## 2021-10-04 DIAGNOSIS — M25562 Pain in left knee: Secondary | ICD-10-CM | POA: Diagnosis not present

## 2021-10-06 DIAGNOSIS — M25562 Pain in left knee: Secondary | ICD-10-CM | POA: Diagnosis not present

## 2021-10-09 ENCOUNTER — Ambulatory Visit: Payer: Self-pay | Admitting: Genetic Counselor

## 2021-10-09 DIAGNOSIS — M25562 Pain in left knee: Secondary | ICD-10-CM | POA: Diagnosis not present

## 2021-10-09 DIAGNOSIS — C61 Malignant neoplasm of prostate: Secondary | ICD-10-CM

## 2021-10-09 NOTE — Progress Notes (Signed)
HPI:   [name] was previously seen in the [place] clinic due to a personal history of cancer and concerns regarding a hereditary predisposition to cancer. Please refer to our prior cancer genetics clinic note for more information regarding our discussion, assessment and recommendations, at the time. [name] recent genetic test results were disclosed to him, as were recommendations warranted by these results. These results and recommendations are discussed in more detail below.  CANCER HISTORY:  Oncology History  Malignant neoplasm of prostate ([place])  08/01/2021 Cancer Staging   Staging form: Prostate, AJCC 8th Edition - Clinical stage from 08/01/2021: Stage IIC (cT1c, cN0, cM0, PSA: 9.5, Grade Group: 4) - Signed by Freeman Caldron, PA-C on 08/29/2021 Histopathologic type: Adenocarcinoma, NOS Stage prefix: Initial diagnosis Prostate specific antigen (PSA) range: Less than 10 Gleason primary pattern: 3 Gleason secondary pattern: 5 Gleason score: 8 Histologic grading system: 5 grade system Number of biopsy cores examined: 16 Number of biopsy cores positive: 4 Location of positive needle core biopsies: Both sides    08/29/2021 Initial Diagnosis   Malignant neoplasm of prostate ([place])    Genetic Testing   [facility] CustomNext Panel was Negative. Report date is 09/19/2021.  The CustomNext-Cancer+RNAinsight panel offered by Althia Forts includes sequencing and rearrangement analysis for the following 47 genes:  APC, ATM, AXIN2, BARD1, BMPR1A, BRCA1, BRCA2, BRIP1, CDH1, CDK4, CDKN2A, CHEK2, CTNNA1, DICER1, EPCAM, GREM1, HOXB13, KIT, MEN1, MLH1, MSH2, MSH3, MSH6, MUTYH, NBN, NF1, NTHL1, PALB2, PDGFRA, PMS2, POLD1, POLE, PTEN, RAD50, RAD51C, RAD51D, SDHA, SDHB, SDHC, SDHD, SMAD4, SMARCA4, STK11, TP53, TSC1, TSC2, and VHL.  RNA data is routinely analyzed for use in variant interpretation for all genes.      FAMILY HISTORY:  We obtained a detailed, 4-generation family  history.  There is no known family history of cancer.      [name] is unaware of previous family history of genetic testing for hereditary cancer risks. There no known consanguinity.  GENETIC TEST RESULTS:  The [facility] CustomNext Panel found no pathogenic mutations.   The CustomNext-Cancer+RNAinsight panel offered by Althia Forts includes sequencing and rearrangement analysis for the following 47 genes:  APC, ATM, AXIN2, BARD1, BMPR1A, BRCA1, BRCA2, BRIP1, CDH1, CDK4, CDKN2A, CHEK2, CTNNA1, DICER1, EPCAM, GREM1, HOXB13, KIT, MEN1, MLH1, MSH2, MSH3, MSH6, MUTYH, NBN, NF1, NTHL1, PALB2, PDGFRA, PMS2, POLD1, POLE, PTEN, RAD50, RAD51C, RAD51D, SDHA, SDHB, SDHC, SDHD, SMAD4, SMARCA4, STK11, TP53, TSC1, TSC2, and VHL.  RNA data is routinely analyzed for use in variant interpretation for all genes.  The test report has been scanned into EPIC and is located under the Molecular Pathology section of the Results Review tab.  A portion of the result report is included below for reference. Genetic testing reported out on 09/19/2021.        Even though a pathogenic variant was not identified, possible explanations for his personal history of cancer may include: There may be no hereditary risk for cancer in the family. [name] cancer may be due to other genetic or environmental factors. There may be a gene mutation in one of these genes that current testing methods cannot detect, but that chance is small. There could be another gene that has not yet been discovered, or that we have not yet tested, that is responsible for the cancer diagnoses in the family.   Therefore, it is important to remain in touch with cancer genetics in the future so that we can continue to offer [name] the most up to date  genetic testing.   ADDITIONAL GENETIC TESTING:  We discussed with [name] that his genetic testing was fairly extensive.  If there are genes identified to increase cancer risk that can be  analyzed in the future, we would be happy to discuss and coordinate this testing at that time.    CANCER SCREENING RECOMMENDATIONS:  [name] test result is considered negative (normal).  This means that we have not identified a hereditary cause for his family history of cancer at this time. Most cancers happen by chance and this negative test suggests that his cancer may fall into this category.    An individual's cancer risk and medical management are not determined by genetic test results alone. Overall cancer risk assessment incorporates additional factors, including personal medical history, family history, and any available genetic information that may result in a personalized plan for cancer prevention and surveillance. Therefore, it is recommended he continue to follow the cancer management and screening guidelines provided by his oncology and primary healthcare provider.  RECOMMENDATIONS FOR FAMILY MEMBERS:   Since he did not inherit a mutation in a cancer predisposition gene included on this panel, his son could not have inherited a mutation from him in one of these genes.  FOLLOW-UP:  Cancer genetics is a rapidly advancing field and it is possible that new genetic tests will be appropriate for him and/or his family members in the future. We encouraged him to remain in contact with cancer genetics on an annual basis so we can update his personal and family histories and let him know of advances in cancer genetics that may benefit this family.   Our contact number was provided. [name] questions were answered to his satisfaction, and he knows he is welcome to call us at anytime with additional questions or concerns.   Lucille Passy, MS, [facility] Genetic Counselor [email or web address] (P) [phone]

## 2021-10-13 ENCOUNTER — Other Ambulatory Visit: Payer: Self-pay | Admitting: Urology

## 2021-10-16 DIAGNOSIS — M25562 Pain in left knee: Secondary | ICD-10-CM | POA: Diagnosis not present

## 2021-10-17 NOTE — Progress Notes (Signed)
Pt called with concerns regarding billing and payments.  Pt was provided with [facility]illing Department's number previously, however, he is questions were not being answered.    RN placed request to revenue cycle to reach out to patient.  Patient verbalized understanding and thankfulness.

## 2021-10-18 DIAGNOSIS — R8271 Bacteriuria: Secondary | ICD-10-CM | POA: Diagnosis not present

## 2021-10-18 DIAGNOSIS — N401 Enlarged prostate with lower urinary tract symptoms: Secondary | ICD-10-CM | POA: Diagnosis not present

## 2021-10-18 DIAGNOSIS — R3914 Feeling of incomplete bladder emptying: Secondary | ICD-10-CM | POA: Diagnosis not present

## 2021-10-18 DIAGNOSIS — C61 Malignant neoplasm of prostate: Secondary | ICD-10-CM | POA: Diagnosis not present

## 2021-10-23 DIAGNOSIS — M25562 Pain in left knee: Secondary | ICD-10-CM | POA: Diagnosis not present

## 2021-10-25 DIAGNOSIS — M25562 Pain in left knee: Secondary | ICD-10-CM | POA: Diagnosis not present

## 2021-11-01 DIAGNOSIS — M25562 Pain in left knee: Secondary | ICD-10-CM | POA: Diagnosis not present

## 2021-11-03 DIAGNOSIS — M25562 Pain in left knee: Secondary | ICD-10-CM | POA: Diagnosis not present

## 2021-11-08 DIAGNOSIS — M25562 Pain in left knee: Secondary | ICD-10-CM | POA: Diagnosis not present

## 2021-11-10 DIAGNOSIS — M25562 Pain in left knee: Secondary | ICD-10-CM | POA: Diagnosis not present

## 2021-11-13 DIAGNOSIS — M25562 Pain in left knee: Secondary | ICD-10-CM | POA: Diagnosis not present

## 2021-11-13 NOTE — Patient Instructions (Addendum)
DUE TO COVID-19 ONLY ONE VISITOR IS ALLOWED TO COME WITH YOU AND STAY IN THE WAITING ROOM ONLY DURING PRE OP AND PROCEDURE DAY OF SURGERY IF YOU ARE GOING HOME AFTER SURGERY. IF YOU ARE SPENDING THE NIGHT 2 PEOPLE MAY VISIT WITH YOU IN YOUR PRIVATE ROOM AFTER SURGERY UNTIL VISITING  HOURS ARE OVER AT 8:00 PM AND 1  VISITOR  MAY  SPEND THE NIGHT.   YOU NEED TO HAVE A COVID 19 TEST ON___1/19____ @__9 :00 am_____, THIS TEST MUST BE DONE BEFORE SURGERY,  Come in through the main entrance of [facility].  Sit in the chairs to the right and call [phone] to let them know you are here and they will come and get you.  You do not need to stop at admitting.                Thomas [name]     Your procedure is scheduled on: 11/27/21   Report to [facility] Main  Entrance   Report to admitting at  7:45 AM     Call this number if you have problems the morning of surgery [phone]   Follow any bowel prep and diet instructions from the Dr's office   Remember: Do not eat food or drink :After Midnight the night before your surgery,        BRUSH YOUR TEETH MORNING OF SURGERY AND RINSE YOUR MOUTH OUT, NO CHEWING GUM [place].     Take these medicines the morning of surgery with A SIP OF WATER: Metoprolol, Tamsulosin  Stop taking ASA 81 mg___________on ___1/10/23_______as instructed by _Dr. Borden___________.                                  You may not have any metal on your body including              piercings  Do not wear jewelry, lotions, powders or deodorant             Men may shave face and neck.   Do not bring valuables to the hospital. [place].  Contacts, dentures or bridgework may not be worn into surgery.  Leave suitcase in the car. After surgery it may be brought to your room.     _____________________________________________________________________             [facility] - Preparing for  Surgery Before surgery, you can play an important role.  Because skin is not sterile, your skin needs to be as free of germs as possible.  You can reduce the number of germs on your skin by washing with CHG (chlorahexidine gluconate) soap before surgery.  CHG is an antiseptic cleaner which kills germs and bonds with the skin to continue killing germs even after washing. Please DO NOT use if you have an allergy to CHG or antibacterial soaps.  If your skin becomes reddened/irritated stop using the CHG and inform your nurse when you arrive at Short Stay.  You may shave your face/neck. Please follow these instructions carefully:  1.  Shower with CHG Soap the night before surgery and the  morning of Surgery.  2.  If you choose to wash your hair, wash your hair first as usual with your  normal  shampoo.  3.  After you shampoo,  rinse your hair and body thoroughly to remove the  shampoo.                            4.  Use CHG as you would any other liquid soap.  You can apply chg directly  to the skin and wash                       Gently with a scrungie or clean washcloth.  5.  Apply the CHG Soap to your body ONLY FROM THE NECK DOWN.   Do not use on face/ open                           Wound or open sores. Avoid contact with eyes, ears mouth and genitals (private parts).                       Wash face,  Genitals (private parts) with your normal soap.             6.  Wash thoroughly, paying special attention to the area where your surgery  will be performed.  7.  Thoroughly rinse your body with warm water from the neck down.  8.  DO NOT shower/wash with your normal soap after using and rinsing off  the CHG Soap.                9.  Pat yourself dry with a clean towel.            10.  Wear clean pajamas.            11.  Place clean sheets on your bed the night of your first shower and do not  sleep with pets. Day of Surgery : Do not apply any lotions/deodorants the morning of surgery.  Please wear clean  clothes to the hospital/surgery center.  FAILURE TO FOLLOW THESE INSTRUCTIONS MAY RESULT IN THE CANCELLATION OF YOUR SURGERY PATIENT SIGNATURE_________________________________  NURSE SIGNATURE__________________________________  ________________________________________________________________________

## 2021-11-14 ENCOUNTER — Other Ambulatory Visit: Payer: Self-pay

## 2021-11-14 ENCOUNTER — Encounter (HOSPITAL_COMMUNITY)
Admission: RE | Admit: 2021-11-14 | Discharge: 2021-11-14 | Disposition: A | Discharge status: Home / Self Care | Payer: PPO | Source: Ambulatory Visit | Attending: Urology | Admitting: Urology

## 2021-11-14 ENCOUNTER — Encounter (HOSPITAL_COMMUNITY): Payer: Self-pay

## 2021-11-14 VITALS — BP 125/69 | HR 71 | Temp 97.9°F | Resp 18 | Ht 70.0 in | Wt 181.1 lb

## 2021-11-14 DIAGNOSIS — Z01812 Encounter for preprocedural laboratory examination: Secondary | ICD-10-CM | POA: Insufficient documentation

## 2021-11-14 DIAGNOSIS — Z01818 Encounter for other preprocedural examination: Secondary | ICD-10-CM

## 2021-11-14 DIAGNOSIS — N401 Enlarged prostate with lower urinary tract symptoms: Secondary | ICD-10-CM | POA: Diagnosis not present

## 2021-11-14 DIAGNOSIS — R3914 Feeling of incomplete bladder emptying: Secondary | ICD-10-CM | POA: Diagnosis not present

## 2021-11-14 DIAGNOSIS — C61 Malignant neoplasm of prostate: Secondary | ICD-10-CM | POA: Diagnosis not present

## 2021-11-14 LAB — CBC
HCT: 40.8 % (ref 39.0–52.0)
Hemoglobin: 13.8 g/dL (ref 13.0–17.0)
MCH: 33.5 pg (ref 26.0–34.0)
MCHC: 33.8 g/dL (ref 30.0–36.0)
MCV: 99 fL (ref 80.0–100.0)
Platelets: 226 10*3/uL (ref 150–400)
RBC: 4.12 MIL/uL — ABNORMAL LOW (ref 4.22–5.81)
RDW: 12.7 % (ref 11.5–15.5)
WBC: 7.8 10*3/uL (ref 4.0–10.5)
nRBC: 0 % (ref 0.0–0.2)

## 2021-11-14 LAB — BASIC METABOLIC PANEL
Anion gap: 8 (ref 5–15)
BUN: 27 mg/dL — ABNORMAL HIGH (ref 8–23)
CO2: 27 mmol/L (ref 22–32)
Calcium: 9 mg/dL (ref 8.9–10.3)
Chloride: 104 mmol/L (ref 98–111)
Creatinine, Ser: 0.81 mg/dL (ref 0.61–1.24)
GFR, Estimated: >60 mL/min (ref >60)
Glucose, Bld: 105 mg/dL — ABNORMAL HIGH (ref 70–99)
Potassium: 4 mmol/L (ref 3.5–5.1)
Sodium: 139 mmol/L (ref 135–145)

## 2021-11-14 NOTE — Progress Notes (Signed)
COVID test- 11/23/21 at 9:00 am  PCP - Dr. Charlynne Cousins Cardiologist - Dr. Derl Barrow  Chest x-ray - no EKG - 02/14/21-epic Stress Test - no ECHO - 03/09/21-epic Cardiac Cath - 12/23/20 -epic Pacemaker/ICD device last checked:NA  Sleep Study - no CPAP -   Fasting Blood Sugar - NA Checks Blood Sugar _____ times a day  Blood Thinner Instructions:ASA 81 mg/ Dr. [name] Aspirin Instructions:stop 5 days prior to DOS/ [name] Last Dose:Pt stopped 11/14/21  Anesthesia review  yes:   Patient denies shortness of breath, fever, cough and chest pain at PAT appointment Pt reports no SOB with activities  Patient verbalized understanding of instructions that were given to them at the PAT appointment. Patient was also instructed that they will need to review over the PAT instructions again at home before surgery. yes

## 2021-11-17 DIAGNOSIS — M25562 Pain in left knee: Secondary | ICD-10-CM | POA: Diagnosis not present

## 2021-11-20 DIAGNOSIS — M25562 Pain in left knee: Secondary | ICD-10-CM | POA: Diagnosis not present

## 2021-11-22 DIAGNOSIS — M25562 Pain in left knee: Secondary | ICD-10-CM | POA: Diagnosis not present

## 2021-11-23 ENCOUNTER — Other Ambulatory Visit: Payer: Self-pay

## 2021-11-23 ENCOUNTER — Encounter (HOSPITAL_COMMUNITY)
Admission: RE | Admit: 2021-11-23 | Discharge: 2021-11-23 | Disposition: A | Discharge status: Home / Self Care | Payer: PPO | Source: Ambulatory Visit | Attending: Urology | Admitting: Urology

## 2021-11-23 DIAGNOSIS — Z20822 Contact with and (suspected) exposure to covid-19: Secondary | ICD-10-CM | POA: Diagnosis not present

## 2021-11-23 DIAGNOSIS — Z01818 Encounter for other preprocedural examination: Secondary | ICD-10-CM

## 2021-11-23 DIAGNOSIS — Z01812 Encounter for preprocedural laboratory examination: Secondary | ICD-10-CM | POA: Insufficient documentation

## 2021-11-23 LAB — SARS CORONAVIRUS 2 (TAT 6-24 HRS): SARS Coronavirus 2: NEGATIVE

## 2021-11-24 NOTE — H&P (Signed)
Office Visit Report     11/14/2021   --------------------------------------------------------------------------------   Thomas [name]  MRN: [id number]  DOB: 1946/07/18, 76 year old Male  SSN: [id number]   PRIMARY CARE:  Thomas [name]. Thomas [name]), MD  REFERRING:  Thomas [name]. Thomas [name]), MD  PROVIDER:  Raynelle [name], M.D.  LOCATION:  [facility] [phone]     --------------------------------------------------------------------------------   CC/HPI: 1. Prostate cancer  2. BPH/LUTS   [name] returns today for the above urologic issues. He does have high-grade prostate cancer and began androgen deprivation therapy in the fall with plans to proceed with definitive radiation therapy after recovery from his quadriceps tendon repair. In addition, we have agreed to proceed with treatment of his lower urinary tract symptoms proactively. Considering the multiple episodes of urinary retention and infectious complications, we agreed to proceed with a TURP prior to radiation therapy. As such, he is scheduled on 11/27/2021 to undergo cystoscopy, transurethral resection of the prostate, fiducial marker placement, and SpaceOAR hydrogel insertion but to address his lower urinary tract symptoms definitively and to prepare him for radiation. Currently, he remains on tamsulosin 0.4 mg.     ALLERGIES: No Allergies    MEDICATIONS: Aspirin 81 mg tablet,chewable 1 tablet PO Daily  Finasteride 5 mg tablet 1 tablet PO Daily  Levofloxacin 750 mg tablet Please take one tablet the morning of your biopsy.  Levofloxacin 750 mg tablet Please take one tablet the morning of your biopsy.  Metoprolol Tartrate  Tamsulosin Hcl 0.4 mg capsule 1 capsule PO Q HS  Tamsulosin Hcl 0.4 mg capsule     GU PSH: Catheterize For Residual - 03/31/2021 Prostate Needle Biopsy - 08/01/2021, 2019 Vasectomy - 2013       [facility] Notes: Surgery Of Male Genitalia Vasectomy   NON-GU PSH: Hernia  Repair Mitral valve replacement Surgical Pathology, Gross And Microscopic Examination For Prostate Needle - 08/01/2021, 2019     GU PMH: BPH w/LUTS - 10/18/2021, - 08/29/2021, - 04/26/2021 Incomplete bladder emptying - 10/18/2021, - 04/26/2021, - 03/31/2021 Prostate Cancer - 10/18/2021, - 09/18/2021, - 08/29/2021, - 08/01/2021, - 04/26/2021, - 09/21/2020, Prostate cancer, - 2016 Straining on Urination - 08/29/2021, - 03/31/2021 Acute Cystitis/UTI - 03/31/2021 Dysuria - 03/31/2021 Urinary Frequency - 03/31/2021 Urinary Urgency - 03/31/2021 Urinary Retention - 03/15/2021, - 03/14/2021, - 02/22/2021, - 02/14/2021, - 02/01/2021      PMH Notes:   1) Prostate cancer: He was initially evaluated in September 2013 for an elevated PSA of 5.37 prompting a prostate biopsy at that time which indicated HGPIN atypia at the left lateral base. For this reason, he underwent a follow up repeat biopsy on 03/27/13 which demonstrated Gleason 3+3=6 adenocarcinoma in < 5% of 1 out of 16 biopsy cores. This was reviewed by Dr. Tommie [name] at [facility]. After discussing options for treatment/management, he elected to proceed with active surveillance. He was determined to have upgraded Gleason 8 disease on an MR/US fusion biopsy in September 2022. He was evaluated in the [facility] and elected to proceed with long term ADT and EBRT although wished to undergo TURP prior to EBRT due to his baseline voiding symptoms and BPH.   Initial diagnosis: May 2014  TNM stage: cT1c Nx Mx  PSA: 5.37  Gleason score: 3+3=6  Biopsy (03/27/13): 1/16 cores -- L mid (<5%), Vol 76.3 cc  PSAD: 0.06   Surveillance  Jan 2015: MRI - 1.6 cm nodule at R central base  Jan 2015: 26 core biopsy - 1/26 cores  positive -- L mid (5%, 3+3=6), Vol 87.6 cc  Feb 2017: 12 core biopsy - benign, Vol 85.2 cc  Feb 2019: 12 core biopsy - 1/12 cores positive - L base (5%, 3+3=6), Vol 105.5 cc  May 2022: MRI - 2.5 cm PI RADS 3 lesion of left apical transition zone  Sep 2022: MR/US fusion  biopsy - upgraded Gleason 3+5=8 adenocarcinoma in 3 out of 4 targeted biopsies, Vol 103.8 cc  Nov 2022: Started ADT   Baseline urinary function: He has minimal baseline voiding symptoms. IPSS is 5.  Baseline erectile function: He denies erectile dysfunction. SHIM score is 22.   2) BPH/LUTS: He has a known enlarged prostate (> 100 cc). He developed postoperative urinary retention after his mitral valve repair in 2022.   Current treatment: Tamsulosin 0.4 mg     NON-GU PMH: Bacteriuria - 10/18/2021, (Stable), - 08/01/2021, - 04/26/2021 Nonrheumatic mitral (valve) prolapse    FAMILY HISTORY: Coronary Artery Disease - Runs In Family Death In The Family Father - Runs In Family Death In The Family Mother - Runs In Family No pertinent family history - Other   SOCIAL HISTORY: Marital Status: Single Preferred Language: English; Ethnicity: Not Hispanic Or Latino; Race: White     Notes: Never A Smoker, Alcohol Use, Occupation:, Tobacco Use   REVIEW OF SYSTEMS:    GU Review Male:   Patient denies frequent urination, hard to postpone urination, burning/ pain with urination, get up at night to urinate, leakage of urine, stream starts and stops, trouble starting your streams, and have to strain to urinate .  Gastrointestinal (Upper):   Patient denies nausea and vomiting.  Gastrointestinal (Lower):   Patient denies diarrhea and constipation.  Constitutional:   Patient denies fever, night sweats, weight loss, and fatigue.  Skin:   Patient denies skin rash/ lesion and itching.  Eyes:   Patient denies blurred vision and double vision.  Ears/ Nose/ Throat:   Patient denies sinus problems and sore throat.  Hematologic/Lymphatic:   Patient denies swollen glands and easy bruising.  Cardiovascular:   Patient denies leg swelling and chest pains.  Respiratory:   Patient denies cough and shortness of breath.  Endocrine:   Patient denies excessive thirst.  Musculoskeletal:   Patient denies back pain and joint  pain.  Neurological:   Patient denies headaches and dizziness.  Psychologic:   Patient denies depression and anxiety.   VITAL SIGNS:      11/14/2021 10:44 AM  Weight 173 lb / 78.47 kg  Height 70 in / 177.8 cm  BP 119/66 mmHg  Pulse 69 /min  Temperature 97.8 F / 36.5 C  BMI 24.8 kg/m   MULTI-SYSTEM PHYSICAL EXAMINATION:    Constitutional: Well-nourished. No physical deformities. Normally developed. Good grooming.  Respiratory: No labored breathing, no use of accessory muscles. Clear bilaterally.  Cardiovascular: Normal temperature, normal extremity pulses, no swelling, no varicosities. Regular rate and rhythm.     Complexity of Data:  Lab Test Review:   PSA  Records Review:   Previous Patient Records  Urodynamics Review:   Review Bladder Scan   05/26/21 04/18/21 09/13/20 03/02/20 09/02/19 01/30/19 06/06/18 09/19/17  PSA  Total PSA 9.48 ng/mL 18.50 ng/mL 9.40 ng/mL 8.01 ng/mL 9.79 ng/mL 8.44 ng/mL 7.96 ng/mL 9.05 ng/mL    PROCEDURES:         PVR Ultrasound - 51798  Scanned Volume: 12 cc         Urinalysis - 81003 Dipstick Dipstick Cont'd  Color: Yellow Bilirubin:  Neg mg/dL  Appearance: Clear Ketones: Neg mg/dL  Specific Gravity: 1.025 Blood: Neg ery/uL  pH: <=5.0 Protein: Neg mg/dL  Glucose: Neg mg/dL Urobilinogen: 0.2 mg/dL    Nitrites: Neg    Leukocyte Esterase: Neg leu/uL    ASSESSMENT:      ICD-10 Details  1 GU:   Prostate Cancer - C61   2   BPH w/LUTS - N40.1   3   Incomplete bladder emptying - R39.14    PLAN:           Schedule Return Visit/Planned Activity: Keep Scheduled Appointment          Document Letter(s):  Created for Patient: Clinical Summary         Notes:   1. High risk prostate cancer: He will undergo SpaceOAR insertion and fiducial marker placement at the time of his upcoming transurethral resection of the prostate. He is then scheduled to see me in early March for reassessment. If doing well at that time, he can then proceed safely with  radiation therapy. He also is scheduled to follow-up with me in May for his next PSA and to continue androgen deprivation therapy. He would be due to continue androgen deprivation therapy at least through the spring 2024.   2. BPH/LUTS: His PVR is actually relatively low today. In addition, he has no evidence of bacteriuria. He therefore does not need additional antibiotics leading up to his procedure. We reviewed proceeding with cystoscopy and transurethral resection of the prostate today including the potential risks, complications, and expected recovery process. All questions were answered to his satisfaction and he gives informed consent to proceed as scheduled.   CC: Dr. [name]  Dr. Tyler Pita        Next Appointment:      Next Appointment: 11/27/2021 10:00 AM    Appointment Type: Surgery     Location: [facility] [phone]    Provider: Raynelle [name], M.D.    Reason for Visit: WL/EXT REC CYSTO, TURP, FIDUCIAL MARKER AND SPACE OAR      * Signed by Thomas [name], M.D. on 11/14/21 at 2:17 PM (EST*

## 2021-11-27 ENCOUNTER — Ambulatory Visit (HOSPITAL_COMMUNITY): Payer: PPO | Admitting: Physician Assistant

## 2021-11-27 ENCOUNTER — Encounter (HOSPITAL_COMMUNITY)
Admission: RE | Disposition: A | Discharge status: Home / Self Care | Payer: Self-pay | Source: Ambulatory Visit | Attending: Urology

## 2021-11-27 ENCOUNTER — Ambulatory Visit (HOSPITAL_COMMUNITY): Payer: PPO | Admitting: Certified Registered"

## 2021-11-27 ENCOUNTER — Observation Stay (HOSPITAL_COMMUNITY)
Admission: RE | Admit: 2021-11-27 | Discharge: 2021-11-28 | Disposition: A | Discharge status: Home / Self Care | Payer: PPO | Source: Ambulatory Visit | Attending: Urology | Admitting: Urology

## 2021-11-27 ENCOUNTER — Encounter (HOSPITAL_COMMUNITY): Payer: Self-pay | Admitting: Urology

## 2021-11-27 ENCOUNTER — Telehealth: Payer: Self-pay | Admitting: *Deleted

## 2021-11-27 ENCOUNTER — Other Ambulatory Visit: Payer: Self-pay

## 2021-11-27 DIAGNOSIS — R3914 Feeling of incomplete bladder emptying: Secondary | ICD-10-CM | POA: Insufficient documentation

## 2021-11-27 DIAGNOSIS — N401 Enlarged prostate with lower urinary tract symptoms: Secondary | ICD-10-CM | POA: Diagnosis not present

## 2021-11-27 DIAGNOSIS — Z79899 Other long term (current) drug therapy: Secondary | ICD-10-CM | POA: Diagnosis not present

## 2021-11-27 DIAGNOSIS — C61 Malignant neoplasm of prostate: Secondary | ICD-10-CM | POA: Diagnosis not present

## 2021-11-27 DIAGNOSIS — N320 Bladder-neck obstruction: Secondary | ICD-10-CM | POA: Diagnosis not present

## 2021-11-27 DIAGNOSIS — N400 Enlarged prostate without lower urinary tract symptoms: Secondary | ICD-10-CM | POA: Diagnosis present

## 2021-11-27 DIAGNOSIS — N138 Other obstructive and reflux uropathy: Secondary | ICD-10-CM | POA: Diagnosis not present

## 2021-11-27 HISTORY — PX: TRANSRECTAL ULTRASOUND: SHX5146

## 2021-11-27 HISTORY — PX: GOLD SEED IMPLANT: SHX6343

## 2021-11-27 HISTORY — PX: SPACE OAR INSTILLATION: SHX6769

## 2021-11-27 HISTORY — PX: TRANSURETHRAL RESECTION OF PROSTATE: SHX73

## 2021-11-27 SURGERY — TURP (TRANSURETHRAL RESECTION OF PROSTATE)
Anesthesia: General | Site: Rectum

## 2021-11-27 MED ORDER — CHLORHEXIDINE GLUCONATE CLOTH 2 % EX PADS: 6.0000 | MEDICATED_PAD | Freq: Every day | CUTANEOUS | Status: DC

## 2021-11-27 MED ORDER — LACTATED RINGERS IV SOLN: INTRAVENOUS | Status: DC

## 2021-11-27 MED ORDER — DIPHENHYDRAMINE HCL 12.5 MG/5ML PO ELIX: 12.5000 mg | ORAL_SOLUTION | Freq: Four times a day (QID) | ORAL | Status: DC | PRN

## 2021-11-27 MED ORDER — LIDOCAINE 2% (20 MG/ML) 5 ML SYRINGE
INTRAMUSCULAR | Status: DC | PRN
Start: 2021-11-27 — End: 2021-11-27
  Administered 2021-11-27: 40 mg via INTRAVENOUS

## 2021-11-27 MED ORDER — SUGAMMADEX SODIUM 200 MG/2ML IV SOLN
INTRAVENOUS | Status: DC | PRN
  Administered 2021-11-27: 180 mg via INTRAVENOUS

## 2021-11-27 MED ORDER — ONDANSETRON HCL 4 MG/2ML IJ SOLN
INTRAMUSCULAR | Status: DC | PRN
  Administered 2021-11-27: 4 mg via INTRAVENOUS

## 2021-11-27 MED ORDER — SODIUM CHLORIDE 0.9% FLUSH: 3.0000 mL | INTRAVENOUS | Status: DC | PRN

## 2021-11-27 MED ORDER — FENTANYL CITRATE (PF) 250 MCG/5ML IJ SOLN
INTRAMUSCULAR | Status: DC | PRN
  Administered 2021-11-27 (×2): 50 ug via INTRAVENOUS

## 2021-11-27 MED ORDER — SODIUM CHLORIDE 0.9 % IV SOLN
2.0000 g | Freq: Once | INTRAVENOUS | Status: AC
  Administered 2021-11-27: 2 g via INTRAVENOUS
  Filled 2021-11-27: qty 20

## 2021-11-27 MED ORDER — DEXAMETHASONE SODIUM PHOSPHATE 10 MG/ML IJ SOLN
INTRAMUSCULAR | Status: AC
  Filled 2021-11-27: qty 1

## 2021-11-27 MED ORDER — ONDANSETRON HCL 4 MG/2ML IJ SOLN: 4.0000 mg | INTRAMUSCULAR | Status: DC | PRN

## 2021-11-27 MED ORDER — ACETAMINOPHEN 325 MG PO TABS
650.0000 mg | ORAL_TABLET | Freq: Four times a day (QID) | ORAL | Status: DC | PRN
  Administered 2021-11-28: 05:00:00 650 mg via ORAL
  Filled 2021-11-27: qty 2

## 2021-11-27 MED ORDER — FLEET ENEMA 7-19 GM/118ML RE ENEM: 1.0000 | ENEMA | Freq: Once | RECTAL | Status: DC

## 2021-11-27 MED ORDER — SODIUM CHLORIDE 0.9 % IR SOLN
Status: DC | PRN
  Administered 2021-11-27: 24000 mL via INTRAVESICAL
  Administered 2021-11-27: 3000 mL

## 2021-11-27 MED ORDER — PHENYLEPHRINE HCL (PRESSORS) 10 MG/ML IV SOLN
INTRAVENOUS | Status: AC
  Filled 2021-11-27: qty 1

## 2021-11-27 MED ORDER — SODIUM CHLORIDE 0.45 % IV SOLN: INTRAVENOUS | Status: DC

## 2021-11-27 MED ORDER — ONDANSETRON HCL 4 MG/2ML IJ SOLN: 4.0000 mg | Freq: Once | INTRAMUSCULAR | Status: DC | PRN

## 2021-11-27 MED ORDER — LIDOCAINE HCL (PF) 2 % IJ SOLN
INTRAMUSCULAR | Status: AC
  Filled 2021-11-27: qty 5

## 2021-11-27 MED ORDER — ORAL CARE MOUTH RINSE: 15.0000 mL | Freq: Once | OROMUCOSAL | Status: AC

## 2021-11-27 MED ORDER — FENTANYL CITRATE (PF) 100 MCG/2ML IJ SOLN
INTRAMUSCULAR | Status: AC
  Filled 2021-11-27: qty 2

## 2021-11-27 MED ORDER — SODIUM CHLORIDE 0.9 % IV SOLN: 250.0000 mL | INTRAVENOUS | Status: DC | PRN

## 2021-11-27 MED ORDER — SODIUM CHLORIDE 0.9 % IR SOLN
3000.0000 mL | Status: DC
  Administered 2021-11-27: 3000 mL

## 2021-11-27 MED ORDER — FENTANYL CITRATE PF 50 MCG/ML IJ SOSY: 25.0000 ug | PREFILLED_SYRINGE | INTRAMUSCULAR | Status: DC | PRN

## 2021-11-27 MED ORDER — BELLADONNA ALKALOIDS-OPIUM 16.2-60 MG RE SUPP: 1.0000 | Freq: Four times a day (QID) | RECTAL | Status: DC | PRN

## 2021-11-27 MED ORDER — DIPHENHYDRAMINE HCL 50 MG/ML IJ SOLN: 12.5000 mg | Freq: Four times a day (QID) | INTRAMUSCULAR | Status: DC | PRN

## 2021-11-27 MED ORDER — OXYCODONE HCL 5 MG/5ML PO SOLN: 5.0000 mg | Freq: Once | ORAL | Status: DC | PRN

## 2021-11-27 MED ORDER — SODIUM CHLORIDE (PF) 0.9 % IJ SOLN
INTRAMUSCULAR | Status: AC
  Filled 2021-11-27: qty 10

## 2021-11-27 MED ORDER — DOCUSATE SODIUM 100 MG PO CAPS
100.0000 mg | ORAL_CAPSULE | Freq: Two times a day (BID) | ORAL | Status: DC
  Administered 2021-11-27 – 2021-11-28 (×2): 100 mg via ORAL
  Filled 2021-11-27 (×2): qty 1

## 2021-11-27 MED ORDER — METOPROLOL TARTRATE 25 MG PO TABS
12.5000 mg | ORAL_TABLET | Freq: Two times a day (BID) | ORAL | Status: DC
  Administered 2021-11-27 – 2021-11-28 (×2): 12.5 mg via ORAL
  Filled 2021-11-27 (×2): qty 1

## 2021-11-27 MED ORDER — STERILE WATER FOR IRRIGATION IR SOLN
Status: DC | PRN
  Administered 2021-11-27: 1000 mL

## 2021-11-27 MED ORDER — PHENYLEPHRINE 40 MCG/ML (10ML) SYRINGE FOR IV PUSH (FOR BLOOD PRESSURE SUPPORT)
PREFILLED_SYRINGE | INTRAVENOUS | Status: DC | PRN
  Administered 2021-11-27 (×3): 60 ug via INTRAVENOUS
  Administered 2021-11-27: 80 ug via INTRAVENOUS

## 2021-11-27 MED ORDER — DOCUSATE SODIUM 100 MG PO CAPS: 100.0000 mg | ORAL_CAPSULE | Freq: Two times a day (BID) | ORAL | 0 refills | Status: DC

## 2021-11-27 MED ORDER — PHENYLEPHRINE 40 MCG/ML (10ML) SYRINGE FOR IV PUSH (FOR BLOOD PRESSURE SUPPORT)
PREFILLED_SYRINGE | INTRAVENOUS | Status: AC
  Filled 2021-11-27: qty 10

## 2021-11-27 MED ORDER — CHLORHEXIDINE GLUCONATE 0.12 % MT SOLN
15.0000 mL | Freq: Once | OROMUCOSAL | Status: AC
  Administered 2021-11-27: 15 mL via OROMUCOSAL

## 2021-11-27 MED ORDER — TRAMADOL HCL 50 MG PO TABS: 50.0000 mg | ORAL_TABLET | Freq: Four times a day (QID) | ORAL | 0 refills | Status: DC | PRN

## 2021-11-27 MED ORDER — SODIUM CHLORIDE 0.9% FLUSH
3.0000 mL | Freq: Two times a day (BID) | INTRAVENOUS | Status: DC
  Administered 2021-11-28: 09:00:00 3 mL via INTRAVENOUS

## 2021-11-27 MED ORDER — OXYCODONE HCL 5 MG PO TABS: 5.0000 mg | ORAL_TABLET | Freq: Once | ORAL | Status: DC | PRN

## 2021-11-27 MED ORDER — SODIUM CHLORIDE FLUSH 0.9 % IV SOLN
INTRAVENOUS | Status: DC | PRN
  Administered 2021-11-27: 3 mL via INTRAVENOUS

## 2021-11-27 MED ORDER — PROPOFOL 10 MG/ML IV BOLUS
INTRAVENOUS | Status: DC | PRN
  Administered 2021-11-27: 200 mg via INTRAVENOUS

## 2021-11-27 MED ORDER — ACETAMINOPHEN 10 MG/ML IV SOLN
1000.0000 mg | Freq: Once | INTRAVENOUS | Status: DC | PRN
  Administered 2021-11-27: 1000 mg via INTRAVENOUS

## 2021-11-27 MED ORDER — ROCURONIUM BROMIDE 10 MG/ML (PF) SYRINGE
PREFILLED_SYRINGE | INTRAVENOUS | Status: DC | PRN
  Administered 2021-11-27: 10 mg via INTRAVENOUS
  Administered 2021-11-27: 40 mg via INTRAVENOUS

## 2021-11-27 MED ORDER — PROPOFOL 10 MG/ML IV BOLUS
INTRAVENOUS | Status: AC
  Filled 2021-11-27: qty 20

## 2021-11-27 MED ORDER — SUCCINYLCHOLINE CHLORIDE 200 MG/10ML IV SOSY
PREFILLED_SYRINGE | INTRAVENOUS | Status: DC | PRN
  Administered 2021-11-27: 120 mg via INTRAVENOUS

## 2021-11-27 MED ORDER — DEXAMETHASONE SODIUM PHOSPHATE 10 MG/ML IJ SOLN
INTRAMUSCULAR | Status: DC | PRN
  Administered 2021-11-27: 4 mg via INTRAVENOUS

## 2021-11-27 MED ORDER — ACETAMINOPHEN 10 MG/ML IV SOLN
INTRAVENOUS | Status: AC
  Filled 2021-11-27: qty 100

## 2021-11-27 MED ORDER — ONDANSETRON HCL 4 MG/2ML IJ SOLN
INTRAMUSCULAR | Status: AC
  Filled 2021-11-27: qty 2

## 2021-11-27 MED ORDER — ZOLPIDEM TARTRATE 5 MG PO TABS
5.0000 mg | ORAL_TABLET | Freq: Every evening | ORAL | Status: DC | PRN
  Administered 2021-11-27: 5 mg via ORAL
  Filled 2021-11-27: qty 1

## 2021-11-27 MED ORDER — HYDROCODONE-ACETAMINOPHEN 5-325 MG PO TABS
1.0000 | ORAL_TABLET | ORAL | Status: DC | PRN
  Administered 2021-11-27: 2 via ORAL
  Filled 2021-11-27: qty 2

## 2021-11-27 SURGICAL SUPPLY — 44 items
BAG COUNTER SPONGE SURGICOUNT (BAG) IMPLANT
BAG DRN RND TRDRP ANRFLXCHMBR (UROLOGICAL SUPPLIES) ×3
BAG SPNG CNTER NS LX DISP (BAG)
BAG SURGICOUNT SPONGE COUNTING (BAG)
BAG URINE DRAIN 2000ML AR STRL (UROLOGICAL SUPPLIES) ×6 IMPLANT
BAG URO CATCHER STRL LF (MISCELLANEOUS) ×6 IMPLANT
CATH HEMA 3WAY 30CC 22FR COUDE (CATHETERS) ×2 IMPLANT
COVER SURGICAL LIGHT HANDLE (MISCELLANEOUS) ×4 IMPLANT
DRAPE FOOT SWITCH (DRAPES) ×6 IMPLANT
DRSG TEGADERM 4X4.75 (GAUZE/BANDAGES/DRESSINGS) ×8 IMPLANT
DRSG TEGADERM 8X12 (GAUZE/BANDAGES/DRESSINGS) ×10 IMPLANT
DRSG TELFA 3X8 NADH (GAUZE/BANDAGES/DRESSINGS) IMPLANT
GLOVE SURG ENC TEXT LTX SZ7.5 (GLOVE) ×8 IMPLANT
GLOVE SURG NEOPR MICRO LF SZ8 (GLOVE) ×8 IMPLANT
GOWN STRL REUS W/TWL LRG LVL3 (GOWN DISPOSABLE) ×8 IMPLANT
HOLDER FOLEY CATH W/STRAP (MISCELLANEOUS) ×2 IMPLANT
IMPL SPACEOAR VUE SYSTEM (Spacer) ×4 IMPLANT
IMPLANT SPACEOAR VUE SYSTEM (Spacer) ×5 IMPLANT
INST BIOPSY MAXCORE 18GX25 (NEEDLE) IMPLANT
INSTR BIOPSY MAXCORE 18GX20 (NEEDLE) IMPLANT
IV NS IRRIG 3000ML ARTHROMATIC (IV SOLUTION) ×12 IMPLANT
LOOP CUT BIPOLAR 24F LRG (ELECTROSURGICAL) ×2 IMPLANT
MANIFOLD NEPTUNE II (INSTRUMENTS) ×6 IMPLANT
MARKER GOLD PRELOAD 1.2X3 (Urological Implant) ×4 IMPLANT
NDL SAFETY ECLIPSE 18X1.5 (NEEDLE) IMPLANT
NDL SPNL 22GX7 QUINCKE BK (NEEDLE) ×4 IMPLANT
NEEDLE HYPO 18GX1.5 SHARP (NEEDLE)
NEEDLE SPNL 22GX7 QUINCKE BK (NEEDLE) IMPLANT
PACK CYSTO (CUSTOM PROCEDURE TRAY) ×6 IMPLANT
PAD DRESSING TELFA 3X8 NADH (GAUZE/BANDAGES/DRESSINGS) ×4 IMPLANT
PENCIL SMOKE EVACUATOR (MISCELLANEOUS) IMPLANT
SEED GOLD PRELOAD 1.2X3 (Urological Implant) ×5 IMPLANT
SURGILUBE 2OZ TUBE FLIPTOP (MISCELLANEOUS) ×4 IMPLANT
SYR 20ML LL LF (SYRINGE) IMPLANT
SYR 30ML LL (SYRINGE) ×6 IMPLANT
SYR CONTROL 10ML LL (SYRINGE) ×2 IMPLANT
SYR TOOMEY IRRIG 70ML (MISCELLANEOUS) ×5
SYRINGE TOOMEY IRRIG 70ML (MISCELLANEOUS) ×4 IMPLANT
TOWEL OR 17X26 10 PK STRL BLUE (TOWEL DISPOSABLE) ×6 IMPLANT
TUBING CONNECTING 10 (TUBING) ×5 IMPLANT
TUBING CONNECTING 10' (TUBING) ×1
TUBING UROLOGY SET (TUBING) ×6 IMPLANT
UNDERPAD 30X36 HEAVY ABSORB (UNDERPADS AND DIAPERS) ×8 IMPLANT
WATER STERILE IRR 500ML POUR (IV SOLUTION) ×6 IMPLANT

## 2021-11-27 NOTE — Op Note (Signed)
Preoperative diagnosis: Bladder outlet obstruction secondary to BPH, Prostate cancer  Postoperative diagnosis:  Bladder outlet obstruction secondary to BPH, Prostate cancer  Procedure:  Cystoscopy Transurethral resection of the prostate Placement of fiducial markers into prostate SpaceOAR hydrogel insertion  Surgeon: Pryor Curia. M.D.  Anesthesia: General  Complications: None  EBL: Minimal  Specimens: Prostate chips  Disposition of specimens: Pathology  Indication: Thomas [name] is a patient with bladder outlet obstruction secondary to benign prostatic hyperplasia and high risk prostate cancer. After reviewing the management options for treatment, he elected to proceed with the above surgical procedure(s). We have discussed the potential benefits and risks of the procedure, side effects of the proposed treatment, the likelihood of the patient achieving the goals of the procedure, and any potential problems that might occur during the procedure or recuperation. Informed consent has been obtained.  Description of procedure:  The patient was taken to the operating room and general anesthesia was induced.  The patient was placed in the dorsal lithotomy position, prepped and draped in the usual sterile fashion, and preoperative antibiotics were administered. A preoperative time-out was performed.   Next, transrectal ultrasonography was utilized to visualize the prostate. Three gold fiducial markers were then placed into the prostate via transperineal needles under ultrasound guidance at the left apex, left base, and right mid gland under direct ultrasound guidance.  A site in the midline was then selected on the perineum for placement of an 18 g needle with saline.  The needle was advanced above the rectum and below Denonvillier's fascia to the mid gland and confirmed to be in the midline on transverse imaging.  One cc of saline was injected confirming appropriate expansion of  this space.  A total of 5 cc of saline was then injected to open the space further bilaterally.  The saline syringe was then removed and the SpaceOAR hydrogel was injected with good distribution bilaterally.  The patient was then prepped and draped again in preparation for the TURP.  Cystourethroscopy was performed.  The patients urethra was examined and demonstrated bilobar prostatic hypertrophy.   The bladder was then systematically examined in its entirety. There was no evidence of any bladder tumors, stones, or other mucosal pathology.  The ureteral orifices were identified and marked so as to be avoided during the procedure.  The prostate adenoma was then resected utilizing loop cautery resection with the monopolar/bipolar cutting loop.  The prostate adenoma from the bladder neck back to the verumontanum was resected beginning at the six o'clock position and then extended to include the right and left lobes of the prostate and anterior prostate. Care was taken not to resect distal to the verumontanum.  Hemostasis was then achieved with the cautery and the bladder was emptied and reinspected with no significant bleeding noted at the end of the procedure.    A 3 way catheter was then placed into the bladder and placed on continuous bladder irrigation.  The patient appeared to tolerate the procedure well and without complications.  The patient was able to be awakened and transferred to the recovery unit in satisfactory condition.

## 2021-11-27 NOTE — Discharge Instructions (Addendum)
You may see some blood in the urine and may have some burning with urination for 48-72 hours. You also may notice that you have to urinate more frequently or urgently after your procedure which is normal.  You should call should you develop an inability urinate, fever > 101, persistent nausea and vomiting that prevents you from eating or drinking to stay hydrated.  Avoid lifting > 10 lbs for about 2 weeks.  Otherwise, you may resume most other normal activities as tolerated. Ok to resume aspirin and other supplements/vitamins in one week if urine is clear.

## 2021-11-27 NOTE — Anesthesia Procedure Notes (Signed)
Procedure Name: LMA Insertion Date/Time: 11/27/2021 10:36 AM Performed by: Eben Burow, CRNA Pre-anesthesia Checklist: Patient identified, Emergency Drugs available, Suction available, Patient being monitored and Timeout performed Patient Re-evaluated:Patient Re-evaluated prior to induction Oxygen Delivery Method: Circle system utilized Preoxygenation: Pre-oxygenation with 100% oxygen Induction Type: IV induction Ventilation: Mask ventilation without difficulty LMA: LMA inserted and LMA with gastric port inserted Laryngoscope Size: 4 Number of attempts: 2 Tube secured with: Tape Dental Injury: Teeth and Oropharynx as per pre-operative assessment  Comments: LMA #4 with gastric port inserted with poor seal, replaced with regular #4 LMA, poor seal, will intubate

## 2021-11-27 NOTE — Anesthesia Postprocedure Evaluation (Signed)
Anesthesia Post Note  Patient: Thomas [name]  Procedure(s) Performed: TRANSURETHRAL RESECTION OF THE PROSTATE (TURP) WITH CYSTOSCOPY/ (Prostate) GOLD SEED IMPLANT (Prostate) SPACE OAR INSTILLATION TRANSRECTAL ULTRASOUND (Rectum)     Patient location during evaluation: PACU Anesthesia Type: General Level of consciousness: awake and alert Pain management: pain level controlled Vital Signs Assessment: post-procedure vital signs reviewed and stable Respiratory status: spontaneous breathing, nonlabored ventilation, respiratory function stable and patient connected to nasal cannula oxygen Cardiovascular status: blood pressure returned to baseline and stable Postop Assessment: no apparent nausea or vomiting Anesthetic complications: no   No notable events documented.  Last Vitals:  Vitals:   11/27/21 0828 11/27/21 1207  BP: 119/77 123/69  Pulse: 65 72  Resp: 18 14  Temp: 37.1 C   SpO2: 100% 100%    Last Pain:  Vitals:   11/27/21 1207  TempSrc:   PainSc: Asleep                 Zandon Talton S

## 2021-11-27 NOTE — Anesthesia Preprocedure Evaluation (Signed)
Anesthesia Evaluation  Patient identified by MRN, date of birth, ID band Patient awake    Reviewed: Allergy & Precautions, NPO status , Patient's Chart, lab work & pertinent test results  Airway Mallampati: II  TM Distance: >3 FB Neck ROM: Full    Dental no notable dental hx.    Pulmonary neg pulmonary ROS,    Pulmonary exam normal breath sounds clear to auscultation       Cardiovascular Normal cardiovascular exam Rhythm:Regular Rate:Normal  /P minimally-invasive mitral valve repair 01/24/2021   Left Ventricle: Left ventricular ejection fraction, by estimation, is 50  to 55%. Left ventricular ejection fraction by 3D volume is 53 %. The left  ventricle has low normal function. The left ventricle has no regional wall  motion abnormalities. The left  ventricular internal cavity size was normal in size. There is no left  ventricular hypertrophy. Abnormal (paradoxical) septal motion consistent  with post-operative status. Left ventricular diastolic function could not  be evaluated due to mitral valve  repair. Left ventricular diastolic function could not be evaluated.   Right Ventricle: The right ventricular size is normal. No increase in  right ventricular wall thickness. Right ventricular systolic function is  normal. There is normal pulmonary artery systolic pressure. The tricuspid  regurgitant velocity is 2.16 m/s, and  with an assumed right atrial pressure of 3 mmHg, the estimated right  ventricular systolic pressure is 32.5 mmHg.   Left Atrium: Left atrial size was moderately dilated.   Right Atrium: Right atrial size was normal in size.   Pericardium: There is no evidence of pericardial effusion.   Mitral Valve: The mitral valve is myxomatous. No evidence of mitral valve  regurgitation. There is a 32 mm Memo prosthetic annuloplasty ring present  in the mitral position. Procedure Date: 01/24/2021. No evidence of mitral   valve stenosis. MV peak  gradient, 7.5 mmHg. The mean mitral valve gradient is 3.5 mmHg with  average heart rate of 75 bpm.    Neuro/Psych negative neurological ROS  negative psych ROS   GI/Hepatic negative GI ROS, Neg liver ROS,   Endo/Other  negative endocrine ROS  Renal/GU negative Renal ROS  negative genitourinary   Musculoskeletal negative musculoskeletal ROS (+)   Abdominal   Peds negative pediatric ROS (+)  Hematology negative hematology ROS (+)   Anesthesia Other Findings   Reproductive/Obstetrics negative OB ROS                             Anesthesia Physical Anesthesia Plan  ASA: 3  Anesthesia Plan: General   Post-op Pain Management:    Induction: Intravenous  PONV Risk Score and Plan: 2 and Ondansetron, Dexamethasone and Treatment may vary due to age or medical condition  Airway Management Planned: LMA  Additional Equipment:   Intra-op Plan:   Post-operative Plan: Extubation in OR  Informed Consent: I have reviewed the patients History and Physical, chart, labs and discussed the procedure including the risks, benefits and alternatives for the proposed anesthesia with the patient or authorized representative who has indicated his/her understanding and acceptance.     Dental advisory given  Plan Discussed with: CRNA and Surgeon  Anesthesia Plan Comments:         Anesthesia Quick Evaluation

## 2021-11-27 NOTE — Anesthesia Procedure Notes (Signed)
Procedure Name: Intubation Date/Time: 11/27/2021 10:37 AM Performed by: Eben Burow, CRNA Pre-anesthesia Checklist: Patient identified, Emergency Drugs available, Suction available, Patient being monitored and Timeout performed Patient Re-evaluated:Patient Re-evaluated prior to induction Oxygen Delivery Method: Circle system utilized Preoxygenation: Pre-oxygenation with 100% oxygen Induction Type: IV induction Ventilation: Mask ventilation without difficulty Laryngoscope Size: Mac and 4 Grade View: Grade II Tube type: Oral Tube size: 7.5 mm Number of attempts: 1 Airway Equipment and Method: Stylet Placement Confirmation: ETT inserted through vocal cords under direct vision, positive ETCO2 and breath sounds checked- equal and bilateral Secured at: 22 cm Tube secured with: Tape Dental Injury: Teeth and Oropharynx as per pre-operative assessment

## 2021-11-27 NOTE — Progress Notes (Signed)
Patient ID: Thomas [name], male   DOB: 1946/08/08, 76 y.o.   MRN: [id number]  Doing well.  Urine clear on minimal CBI.  Titrate CBI to off overnight.

## 2021-11-27 NOTE — Transfer of Care (Signed)
Immediate Anesthesia Transfer of Care Note  Patient: Thomas [name]  Procedure(s) Performed: TRANSURETHRAL RESECTION OF THE PROSTATE (TURP) WITH CYSTOSCOPY/ (Prostate) GOLD SEED IMPLANT (Prostate) SPACE OAR INSTILLATION TRANSRECTAL ULTRASOUND (Rectum)  Patient Location: PACU  Anesthesia Type:General  Level of Consciousness: awake, alert  and patient cooperative  Airway & Oxygen Therapy: Patient Spontanous Breathing and Patient connected to face mask oxygen  Post-op Assessment: Report given to RN and Post -op Vital signs reviewed and stable  Post vital signs: Reviewed and stable  Last Vitals:  Vitals Value Taken Time  BP    Temp    Pulse 71 11/27/21 1207  Resp 18 11/27/21 1207  SpO2 100 % 11/27/21 1207  Vitals shown include unvalidated device data.  Last Pain:  Vitals:   11/27/21 0828  TempSrc: Oral         Complications: No notable events documented.

## 2021-11-27 NOTE — Telephone Encounter (Signed)
CALLED PATIENT TO GIVE NEW APPT. DATE AND TIME, LVM FOR A RETURN CALL

## 2021-11-27 NOTE — Plan of Care (Signed)
  Problem: Education: Goal: Knowledge of General Education information will improve Description: Including pain rating scale, medication(s)/side effects and non-pharmacologic comfort measures Outcome: Progressing   Problem: Activity: Goal: Risk for activity intolerance will decrease Outcome: Progressing   Problem: Pain Managment: Goal: General experience of comfort will improve Outcome: Progressing   

## 2021-11-27 NOTE — Progress Notes (Signed)
°  Radiation Oncology         [phone] ________________________________  Name: Thomas [name] MRN: [id number]  Date: 10/13/2021  DOB: [birth year]  Chart Note:  I assisted the Urologist using the radiation oncology transrectal ultrasound stepper in the OR.  The procedure went well with good placement and positioning of three fiducial markers and SpaceOAR gel.  Plan:  At this point, the patient is set up to proceed with IMRT simulation on 01/15/22 following recovery from TURP and follow-up with [name] on 01/09/22.  ________________________________  Sheral Apley. Tammi Klippel, M.D.

## 2021-11-27 NOTE — Interval H&P Note (Signed)
History and Physical Interval Note:  11/27/2021 9:21 AM  Thomas [name]  has presented today for surgery, with the diagnosis of PROSTATE CANCER, BLADDER OUTLET OBSTRUCTION.  The various methods of treatment have been discussed with the patient and family. After consideration of risks, benefits and other options for treatment, the patient has consented to  Procedure(s): TRANSURETHRAL RESECTION OF THE PROSTATE (TURP) WITH CYSTOSCOPY/ (N/A) GOLD SEED IMPLANT (N/A) SPACE OAR INSTILLATION (N/A) TRANSRECTAL ULTRASOUND (N/A) as a surgical intervention.  The patient's history has been reviewed, patient examined, no change in status, stable for surgery.  I have reviewed the patient's chart and labs.  Questions were answered to the patient's satisfaction.     [employer]

## 2021-11-28 ENCOUNTER — Encounter (HOSPITAL_COMMUNITY): Payer: Self-pay | Admitting: Urology

## 2021-11-28 ENCOUNTER — Telehealth: Payer: Self-pay | Admitting: *Deleted

## 2021-11-28 DIAGNOSIS — C61 Malignant neoplasm of prostate: Secondary | ICD-10-CM | POA: Diagnosis not present

## 2021-11-28 LAB — SURGICAL PATHOLOGY

## 2021-11-28 NOTE — Progress Notes (Signed)
Pt has now voided 400 cc of pink tinged urine.

## 2021-11-28 NOTE — Discharge Summary (Signed)
°  Date of admission: 11/27/2021  Date of discharge: 11/28/2021  Admission diagnosis: Prostate cancer, bladder outlet obstruction  Discharge diagnosis: Prostate cancer, bladder outlet obstruction  History and Physical: For full details, please see admission history and physical. Briefly, Thomas [name] is a 76 y.o. year old patient with high risk prostate cancer and bladder outlet obstruction due to BPH.  He began androgen deprivation and presents now to undergo TURP to relieve his bladder outlet obstruction and to prepare for definitive radiotherapy for treatment of prostate cancer.  Hospital Course: He underwent placement of fiducial markers, SpaceOAR hydrogel insertion and TURP on 11/27/21.  He tolerated this procedure well and was maintained on CBI overnight.  His urine was clear off CBI by the morning of POD#1 and his catheter was removed.  He underwent a successful voiding trial and was discharged home on POD # 1.   Disposition: Home  Discharge instruction: The patient was instructed to be ambulatory but told to refrain from heavy lifting, strenuous activity, or driving.   Discharge medications:  Allergies as of 11/28/2021   No Known Allergies      Medication List     STOP taking these medications    aspirin EC 81 MG tablet   doxycycline 100 MG tablet Commonly known as: VIBRA-TABS   HYDROcodone-acetaminophen 5-325 MG tablet Commonly known as: NORCO/VICODIN   multivitamin with minerals tablet   ondansetron 4 MG tablet Commonly known as: Zofran       TAKE these medications    acetaminophen 500 MG tablet Commonly known as: TYLENOL Take 1,000 mg by mouth every 6 (six) hours as needed (pain.).   Calcium 600/Vitamin D3 600-20 MG-MCG Tabs Generic drug: Calcium Carb-Cholecalciferol Take 1 tablet by mouth in the morning and at bedtime.   docusate sodium 100 MG capsule Commonly known as: COLACE Take 1 capsule (100 mg total) by mouth 2 (two) times daily.   Magnesium  Oxide 250 MG Tabs Take 250 mg by mouth at bedtime.   metoprolol tartrate 25 MG tablet Commonly known as: LOPRESSOR Take 0.5 tablets (12.5 mg total) by mouth 2 (two) times daily.   tamsulosin 0.4 MG Caps capsule Commonly known as: FLOMAX Take 0.4 mg by mouth at bedtime.   traMADol 50 MG tablet Commonly known as: ULTRAM Take 1-2 tablets (50-100 mg total) by mouth every 6 (six) hours as needed (pain).        Followup:   Follow-up Information     Raynelle Bring, MD Follow up.   Specialty: Urology Why: 01/09/22 at 9:15 AM Contact information: [place] [address] [phone]

## 2021-11-28 NOTE — Progress Notes (Signed)
Pt to be discharged to home today. Pt given discharge instructions including Discharge Medications and schedules for these Medications. Pt verbalized understanding of all discharge teaching. Discharge AVS with the Pt at time of discharge. VSS and no acute changes noted at discharge

## 2021-11-28 NOTE — Progress Notes (Signed)
Patient ID: Thomas [name], male   DOB: 05/23/1946, 76 y.o.   MRN: [id number]  1 Day Post-Op Subjective: Pt doing well.  Urine clear off CBI.  Objective: Vital signs in last 24 hours: Temp:  [97.5 F (36.4 C)-98.7 F (37.1 C)] 98.2 F (36.8 C) (01/24 0527) Pulse Rate:  [61-78] 61 (01/24 0527) Resp:  [9-18] 16 (01/24 0527) BP: (100-162)/(68-96) 115/72 (01/24 0527) SpO2:  [94 %-100 %] 97 % (01/24 0527) Weight:  [82.2 kg] 82.2 kg (01/23 0828)  Intake/Output from previous day: 01/23 0701 - 01/24 0700 In: 8038.5 [P.O.:240; I.V.:2598.5; IV Piggyback:100] Out: 6250 [Urine:6250] Intake/Output this shift: No intake/output data recorded.  Physical Exam:  General: Alert and oriented GU: Urine clear.  Procedure: Foley removed  Assessment/Plan: POD # 1 s/p TURP - D/C Foley and voiding trial   LOS: 0 days   Dutch Gray 11/28/2021, 7:40 AM

## 2021-11-28 NOTE — Progress Notes (Signed)
[name] updated via phone regarding Pt's voiding status post foley removal. Pt to be discharged today

## 2021-11-28 NOTE — Progress Notes (Signed)
Pt has now voided 250 cc Urine. Color is now lighter red in color and no clots noted.

## 2021-11-28 NOTE — Progress Notes (Signed)
Pt has voided 50 cc bloody urine in urinal. Small clots noted.

## 2021-11-28 NOTE — Telephone Encounter (Signed)
CALLED PATIENT TO INFORM THAT SIM APPT. HAS BEEN MOVED TO 01-15-22- ARRIVAL TIME- 12:45 PM @ [place], SPOKE WITH Thomas [name] AND HE IS AWARE OF THIS APPT.

## 2021-11-29 ENCOUNTER — Ambulatory Visit: Payer: PPO | Admitting: Radiation Oncology

## 2021-12-08 DIAGNOSIS — M25562 Pain in left knee: Secondary | ICD-10-CM | POA: Diagnosis not present

## 2021-12-13 DIAGNOSIS — M25562 Pain in left knee: Secondary | ICD-10-CM | POA: Diagnosis not present

## 2021-12-15 DIAGNOSIS — R8279 Other abnormal findings on microbiological examination of urine: Secondary | ICD-10-CM | POA: Diagnosis not present

## 2021-12-15 DIAGNOSIS — R350 Frequency of micturition: Secondary | ICD-10-CM | POA: Diagnosis not present

## 2021-12-15 DIAGNOSIS — M25562 Pain in left knee: Secondary | ICD-10-CM | POA: Diagnosis not present

## 2021-12-18 DIAGNOSIS — M25562 Pain in left knee: Secondary | ICD-10-CM | POA: Diagnosis not present

## 2021-12-20 DIAGNOSIS — M25562 Pain in left knee: Secondary | ICD-10-CM | POA: Diagnosis not present

## 2021-12-25 DIAGNOSIS — M25562 Pain in left knee: Secondary | ICD-10-CM | POA: Diagnosis not present

## 2021-12-27 DIAGNOSIS — M25562 Pain in left knee: Secondary | ICD-10-CM | POA: Diagnosis not present

## 2021-12-28 DIAGNOSIS — M25562 Pain in left knee: Secondary | ICD-10-CM | POA: Diagnosis not present

## 2022-01-01 ENCOUNTER — Ambulatory Visit: Payer: PPO | Admitting: Radiation Oncology

## 2022-01-01 DIAGNOSIS — E785 Hyperlipidemia, unspecified: Secondary | ICD-10-CM | POA: Diagnosis not present

## 2022-01-01 DIAGNOSIS — I341 Nonrheumatic mitral (valve) prolapse: Secondary | ICD-10-CM | POA: Diagnosis not present

## 2022-01-01 DIAGNOSIS — C61 Malignant neoplasm of prostate: Secondary | ICD-10-CM | POA: Diagnosis not present

## 2022-01-09 DIAGNOSIS — R3914 Feeling of incomplete bladder emptying: Secondary | ICD-10-CM | POA: Diagnosis not present

## 2022-01-11 ENCOUNTER — Telehealth: Payer: Self-pay | Admitting: *Deleted

## 2022-01-11 NOTE — Telephone Encounter (Signed)
CALLED PATIENT TO REMIND OF SIM APPT. ON 01-15-22- ARRIVAL TIME- 12:45 PM @ [place], PATIENT INFORMED TO ARRIVE WITH A FULL BLADDER AND AN EMPTY BOWEL, SPOKE WITH PATIENT AND HE VERIFIED UNDERSTANDING THIS APPT. ?

## 2022-01-14 NOTE — Progress Notes (Signed)
?  Radiation Oncology         [phone] ?________________________________ ? ?Name: Thomas [name] MRN: [id number]  ?Date: 01/15/2022  DOB: 28-Dec-1945 ? ?SIMULATION AND TREATMENT PLANNING NOTE ? ?  ICD-10-CM   ?1. Malignant neoplasm of prostate ([place])  C61   ?  ? ? ?DIAGNOSIS:  76 y.o. gentleman with stage T1c adenocarcinoma of the prostate with a Gleason's score of 3+5 and a PSA of 9.48 ? ?NARRATIVE:  The patient was brought to the [facility].  Identity was confirmed.  All relevant records and images related to the planned course of therapy were reviewed.  The patient freely provided informed written consent to proceed with treatment after reviewing the details related to the planned course of therapy. The consent form was witnessed and verified by the simulation staff.  Then, the patient was set-up in a stable reproducible supine position for radiation therapy.  A vacuum lock pillow device was custom fabricated to position his legs in a reproducible immobilized position.  Then, I performed a urethrogram under sterile conditions to identify the prostatic bed.  CT images were obtained.  Surface markings were placed.  The CT images were loaded into the planning software.  Then the prostate bed target, pelvic lymph node target and avoidance structures including the rectum, bladder, bowel and hips were contoured.  Treatment planning then occurred.  The radiation prescription was entered and confirmed.  A total of one complex treatment devices were fabricated. I have requested : Intensity Modulated Radiotherapy (IMRT) is medically necessary for this case for the following reason:  Rectal sparing.. ? ?PLAN:  The patient will receive 45 Gy in 25 fractions of 1.8 Gy, followed by a boost to the prostate to a total dose of 75 Gy with 15 additional fractions of 2 Gy. ? ? ?________________________________ ? ?Sheral Apley Tammi Klippel, M.D. ? ?

## 2022-01-15 ENCOUNTER — Ambulatory Visit
Admission: RE | Admit: 2022-01-15 | Discharge: 2022-01-15 | Disposition: A | Discharge status: Home / Self Care | Payer: PPO | Source: Ambulatory Visit | Attending: Radiation Oncology | Admitting: Radiation Oncology

## 2022-01-15 ENCOUNTER — Other Ambulatory Visit: Payer: Self-pay

## 2022-01-15 DIAGNOSIS — C61 Malignant neoplasm of prostate: Secondary | ICD-10-CM | POA: Insufficient documentation

## 2022-01-15 DIAGNOSIS — Z191 Hormone sensitive malignancy status: Secondary | ICD-10-CM | POA: Diagnosis not present

## 2022-01-15 NOTE — Progress Notes (Signed)
RN meet with patient prior to his CT Sim.  No barriers to identify at this time.   Patient knows to call with any questions or concerns that may arise.  ?

## 2022-01-16 DIAGNOSIS — M25571 Pain in right ankle and joints of right foot: Secondary | ICD-10-CM | POA: Diagnosis not present

## 2022-01-16 DIAGNOSIS — M2141 Flat foot [pes planus] (acquired), right foot: Secondary | ICD-10-CM | POA: Diagnosis not present

## 2022-01-16 DIAGNOSIS — M2142 Flat foot [pes planus] (acquired), left foot: Secondary | ICD-10-CM | POA: Diagnosis not present

## 2022-01-17 DIAGNOSIS — C61 Malignant neoplasm of prostate: Secondary | ICD-10-CM | POA: Diagnosis not present

## 2022-01-17 DIAGNOSIS — Z191 Hormone sensitive malignancy status: Secondary | ICD-10-CM | POA: Diagnosis not present

## 2022-01-22 ENCOUNTER — Other Ambulatory Visit: Payer: Self-pay | Admitting: Cardiology

## 2022-01-24 ENCOUNTER — Other Ambulatory Visit: Payer: Self-pay

## 2022-01-24 ENCOUNTER — Ambulatory Visit
Admission: RE | Admit: 2022-01-24 | Discharge: 2022-01-24 | Disposition: A | Discharge status: Home / Self Care | Payer: PPO | Source: Ambulatory Visit | Attending: Radiation Oncology | Admitting: Radiation Oncology

## 2022-01-24 DIAGNOSIS — C61 Malignant neoplasm of prostate: Secondary | ICD-10-CM | POA: Diagnosis not present

## 2022-01-24 DIAGNOSIS — Z510 Encounter for antineoplastic radiation therapy: Secondary | ICD-10-CM | POA: Diagnosis not present

## 2022-01-24 DIAGNOSIS — Z191 Hormone sensitive malignancy status: Secondary | ICD-10-CM | POA: Diagnosis not present

## 2022-01-25 ENCOUNTER — Other Ambulatory Visit: Payer: Self-pay

## 2022-01-25 ENCOUNTER — Ambulatory Visit
Admission: RE | Admit: 2022-01-25 | Discharge: 2022-01-25 | Disposition: A | Discharge status: Home / Self Care | Payer: PPO | Source: Ambulatory Visit | Attending: Radiation Oncology | Admitting: Radiation Oncology

## 2022-01-25 DIAGNOSIS — Z191 Hormone sensitive malignancy status: Secondary | ICD-10-CM | POA: Diagnosis not present

## 2022-01-25 DIAGNOSIS — C61 Malignant neoplasm of prostate: Secondary | ICD-10-CM | POA: Diagnosis not present

## 2022-01-25 DIAGNOSIS — Z510 Encounter for antineoplastic radiation therapy: Secondary | ICD-10-CM | POA: Diagnosis not present

## 2022-01-26 ENCOUNTER — Ambulatory Visit
Admission: RE | Admit: 2022-01-26 | Discharge: 2022-01-26 | Disposition: A | Discharge status: Home / Self Care | Payer: PPO | Source: Ambulatory Visit | Attending: Radiation Oncology | Admitting: Radiation Oncology

## 2022-01-26 DIAGNOSIS — Z191 Hormone sensitive malignancy status: Secondary | ICD-10-CM | POA: Diagnosis not present

## 2022-01-26 DIAGNOSIS — Z510 Encounter for antineoplastic radiation therapy: Secondary | ICD-10-CM | POA: Diagnosis not present

## 2022-01-26 DIAGNOSIS — C61 Malignant neoplasm of prostate: Secondary | ICD-10-CM | POA: Diagnosis not present

## 2022-01-29 ENCOUNTER — Ambulatory Visit: Payer: PPO

## 2022-01-30 ENCOUNTER — Ambulatory Visit
Admission: RE | Admit: 2022-01-30 | Discharge: 2022-01-30 | Disposition: A | Discharge status: Home / Self Care | Payer: PPO | Source: Ambulatory Visit | Attending: Radiation Oncology | Admitting: Radiation Oncology

## 2022-01-30 ENCOUNTER — Other Ambulatory Visit: Payer: Self-pay

## 2022-01-30 DIAGNOSIS — C61 Malignant neoplasm of prostate: Secondary | ICD-10-CM | POA: Diagnosis not present

## 2022-01-30 DIAGNOSIS — Z510 Encounter for antineoplastic radiation therapy: Secondary | ICD-10-CM | POA: Diagnosis not present

## 2022-01-30 DIAGNOSIS — Z191 Hormone sensitive malignancy status: Secondary | ICD-10-CM | POA: Diagnosis not present

## 2022-01-31 ENCOUNTER — Ambulatory Visit
Admission: RE | Admit: 2022-01-31 | Discharge: 2022-01-31 | Disposition: A | Discharge status: Home / Self Care | Payer: PPO | Source: Ambulatory Visit | Attending: Radiation Oncology | Admitting: Radiation Oncology

## 2022-01-31 DIAGNOSIS — Z191 Hormone sensitive malignancy status: Secondary | ICD-10-CM | POA: Diagnosis not present

## 2022-01-31 DIAGNOSIS — C61 Malignant neoplasm of prostate: Secondary | ICD-10-CM | POA: Diagnosis not present

## 2022-01-31 DIAGNOSIS — Z510 Encounter for antineoplastic radiation therapy: Secondary | ICD-10-CM | POA: Diagnosis not present

## 2022-02-01 ENCOUNTER — Other Ambulatory Visit: Payer: Self-pay

## 2022-02-01 ENCOUNTER — Ambulatory Visit
Admission: RE | Admit: 2022-02-01 | Discharge: 2022-02-01 | Disposition: A | Discharge status: Home / Self Care | Payer: PPO | Source: Ambulatory Visit | Attending: Radiation Oncology | Admitting: Radiation Oncology

## 2022-02-01 DIAGNOSIS — Z191 Hormone sensitive malignancy status: Secondary | ICD-10-CM | POA: Diagnosis not present

## 2022-02-01 DIAGNOSIS — Z510 Encounter for antineoplastic radiation therapy: Secondary | ICD-10-CM | POA: Diagnosis not present

## 2022-02-01 DIAGNOSIS — C61 Malignant neoplasm of prostate: Secondary | ICD-10-CM | POA: Diagnosis not present

## 2022-02-02 ENCOUNTER — Ambulatory Visit
Admission: RE | Admit: 2022-02-02 | Discharge: 2022-02-02 | Disposition: A | Discharge status: Home / Self Care | Payer: PPO | Source: Ambulatory Visit | Attending: Radiation Oncology | Admitting: Radiation Oncology

## 2022-02-02 DIAGNOSIS — M25571 Pain in right ankle and joints of right foot: Secondary | ICD-10-CM | POA: Diagnosis not present

## 2022-02-02 DIAGNOSIS — Z510 Encounter for antineoplastic radiation therapy: Secondary | ICD-10-CM | POA: Diagnosis not present

## 2022-02-02 DIAGNOSIS — C61 Malignant neoplasm of prostate: Secondary | ICD-10-CM | POA: Diagnosis not present

## 2022-02-02 DIAGNOSIS — Z191 Hormone sensitive malignancy status: Secondary | ICD-10-CM | POA: Diagnosis not present

## 2022-02-02 DIAGNOSIS — M25572 Pain in left ankle and joints of left foot: Secondary | ICD-10-CM | POA: Diagnosis not present

## 2022-02-05 ENCOUNTER — Other Ambulatory Visit: Payer: Self-pay

## 2022-02-05 ENCOUNTER — Ambulatory Visit
Admission: RE | Admit: 2022-02-05 | Discharge: 2022-02-05 | Disposition: A | Discharge status: Home / Self Care | Payer: PPO | Source: Ambulatory Visit | Attending: Radiation Oncology | Admitting: Radiation Oncology

## 2022-02-05 DIAGNOSIS — Z191 Hormone sensitive malignancy status: Secondary | ICD-10-CM | POA: Diagnosis not present

## 2022-02-05 DIAGNOSIS — Z510 Encounter for antineoplastic radiation therapy: Secondary | ICD-10-CM | POA: Diagnosis not present

## 2022-02-05 DIAGNOSIS — C61 Malignant neoplasm of prostate: Secondary | ICD-10-CM | POA: Diagnosis not present

## 2022-02-06 ENCOUNTER — Ambulatory Visit
Admission: RE | Admit: 2022-02-06 | Discharge: 2022-02-06 | Disposition: A | Discharge status: Home / Self Care | Payer: PPO | Source: Ambulatory Visit | Attending: Radiation Oncology | Admitting: Radiation Oncology

## 2022-02-06 DIAGNOSIS — Z510 Encounter for antineoplastic radiation therapy: Secondary | ICD-10-CM | POA: Diagnosis not present

## 2022-02-06 DIAGNOSIS — Z191 Hormone sensitive malignancy status: Secondary | ICD-10-CM | POA: Diagnosis not present

## 2022-02-06 DIAGNOSIS — C61 Malignant neoplasm of prostate: Secondary | ICD-10-CM | POA: Diagnosis not present

## 2022-02-07 ENCOUNTER — Ambulatory Visit
Admission: RE | Admit: 2022-02-07 | Discharge: 2022-02-07 | Disposition: A | Discharge status: Home / Self Care | Payer: PPO | Source: Ambulatory Visit | Attending: Radiation Oncology | Admitting: Radiation Oncology

## 2022-02-07 ENCOUNTER — Other Ambulatory Visit: Payer: Self-pay

## 2022-02-07 DIAGNOSIS — Z191 Hormone sensitive malignancy status: Secondary | ICD-10-CM | POA: Diagnosis not present

## 2022-02-07 DIAGNOSIS — Z510 Encounter for antineoplastic radiation therapy: Secondary | ICD-10-CM | POA: Diagnosis not present

## 2022-02-07 DIAGNOSIS — C61 Malignant neoplasm of prostate: Secondary | ICD-10-CM | POA: Diagnosis not present

## 2022-02-08 ENCOUNTER — Ambulatory Visit
Admission: RE | Admit: 2022-02-08 | Discharge: 2022-02-08 | Disposition: A | Discharge status: Home / Self Care | Payer: PPO | Source: Ambulatory Visit | Attending: Radiation Oncology | Admitting: Radiation Oncology

## 2022-02-08 DIAGNOSIS — Z191 Hormone sensitive malignancy status: Secondary | ICD-10-CM | POA: Diagnosis not present

## 2022-02-08 DIAGNOSIS — Z510 Encounter for antineoplastic radiation therapy: Secondary | ICD-10-CM | POA: Diagnosis not present

## 2022-02-08 DIAGNOSIS — C61 Malignant neoplasm of prostate: Secondary | ICD-10-CM | POA: Diagnosis not present

## 2022-02-09 ENCOUNTER — Other Ambulatory Visit: Payer: Self-pay

## 2022-02-09 ENCOUNTER — Ambulatory Visit
Admission: RE | Admit: 2022-02-09 | Discharge: 2022-02-09 | Disposition: A | Discharge status: Home / Self Care | Payer: PPO | Source: Ambulatory Visit | Attending: Radiation Oncology | Admitting: Radiation Oncology

## 2022-02-09 DIAGNOSIS — C61 Malignant neoplasm of prostate: Secondary | ICD-10-CM | POA: Diagnosis not present

## 2022-02-09 DIAGNOSIS — Z191 Hormone sensitive malignancy status: Secondary | ICD-10-CM | POA: Diagnosis not present

## 2022-02-09 DIAGNOSIS — Z510 Encounter for antineoplastic radiation therapy: Secondary | ICD-10-CM | POA: Diagnosis not present

## 2022-02-12 ENCOUNTER — Other Ambulatory Visit: Payer: Self-pay

## 2022-02-12 ENCOUNTER — Ambulatory Visit
Admission: RE | Admit: 2022-02-12 | Discharge: 2022-02-12 | Disposition: A | Discharge status: Home / Self Care | Payer: PPO | Source: Ambulatory Visit | Attending: Radiation Oncology | Admitting: Radiation Oncology

## 2022-02-12 DIAGNOSIS — Z510 Encounter for antineoplastic radiation therapy: Secondary | ICD-10-CM | POA: Diagnosis not present

## 2022-02-12 DIAGNOSIS — C61 Malignant neoplasm of prostate: Secondary | ICD-10-CM | POA: Diagnosis not present

## 2022-02-12 DIAGNOSIS — Z191 Hormone sensitive malignancy status: Secondary | ICD-10-CM | POA: Diagnosis not present

## 2022-02-13 ENCOUNTER — Ambulatory Visit
Admission: RE | Admit: 2022-02-13 | Discharge: 2022-02-13 | Disposition: A | Discharge status: Home / Self Care | Payer: PPO | Source: Ambulatory Visit | Attending: Radiation Oncology | Admitting: Radiation Oncology

## 2022-02-13 DIAGNOSIS — Z510 Encounter for antineoplastic radiation therapy: Secondary | ICD-10-CM | POA: Diagnosis not present

## 2022-02-13 DIAGNOSIS — C61 Malignant neoplasm of prostate: Secondary | ICD-10-CM | POA: Diagnosis not present

## 2022-02-13 DIAGNOSIS — Z191 Hormone sensitive malignancy status: Secondary | ICD-10-CM | POA: Diagnosis not present

## 2022-02-14 ENCOUNTER — Ambulatory Visit
Admission: RE | Admit: 2022-02-14 | Discharge: 2022-02-14 | Disposition: A | Discharge status: Home / Self Care | Payer: PPO | Source: Ambulatory Visit | Attending: Radiation Oncology | Admitting: Radiation Oncology

## 2022-02-14 ENCOUNTER — Other Ambulatory Visit: Payer: Self-pay

## 2022-02-14 DIAGNOSIS — Z191 Hormone sensitive malignancy status: Secondary | ICD-10-CM | POA: Diagnosis not present

## 2022-02-14 DIAGNOSIS — Z510 Encounter for antineoplastic radiation therapy: Secondary | ICD-10-CM | POA: Diagnosis not present

## 2022-02-14 DIAGNOSIS — C61 Malignant neoplasm of prostate: Secondary | ICD-10-CM | POA: Diagnosis not present

## 2022-02-15 ENCOUNTER — Ambulatory Visit
Admission: RE | Admit: 2022-02-15 | Discharge: 2022-02-15 | Disposition: A | Discharge status: Home / Self Care | Payer: PPO | Source: Ambulatory Visit | Attending: Radiation Oncology | Admitting: Radiation Oncology

## 2022-02-15 DIAGNOSIS — Z191 Hormone sensitive malignancy status: Secondary | ICD-10-CM | POA: Diagnosis not present

## 2022-02-15 DIAGNOSIS — C61 Malignant neoplasm of prostate: Secondary | ICD-10-CM | POA: Diagnosis not present

## 2022-02-15 DIAGNOSIS — Z510 Encounter for antineoplastic radiation therapy: Secondary | ICD-10-CM | POA: Diagnosis not present

## 2022-02-16 ENCOUNTER — Other Ambulatory Visit: Payer: Self-pay

## 2022-02-16 ENCOUNTER — Ambulatory Visit
Admission: RE | Admit: 2022-02-16 | Discharge: 2022-02-16 | Disposition: A | Discharge status: Home / Self Care | Payer: PPO | Source: Ambulatory Visit | Attending: Radiation Oncology | Admitting: Radiation Oncology

## 2022-02-16 DIAGNOSIS — Z191 Hormone sensitive malignancy status: Secondary | ICD-10-CM | POA: Diagnosis not present

## 2022-02-16 DIAGNOSIS — C61 Malignant neoplasm of prostate: Secondary | ICD-10-CM | POA: Diagnosis not present

## 2022-02-16 DIAGNOSIS — Z510 Encounter for antineoplastic radiation therapy: Secondary | ICD-10-CM | POA: Diagnosis not present

## 2022-02-19 ENCOUNTER — Other Ambulatory Visit: Payer: Self-pay

## 2022-02-19 ENCOUNTER — Ambulatory Visit
Admission: RE | Admit: 2022-02-19 | Discharge: 2022-02-19 | Disposition: A | Discharge status: Home / Self Care | Payer: PPO | Source: Ambulatory Visit | Attending: Radiation Oncology | Admitting: Radiation Oncology

## 2022-02-19 DIAGNOSIS — C61 Malignant neoplasm of prostate: Secondary | ICD-10-CM | POA: Diagnosis not present

## 2022-02-19 DIAGNOSIS — Z510 Encounter for antineoplastic radiation therapy: Secondary | ICD-10-CM | POA: Diagnosis not present

## 2022-02-19 DIAGNOSIS — Z191 Hormone sensitive malignancy status: Secondary | ICD-10-CM | POA: Diagnosis not present

## 2022-02-20 ENCOUNTER — Ambulatory Visit
Admission: RE | Admit: 2022-02-20 | Discharge: 2022-02-20 | Disposition: A | Discharge status: Home / Self Care | Payer: PPO | Source: Ambulatory Visit | Attending: Radiation Oncology | Admitting: Radiation Oncology

## 2022-02-20 ENCOUNTER — Other Ambulatory Visit: Payer: Self-pay

## 2022-02-20 DIAGNOSIS — Z191 Hormone sensitive malignancy status: Secondary | ICD-10-CM | POA: Diagnosis not present

## 2022-02-20 DIAGNOSIS — C61 Malignant neoplasm of prostate: Secondary | ICD-10-CM | POA: Diagnosis not present

## 2022-02-20 DIAGNOSIS — Z510 Encounter for antineoplastic radiation therapy: Secondary | ICD-10-CM | POA: Diagnosis not present

## 2022-02-20 LAB — RAD ONC ARIA SESSION SUMMARY
Course Elapsed Days: 27
Plan Fractions Treated to Date: 19
Plan Prescribed Dose Per Fraction: 1.8 Gy
Plan Total Fractions Prescribed: 25
Plan Total Prescribed Dose: 45 Gy
Reference Point Dosage Given to Date: 34.2 Gy
Reference Point Session Dosage Given: 1.8 Gy
Session Number: 19

## 2022-02-21 ENCOUNTER — Other Ambulatory Visit: Payer: Self-pay

## 2022-02-21 ENCOUNTER — Ambulatory Visit
Admission: RE | Admit: 2022-02-21 | Discharge: 2022-02-21 | Disposition: A | Discharge status: Home / Self Care | Payer: PPO | Source: Ambulatory Visit | Attending: Radiation Oncology | Admitting: Radiation Oncology

## 2022-02-21 DIAGNOSIS — C61 Malignant neoplasm of prostate: Secondary | ICD-10-CM | POA: Diagnosis not present

## 2022-02-21 DIAGNOSIS — Z510 Encounter for antineoplastic radiation therapy: Secondary | ICD-10-CM | POA: Diagnosis not present

## 2022-02-21 DIAGNOSIS — Z191 Hormone sensitive malignancy status: Secondary | ICD-10-CM | POA: Diagnosis not present

## 2022-02-21 LAB — RAD ONC ARIA SESSION SUMMARY
Course Elapsed Days: 28
Plan Fractions Treated to Date: 20
Plan Prescribed Dose Per Fraction: 1.8 Gy
Plan Total Fractions Prescribed: 25
Plan Total Prescribed Dose: 45 Gy
Reference Point Dosage Given to Date: 36 Gy
Reference Point Session Dosage Given: 1.8 Gy
Session Number: 20

## 2022-02-22 ENCOUNTER — Ambulatory Visit
Admission: RE | Admit: 2022-02-22 | Discharge: 2022-02-22 | Disposition: A | Discharge status: Home / Self Care | Payer: PPO | Source: Ambulatory Visit | Attending: Radiation Oncology | Admitting: Radiation Oncology

## 2022-02-22 ENCOUNTER — Other Ambulatory Visit: Payer: Self-pay

## 2022-02-22 DIAGNOSIS — Z191 Hormone sensitive malignancy status: Secondary | ICD-10-CM | POA: Diagnosis not present

## 2022-02-22 DIAGNOSIS — C61 Malignant neoplasm of prostate: Secondary | ICD-10-CM | POA: Diagnosis not present

## 2022-02-22 DIAGNOSIS — Z510 Encounter for antineoplastic radiation therapy: Secondary | ICD-10-CM | POA: Diagnosis not present

## 2022-02-22 LAB — RAD ONC ARIA SESSION SUMMARY
Course Elapsed Days: 29
Plan Fractions Treated to Date: 21
Plan Prescribed Dose Per Fraction: 1.8 Gy
Plan Total Fractions Prescribed: 25
Plan Total Prescribed Dose: 45 Gy
Reference Point Dosage Given to Date: 37.8 Gy
Reference Point Session Dosage Given: 1.8 Gy
Session Number: 21

## 2022-02-23 ENCOUNTER — Ambulatory Visit
Admission: RE | Admit: 2022-02-23 | Discharge: 2022-02-23 | Disposition: A | Discharge status: Home / Self Care | Payer: PPO | Source: Ambulatory Visit | Attending: Radiation Oncology | Admitting: Radiation Oncology

## 2022-02-23 ENCOUNTER — Other Ambulatory Visit: Payer: Self-pay

## 2022-02-23 DIAGNOSIS — C61 Malignant neoplasm of prostate: Secondary | ICD-10-CM | POA: Diagnosis not present

## 2022-02-23 DIAGNOSIS — Z191 Hormone sensitive malignancy status: Secondary | ICD-10-CM | POA: Diagnosis not present

## 2022-02-23 DIAGNOSIS — Z510 Encounter for antineoplastic radiation therapy: Secondary | ICD-10-CM | POA: Diagnosis not present

## 2022-02-23 LAB — RAD ONC ARIA SESSION SUMMARY
Course Elapsed Days: 30
Plan Fractions Treated to Date: 22
Plan Prescribed Dose Per Fraction: 1.8 Gy
Plan Total Fractions Prescribed: 25
Plan Total Prescribed Dose: 45 Gy
Reference Point Dosage Given to Date: 39.6 Gy
Reference Point Session Dosage Given: 1.8 Gy
Session Number: 22

## 2022-02-26 ENCOUNTER — Other Ambulatory Visit: Payer: Self-pay

## 2022-02-26 ENCOUNTER — Ambulatory Visit
Admission: RE | Admit: 2022-02-26 | Discharge: 2022-02-26 | Disposition: A | Discharge status: Home / Self Care | Payer: PPO | Source: Ambulatory Visit | Attending: Radiation Oncology | Admitting: Radiation Oncology

## 2022-02-26 DIAGNOSIS — Z510 Encounter for antineoplastic radiation therapy: Secondary | ICD-10-CM | POA: Diagnosis not present

## 2022-02-26 DIAGNOSIS — Z191 Hormone sensitive malignancy status: Secondary | ICD-10-CM | POA: Diagnosis not present

## 2022-02-26 DIAGNOSIS — C61 Malignant neoplasm of prostate: Secondary | ICD-10-CM | POA: Diagnosis not present

## 2022-02-26 LAB — RAD ONC ARIA SESSION SUMMARY
Course Elapsed Days: 33
Plan Fractions Treated to Date: 23
Plan Prescribed Dose Per Fraction: 1.8 Gy
Plan Total Fractions Prescribed: 25
Plan Total Prescribed Dose: 45 Gy
Reference Point Dosage Given to Date: 41.4 Gy
Reference Point Session Dosage Given: 1.8 Gy
Session Number: 23

## 2022-02-27 ENCOUNTER — Ambulatory Visit
Admission: RE | Admit: 2022-02-27 | Discharge: 2022-02-27 | Disposition: A | Discharge status: Home / Self Care | Payer: PPO | Source: Ambulatory Visit | Attending: Radiation Oncology | Admitting: Radiation Oncology

## 2022-02-27 ENCOUNTER — Other Ambulatory Visit: Payer: Self-pay

## 2022-02-27 DIAGNOSIS — Z191 Hormone sensitive malignancy status: Secondary | ICD-10-CM | POA: Diagnosis not present

## 2022-02-27 DIAGNOSIS — C61 Malignant neoplasm of prostate: Secondary | ICD-10-CM | POA: Diagnosis not present

## 2022-02-27 DIAGNOSIS — Z510 Encounter for antineoplastic radiation therapy: Secondary | ICD-10-CM | POA: Diagnosis not present

## 2022-02-27 LAB — RAD ONC ARIA SESSION SUMMARY
Course Elapsed Days: 34
Plan Fractions Treated to Date: 24
Plan Prescribed Dose Per Fraction: 1.8 Gy
Plan Total Fractions Prescribed: 25
Plan Total Prescribed Dose: 45 Gy
Reference Point Dosage Given to Date: 43.2 Gy
Reference Point Session Dosage Given: 1.8 Gy
Session Number: 24

## 2022-02-28 ENCOUNTER — Ambulatory Visit: Payer: PPO

## 2022-02-28 ENCOUNTER — Ambulatory Visit
Admission: RE | Admit: 2022-02-28 | Discharge: 2022-02-28 | Disposition: A | Discharge status: Home / Self Care | Payer: PPO | Source: Ambulatory Visit | Attending: Radiation Oncology | Admitting: Radiation Oncology

## 2022-02-28 ENCOUNTER — Other Ambulatory Visit: Payer: Self-pay

## 2022-02-28 DIAGNOSIS — Z191 Hormone sensitive malignancy status: Secondary | ICD-10-CM | POA: Diagnosis not present

## 2022-02-28 DIAGNOSIS — Z510 Encounter for antineoplastic radiation therapy: Secondary | ICD-10-CM | POA: Diagnosis not present

## 2022-02-28 DIAGNOSIS — C61 Malignant neoplasm of prostate: Secondary | ICD-10-CM | POA: Diagnosis not present

## 2022-02-28 LAB — RAD ONC ARIA SESSION SUMMARY
Course Elapsed Days: 35
Plan Fractions Treated to Date: 25
Plan Prescribed Dose Per Fraction: 1.8 Gy
Plan Total Fractions Prescribed: 25
Plan Total Prescribed Dose: 45 Gy
Reference Point Dosage Given to Date: 45 Gy
Reference Point Session Dosage Given: 1.8 Gy
Session Number: 25

## 2022-03-01 ENCOUNTER — Ambulatory Visit: Payer: PPO

## 2022-03-01 ENCOUNTER — Ambulatory Visit
Admission: RE | Admit: 2022-03-01 | Discharge: 2022-03-01 | Disposition: A | Discharge status: Home / Self Care | Payer: PPO | Source: Ambulatory Visit | Attending: Radiation Oncology | Admitting: Radiation Oncology

## 2022-03-01 ENCOUNTER — Other Ambulatory Visit: Payer: Self-pay

## 2022-03-01 DIAGNOSIS — Z191 Hormone sensitive malignancy status: Secondary | ICD-10-CM | POA: Diagnosis not present

## 2022-03-01 DIAGNOSIS — C61 Malignant neoplasm of prostate: Secondary | ICD-10-CM | POA: Diagnosis not present

## 2022-03-01 DIAGNOSIS — Z510 Encounter for antineoplastic radiation therapy: Secondary | ICD-10-CM | POA: Diagnosis not present

## 2022-03-01 LAB — RAD ONC ARIA SESSION SUMMARY
Course Elapsed Days: 36
Plan Fractions Treated to Date: 1
Plan Prescribed Dose Per Fraction: 2 Gy
Plan Total Fractions Prescribed: 15
Plan Total Prescribed Dose: 30 Gy
Reference Point Dosage Given to Date: 47 Gy
Reference Point Session Dosage Given: 2 Gy
Session Number: 26

## 2022-03-02 ENCOUNTER — Other Ambulatory Visit: Payer: Self-pay

## 2022-03-02 ENCOUNTER — Ambulatory Visit: Payer: PPO

## 2022-03-02 ENCOUNTER — Ambulatory Visit
Admission: RE | Admit: 2022-03-02 | Discharge: 2022-03-02 | Disposition: A | Discharge status: Home / Self Care | Payer: PPO | Source: Ambulatory Visit | Attending: Radiation Oncology | Admitting: Radiation Oncology

## 2022-03-02 DIAGNOSIS — C61 Malignant neoplasm of prostate: Secondary | ICD-10-CM | POA: Diagnosis not present

## 2022-03-02 DIAGNOSIS — Z510 Encounter for antineoplastic radiation therapy: Secondary | ICD-10-CM | POA: Diagnosis not present

## 2022-03-02 DIAGNOSIS — Z191 Hormone sensitive malignancy status: Secondary | ICD-10-CM | POA: Diagnosis not present

## 2022-03-02 LAB — RAD ONC ARIA SESSION SUMMARY
Course Elapsed Days: 37
Plan Fractions Treated to Date: 2
Plan Prescribed Dose Per Fraction: 2 Gy
Plan Total Fractions Prescribed: 15
Plan Total Prescribed Dose: 30 Gy
Reference Point Dosage Given to Date: 49 Gy
Reference Point Session Dosage Given: 2 Gy
Session Number: 27

## 2022-03-05 ENCOUNTER — Other Ambulatory Visit: Payer: Self-pay

## 2022-03-05 ENCOUNTER — Ambulatory Visit
Admission: RE | Admit: 2022-03-05 | Discharge: 2022-03-05 | Disposition: A | Discharge status: Home / Self Care | Payer: PPO | Source: Ambulatory Visit | Attending: Radiation Oncology | Admitting: Radiation Oncology

## 2022-03-05 DIAGNOSIS — Z510 Encounter for antineoplastic radiation therapy: Secondary | ICD-10-CM | POA: Diagnosis not present

## 2022-03-05 DIAGNOSIS — C61 Malignant neoplasm of prostate: Secondary | ICD-10-CM | POA: Insufficient documentation

## 2022-03-05 DIAGNOSIS — Z191 Hormone sensitive malignancy status: Secondary | ICD-10-CM | POA: Diagnosis not present

## 2022-03-05 LAB — RAD ONC ARIA SESSION SUMMARY
Course Elapsed Days: 40
Plan Fractions Treated to Date: 3
Plan Prescribed Dose Per Fraction: 2 Gy
Plan Total Fractions Prescribed: 15
Plan Total Prescribed Dose: 30 Gy
Reference Point Dosage Given to Date: 51 Gy
Reference Point Session Dosage Given: 2 Gy
Session Number: 28

## 2022-03-06 ENCOUNTER — Other Ambulatory Visit: Payer: Self-pay

## 2022-03-06 ENCOUNTER — Ambulatory Visit
Admission: RE | Admit: 2022-03-06 | Discharge: 2022-03-06 | Disposition: A | Discharge status: Home / Self Care | Payer: PPO | Source: Ambulatory Visit | Attending: Radiation Oncology | Admitting: Radiation Oncology

## 2022-03-06 DIAGNOSIS — Z510 Encounter for antineoplastic radiation therapy: Secondary | ICD-10-CM | POA: Diagnosis not present

## 2022-03-06 DIAGNOSIS — C61 Malignant neoplasm of prostate: Secondary | ICD-10-CM | POA: Diagnosis not present

## 2022-03-06 DIAGNOSIS — Z191 Hormone sensitive malignancy status: Secondary | ICD-10-CM | POA: Diagnosis not present

## 2022-03-06 LAB — RAD ONC ARIA SESSION SUMMARY
Course Elapsed Days: 41
Plan Fractions Treated to Date: 4
Plan Prescribed Dose Per Fraction: 2 Gy
Plan Total Fractions Prescribed: 15
Plan Total Prescribed Dose: 30 Gy
Reference Point Dosage Given to Date: 53 Gy
Reference Point Session Dosage Given: 2 Gy
Session Number: 29

## 2022-03-07 ENCOUNTER — Other Ambulatory Visit: Payer: Self-pay

## 2022-03-07 ENCOUNTER — Ambulatory Visit
Admission: RE | Admit: 2022-03-07 | Discharge: 2022-03-07 | Disposition: A | Discharge status: Home / Self Care | Payer: PPO | Source: Ambulatory Visit | Attending: Radiation Oncology | Admitting: Radiation Oncology

## 2022-03-07 DIAGNOSIS — Z510 Encounter for antineoplastic radiation therapy: Secondary | ICD-10-CM | POA: Diagnosis not present

## 2022-03-07 DIAGNOSIS — Z191 Hormone sensitive malignancy status: Secondary | ICD-10-CM | POA: Diagnosis not present

## 2022-03-07 DIAGNOSIS — C61 Malignant neoplasm of prostate: Secondary | ICD-10-CM | POA: Diagnosis not present

## 2022-03-07 LAB — RAD ONC ARIA SESSION SUMMARY
Course Elapsed Days: 42
Plan Fractions Treated to Date: 5
Plan Prescribed Dose Per Fraction: 2 Gy
Plan Total Fractions Prescribed: 15
Plan Total Prescribed Dose: 30 Gy
Reference Point Dosage Given to Date: 55 Gy
Reference Point Session Dosage Given: 2 Gy
Session Number: 30

## 2022-03-08 ENCOUNTER — Other Ambulatory Visit: Payer: Self-pay

## 2022-03-08 ENCOUNTER — Ambulatory Visit
Admission: RE | Admit: 2022-03-08 | Discharge: 2022-03-08 | Disposition: A | Discharge status: Home / Self Care | Payer: PPO | Source: Ambulatory Visit | Attending: Radiation Oncology | Admitting: Radiation Oncology

## 2022-03-08 DIAGNOSIS — Z510 Encounter for antineoplastic radiation therapy: Secondary | ICD-10-CM | POA: Diagnosis not present

## 2022-03-08 DIAGNOSIS — Z191 Hormone sensitive malignancy status: Secondary | ICD-10-CM | POA: Diagnosis not present

## 2022-03-08 DIAGNOSIS — C61 Malignant neoplasm of prostate: Secondary | ICD-10-CM | POA: Diagnosis not present

## 2022-03-08 LAB — RAD ONC ARIA SESSION SUMMARY
Course Elapsed Days: 43
Plan Fractions Treated to Date: 6
Plan Prescribed Dose Per Fraction: 2 Gy
Plan Total Fractions Prescribed: 15
Plan Total Prescribed Dose: 30 Gy
Reference Point Dosage Given to Date: 57 Gy
Reference Point Session Dosage Given: 2 Gy
Session Number: 31

## 2022-03-09 ENCOUNTER — Other Ambulatory Visit: Payer: Self-pay

## 2022-03-09 ENCOUNTER — Ambulatory Visit
Admission: RE | Admit: 2022-03-09 | Discharge: 2022-03-09 | Disposition: A | Discharge status: Home / Self Care | Payer: PPO | Source: Ambulatory Visit | Attending: Radiation Oncology | Admitting: Radiation Oncology

## 2022-03-09 DIAGNOSIS — Z510 Encounter for antineoplastic radiation therapy: Secondary | ICD-10-CM | POA: Diagnosis not present

## 2022-03-09 DIAGNOSIS — C61 Malignant neoplasm of prostate: Secondary | ICD-10-CM | POA: Diagnosis not present

## 2022-03-09 DIAGNOSIS — Z191 Hormone sensitive malignancy status: Secondary | ICD-10-CM | POA: Diagnosis not present

## 2022-03-09 LAB — RAD ONC ARIA SESSION SUMMARY
Course Elapsed Days: 44
Plan Fractions Treated to Date: 7
Plan Prescribed Dose Per Fraction: 2 Gy
Plan Total Fractions Prescribed: 15
Plan Total Prescribed Dose: 30 Gy
Reference Point Dosage Given to Date: 59 Gy
Reference Point Session Dosage Given: 2 Gy
Session Number: 32

## 2022-03-12 ENCOUNTER — Other Ambulatory Visit: Payer: Self-pay

## 2022-03-12 ENCOUNTER — Ambulatory Visit
Admission: RE | Admit: 2022-03-12 | Discharge: 2022-03-12 | Disposition: A | Discharge status: Home / Self Care | Payer: PPO | Source: Ambulatory Visit | Attending: Radiation Oncology | Admitting: Radiation Oncology

## 2022-03-12 DIAGNOSIS — Z510 Encounter for antineoplastic radiation therapy: Secondary | ICD-10-CM | POA: Diagnosis not present

## 2022-03-12 DIAGNOSIS — C61 Malignant neoplasm of prostate: Secondary | ICD-10-CM | POA: Diagnosis not present

## 2022-03-12 DIAGNOSIS — Z191 Hormone sensitive malignancy status: Secondary | ICD-10-CM | POA: Diagnosis not present

## 2022-03-12 LAB — RAD ONC ARIA SESSION SUMMARY
Course Elapsed Days: 47
Plan Fractions Treated to Date: 8
Plan Prescribed Dose Per Fraction: 2 Gy
Plan Total Fractions Prescribed: 15
Plan Total Prescribed Dose: 30 Gy
Reference Point Dosage Given to Date: 61 Gy
Reference Point Session Dosage Given: 2 Gy
Session Number: 33

## 2022-03-13 ENCOUNTER — Other Ambulatory Visit: Payer: Self-pay

## 2022-03-13 ENCOUNTER — Ambulatory Visit
Admission: RE | Admit: 2022-03-13 | Discharge: 2022-03-13 | Disposition: A | Discharge status: Home / Self Care | Payer: PPO | Source: Ambulatory Visit | Attending: Radiation Oncology | Admitting: Radiation Oncology

## 2022-03-13 DIAGNOSIS — Z191 Hormone sensitive malignancy status: Secondary | ICD-10-CM | POA: Diagnosis not present

## 2022-03-13 DIAGNOSIS — C61 Malignant neoplasm of prostate: Secondary | ICD-10-CM | POA: Diagnosis not present

## 2022-03-13 DIAGNOSIS — Z510 Encounter for antineoplastic radiation therapy: Secondary | ICD-10-CM | POA: Diagnosis not present

## 2022-03-13 LAB — RAD ONC ARIA SESSION SUMMARY
Course Elapsed Days: 48
Plan Fractions Treated to Date: 9
Plan Prescribed Dose Per Fraction: 2 Gy
Plan Total Fractions Prescribed: 15
Plan Total Prescribed Dose: 30 Gy
Reference Point Dosage Given to Date: 63 Gy
Reference Point Session Dosage Given: 2 Gy
Session Number: 34

## 2022-03-14 ENCOUNTER — Other Ambulatory Visit: Payer: Self-pay

## 2022-03-14 ENCOUNTER — Ambulatory Visit
Admission: RE | Admit: 2022-03-14 | Discharge: 2022-03-14 | Disposition: A | Discharge status: Home / Self Care | Payer: PPO | Source: Ambulatory Visit | Attending: Radiation Oncology | Admitting: Radiation Oncology

## 2022-03-14 DIAGNOSIS — Z191 Hormone sensitive malignancy status: Secondary | ICD-10-CM | POA: Diagnosis not present

## 2022-03-14 DIAGNOSIS — C61 Malignant neoplasm of prostate: Secondary | ICD-10-CM | POA: Diagnosis not present

## 2022-03-14 DIAGNOSIS — Z510 Encounter for antineoplastic radiation therapy: Secondary | ICD-10-CM | POA: Diagnosis not present

## 2022-03-14 LAB — RAD ONC ARIA SESSION SUMMARY
Course Elapsed Days: 49
Plan Fractions Treated to Date: 10
Plan Prescribed Dose Per Fraction: 2 Gy
Plan Total Fractions Prescribed: 15
Plan Total Prescribed Dose: 30 Gy
Reference Point Dosage Given to Date: 65 Gy
Reference Point Session Dosage Given: 2 Gy
Session Number: 35

## 2022-03-15 ENCOUNTER — Other Ambulatory Visit: Payer: Self-pay

## 2022-03-15 ENCOUNTER — Ambulatory Visit
Admission: RE | Admit: 2022-03-15 | Discharge: 2022-03-15 | Disposition: A | Discharge status: Home / Self Care | Payer: PPO | Source: Ambulatory Visit | Attending: Radiation Oncology | Admitting: Radiation Oncology

## 2022-03-15 DIAGNOSIS — Z191 Hormone sensitive malignancy status: Secondary | ICD-10-CM | POA: Diagnosis not present

## 2022-03-15 DIAGNOSIS — C61 Malignant neoplasm of prostate: Secondary | ICD-10-CM | POA: Diagnosis not present

## 2022-03-15 DIAGNOSIS — Z510 Encounter for antineoplastic radiation therapy: Secondary | ICD-10-CM | POA: Diagnosis not present

## 2022-03-15 LAB — RAD ONC ARIA SESSION SUMMARY
Course Elapsed Days: 50
Plan Fractions Treated to Date: 11
Plan Prescribed Dose Per Fraction: 2 Gy
Plan Total Fractions Prescribed: 15
Plan Total Prescribed Dose: 30 Gy
Reference Point Dosage Given to Date: 67 Gy
Reference Point Session Dosage Given: 2 Gy
Session Number: 36

## 2022-03-16 ENCOUNTER — Ambulatory Visit: Payer: PPO

## 2022-03-19 ENCOUNTER — Ambulatory Visit
Admission: RE | Admit: 2022-03-19 | Discharge: 2022-03-19 | Disposition: A | Discharge status: Home / Self Care | Payer: PPO | Source: Ambulatory Visit | Attending: Radiation Oncology | Admitting: Radiation Oncology

## 2022-03-19 ENCOUNTER — Other Ambulatory Visit: Payer: Self-pay

## 2022-03-19 DIAGNOSIS — C61 Malignant neoplasm of prostate: Secondary | ICD-10-CM | POA: Diagnosis not present

## 2022-03-19 DIAGNOSIS — Z510 Encounter for antineoplastic radiation therapy: Secondary | ICD-10-CM | POA: Diagnosis not present

## 2022-03-19 DIAGNOSIS — Z191 Hormone sensitive malignancy status: Secondary | ICD-10-CM | POA: Diagnosis not present

## 2022-03-19 LAB — RAD ONC ARIA SESSION SUMMARY
Course Elapsed Days: 54
Plan Fractions Treated to Date: 12
Plan Prescribed Dose Per Fraction: 2 Gy
Plan Total Fractions Prescribed: 15
Plan Total Prescribed Dose: 30 Gy
Reference Point Dosage Given to Date: 69 Gy
Reference Point Session Dosage Given: 2 Gy
Session Number: 37

## 2022-03-20 ENCOUNTER — Ambulatory Visit
Admission: RE | Admit: 2022-03-20 | Discharge: 2022-03-20 | Disposition: A | Discharge status: Home / Self Care | Payer: PPO | Source: Ambulatory Visit | Attending: Radiation Oncology | Admitting: Radiation Oncology

## 2022-03-20 ENCOUNTER — Other Ambulatory Visit: Payer: Self-pay

## 2022-03-20 ENCOUNTER — Ambulatory Visit: Payer: PPO

## 2022-03-20 DIAGNOSIS — C61 Malignant neoplasm of prostate: Secondary | ICD-10-CM | POA: Diagnosis not present

## 2022-03-20 DIAGNOSIS — Z510 Encounter for antineoplastic radiation therapy: Secondary | ICD-10-CM | POA: Diagnosis not present

## 2022-03-20 DIAGNOSIS — Z191 Hormone sensitive malignancy status: Secondary | ICD-10-CM | POA: Diagnosis not present

## 2022-03-20 LAB — RAD ONC ARIA SESSION SUMMARY
Course Elapsed Days: 55
Plan Fractions Treated to Date: 13
Plan Prescribed Dose Per Fraction: 2 Gy
Plan Total Fractions Prescribed: 15
Plan Total Prescribed Dose: 30 Gy
Reference Point Dosage Given to Date: 71 Gy
Reference Point Session Dosage Given: 2 Gy
Session Number: 38

## 2022-03-21 ENCOUNTER — Ambulatory Visit: Payer: PPO

## 2022-03-21 ENCOUNTER — Other Ambulatory Visit: Payer: Self-pay

## 2022-03-21 ENCOUNTER — Ambulatory Visit
Admission: RE | Admit: 2022-03-21 | Discharge: 2022-03-21 | Disposition: A | Discharge status: Home / Self Care | Payer: PPO | Source: Ambulatory Visit | Attending: Radiation Oncology | Admitting: Radiation Oncology

## 2022-03-21 DIAGNOSIS — C61 Malignant neoplasm of prostate: Secondary | ICD-10-CM | POA: Diagnosis not present

## 2022-03-21 DIAGNOSIS — Z510 Encounter for antineoplastic radiation therapy: Secondary | ICD-10-CM | POA: Diagnosis not present

## 2022-03-21 DIAGNOSIS — N401 Enlarged prostate with lower urinary tract symptoms: Secondary | ICD-10-CM | POA: Diagnosis not present

## 2022-03-21 DIAGNOSIS — R350 Frequency of micturition: Secondary | ICD-10-CM | POA: Diagnosis not present

## 2022-03-21 DIAGNOSIS — Z191 Hormone sensitive malignancy status: Secondary | ICD-10-CM | POA: Diagnosis not present

## 2022-03-21 DIAGNOSIS — R8271 Bacteriuria: Secondary | ICD-10-CM | POA: Diagnosis not present

## 2022-03-21 LAB — RAD ONC ARIA SESSION SUMMARY
Course Elapsed Days: 56
Plan Fractions Treated to Date: 14
Plan Prescribed Dose Per Fraction: 2 Gy
Plan Total Fractions Prescribed: 15
Plan Total Prescribed Dose: 30 Gy
Reference Point Dosage Given to Date: 73 Gy
Reference Point Session Dosage Given: 2 Gy
Session Number: 39

## 2022-03-22 ENCOUNTER — Other Ambulatory Visit: Payer: Self-pay

## 2022-03-22 ENCOUNTER — Encounter: Payer: Self-pay | Admitting: Urology

## 2022-03-22 ENCOUNTER — Ambulatory Visit
Admission: RE | Admit: 2022-03-22 | Discharge: 2022-03-22 | Disposition: A | Discharge status: Home / Self Care | Payer: PPO | Source: Ambulatory Visit | Attending: Radiation Oncology | Admitting: Radiation Oncology

## 2022-03-22 DIAGNOSIS — Z510 Encounter for antineoplastic radiation therapy: Secondary | ICD-10-CM | POA: Diagnosis not present

## 2022-03-22 DIAGNOSIS — C61 Malignant neoplasm of prostate: Secondary | ICD-10-CM

## 2022-03-22 DIAGNOSIS — Z191 Hormone sensitive malignancy status: Secondary | ICD-10-CM | POA: Diagnosis not present

## 2022-03-22 LAB — RAD ONC ARIA SESSION SUMMARY
Course Elapsed Days: 57
Plan Fractions Treated to Date: 15
Plan Prescribed Dose Per Fraction: 2 Gy
Plan Total Fractions Prescribed: 15
Plan Total Prescribed Dose: 30 Gy
Reference Point Dosage Given to Date: 75 Gy
Reference Point Session Dosage Given: 2 Gy
Session Number: 40

## 2022-04-13 DIAGNOSIS — D225 Melanocytic nevi of trunk: Secondary | ICD-10-CM | POA: Diagnosis not present

## 2022-04-13 DIAGNOSIS — X32XXXD Exposure to sunlight, subsequent encounter: Secondary | ICD-10-CM | POA: Diagnosis not present

## 2022-04-13 DIAGNOSIS — L570 Actinic keratosis: Secondary | ICD-10-CM | POA: Diagnosis not present

## 2022-04-13 DIAGNOSIS — L821 Other seborrheic keratosis: Secondary | ICD-10-CM | POA: Diagnosis not present

## 2022-04-24 ENCOUNTER — Encounter: Payer: Self-pay | Admitting: Urology

## 2022-04-24 NOTE — Progress Notes (Signed)
Telephone appointment. I spoke w/ patient, verified identity and began nursing interview. Patient reports nocturia x4. No other issues reported at this time.  Meaningful use complete. I-PSS score of 4-mild. Flomax as directed. Urology appointment- None-per patient.  Reminded patient of his 10:30am-04/25/22 telephone appointment w/Ashlyn Bruning PA-C. I left my extension [phone] in case patient needs anything. Patient verbalized understanding.  Patient contact [phone]

## 2022-04-25 ENCOUNTER — Ambulatory Visit
Admission: RE | Admit: 2022-04-25 | Discharge: 2022-04-25 | Disposition: A | Discharge status: Home / Self Care | Payer: PPO | Source: Ambulatory Visit | Attending: Urology | Admitting: Urology

## 2022-04-25 DIAGNOSIS — C61 Malignant neoplasm of prostate: Secondary | ICD-10-CM

## 2022-04-25 NOTE — Progress Notes (Signed)
  Radiation Oncology         [phone] ________________________________  Name: Thomas [name] MRN: [id number]  Date: 03/22/2022  DOB: 10-21-1946  End of Treatment Note  Diagnosis:    76 y.o. gentleman with stage T1c adenocarcinoma of the prostate with a Gleason's score of 3+5 and a PSA of 9.48     Indication for treatment:  Curative, Definitive Radiotherapy cocnurrent with LT-ADT (started 09/18/21)  Radiation treatment dates:   01/24/22 - 03/22/22  Site/dose:  1. The prostate, seminal vesicles, and pelvic lymph nodes were initially treated to 45 Gy in 25 fractions of 1.8 Gy  2. The prostate only was boosted to 75 Gy with 15 additional fractions of 2.0 Gy   Beams/energy:  1. The prostate, seminal vesicles, and pelvic lymph nodes were initially treated using VMAT intensity modulated radiotherapy delivering 6 megavolt photons. Image guidance was performed with CB-CT studies prior to each fraction. He was immobilized with a body fix lower extremity mold.  2. the prostate only was boosted using VMAT intensity modulated radiotherapy delivering 6 megavolt photons. Image guidance was performed with CB-CT studies prior to each fraction. He was immobilized with a body fix lower extremity mold.  Narrative: The patient tolerated radiation treatment relatively well with only minor urinary irritation and modest fatigue.  He did report increased nocturia x5 as well as a weaker flow of stream and incomplete bladder emptying despite taking Flomax daily as prescribed.  He also experienced some loose stools but denied any abdominal pain, nausea or vomiting.  Plan: The patient has completed radiation treatment. He will return to radiation oncology clinic for routine followup in one month. I advised him to call or return sooner if he has any questions or concerns related to his recovery or treatment. ________________________________  Sheral Apley. Tammi Klippel, M.D.

## 2022-04-25 NOTE — Progress Notes (Signed)
Radiation Oncology         [phone] ________________________________  Name: Thomas [name] MRN: [id number]  Date: 04/25/2022  DOB: 04-Dec-1945  Post Treatment Note  CC: [name], MD  Raynelle Bring, MD  Diagnosis:   76 y.o. gentleman with stage T1c adenocarcinoma of the prostate with a Gleason's score of 3+5 and a PSA of 9.48    Interval Since Last Radiation:  5 weeks;  cocnurrent with LT-ADT (started 09/18/21) 01/24/22 - 03/22/22: 1. The prostate, seminal vesicles, and pelvic lymph nodes were initially treated to 45 Gy in 25 fractions of 1.8 Gy  2. The prostate only was boosted to 75 Gy with 15 additional fractions of 2.0 Gy   Narrative:  I spoke with the patient to conduct his routine scheduled 1 month follow up visit via telephone to spare the patient unnecessary potential exposure in the healthcare setting during the current COVID-19 pandemic.  The patient was notified in advance and gave permission to proceed with this visit format.  He tolerated radiation treatment relatively well with only minor urinary irritation and modest fatigue.  He did report increased nocturia x5 as well as a weaker flow of stream and incomplete bladder emptying despite taking Flomax daily as prescribed.  He also experienced some loose stools but denied any abdominal pain, nausea or vomiting.                              On review of systems, the patient states that he is doing well in general.  His LUTS have improved and he is now down to only getting up to void 3-4 times per night.  He continues with some mild urgency but specifically denies gross hematuria, dysuria, straining to void, incomplete bladder emptying or worsening incontinence.  He has not been taking the Flomax since the time of his TURP procedure in January 2023.  He reports a healthy appetite and is maintaining his weight.  He feels like he is tolerating the ADT fairly well despite some mild decreased stamina.  However, he has been able  to remain active and has not been bothered with hot flashes which he is pleased with.  He had a follow-up visit with [name] on 03/21/2022 and his PSA drawn prior to that visit had responded nicely, down to 0.14.  He denies any abdominal pain, nausea, vomiting, diarrhea or constipation.  Overall, he is pleased with his progress to date.  ALLERGIES:  has No Known Allergies.  Meds: Current Outpatient Medications  Medication Sig Dispense Refill   acetaminophen (TYLENOL) 500 MG tablet Take 1,000 mg by mouth every 6 (six) hours as needed (pain.).     Calcium Carb-Cholecalciferol (CALCIUM 600/VITAMIN D3) 600-800 MG-UNIT TABS Take 1 tablet by mouth in the morning and at bedtime.     docusate sodium (COLACE) 100 MG capsule Take 1 capsule (100 mg total) by mouth 2 (two) times daily. 30 capsule 0   Magnesium Oxide 250 MG TABS Take 250 mg by mouth at bedtime.     metoprolol tartrate (LOPRESSOR) 25 MG tablet TAKE 0.5 TABLETS BY MOUTH 2 TIMES DAILY. 90 tablet 1   tamsulosin (FLOMAX) 0.4 MG CAPS capsule Take 0.4 mg by mouth at bedtime.     traMADol (ULTRAM) 50 MG tablet Take 1-2 tablets (50-100 mg total) by mouth every 6 (six) hours as needed (pain). 15 tablet 0   No current facility-administered medications for this encounter.  Physical Findings:  vitals were not taken for this visit.  Pain Assessment Pain Score: 0-No pain/10 Unable to assess due to telephone follow-up visit format.  Lab Findings: Lab Results  Component Value Date   WBC 7.8 11/14/2021   HGB 13.8 11/14/2021   HCT 40.8 11/14/2021   MCV 99.0 11/14/2021   PLT 226 11/14/2021     Radiographic Findings: No results found.  Impression/Plan: 1. 76 y.o. gentleman with stage T1c adenocarcinoma of the prostate with a Gleason's score of 3+5 and a PSA of 9.48. He will continue to follow up with urology for ongoing PSA determinations and has an appointment scheduled with [name] in November 2023.  He had a recent follow-up visit  with [name] on 03/21/2022 and PSA drawn prior to that visit was significantly improved, at 0.14.  He did receive another 58-monthEligard injection at that visit and feels that he continues to tolerate the ADT fairly well.  He understands what to expect with regards to PSA monitoring going forward.  He still has some Flomax at home so he is planning to try taking 1 capsule at night to see if this helps to further improve the nocturia in the short-term.  I will look forward to following his response to treatment via correspondence with urology, and would be happy to continue to participate in his care if clinically indicated. I talked to the patient about what to expect in the future, including his risk for erectile dysfunction and rectal bleeding. I encouraged him to call or return to the office if he has any questions regarding his previous radiation or possible radiation side effects. He was comfortable with this plan and will follow up as needed.      ANicholos Johns PA-C

## 2022-06-28 DIAGNOSIS — E785 Hyperlipidemia, unspecified: Secondary | ICD-10-CM | POA: Diagnosis not present

## 2022-06-28 DIAGNOSIS — I341 Nonrheumatic mitral (valve) prolapse: Secondary | ICD-10-CM | POA: Diagnosis not present

## 2022-06-28 DIAGNOSIS — C61 Malignant neoplasm of prostate: Secondary | ICD-10-CM | POA: Diagnosis not present

## 2022-07-17 ENCOUNTER — Other Ambulatory Visit: Payer: Self-pay | Admitting: Cardiology

## 2022-07-20 DIAGNOSIS — Z0000 Encounter for general adult medical examination without abnormal findings: Secondary | ICD-10-CM | POA: Diagnosis not present

## 2022-07-20 DIAGNOSIS — C61 Malignant neoplasm of prostate: Secondary | ICD-10-CM | POA: Diagnosis not present

## 2022-07-20 DIAGNOSIS — I499 Cardiac arrhythmia, unspecified: Secondary | ICD-10-CM | POA: Diagnosis not present

## 2022-07-20 DIAGNOSIS — D7589 Other specified diseases of blood and blood-forming organs: Secondary | ICD-10-CM | POA: Diagnosis not present

## 2022-07-20 DIAGNOSIS — E785 Hyperlipidemia, unspecified: Secondary | ICD-10-CM | POA: Diagnosis not present

## 2022-07-20 DIAGNOSIS — M79671 Pain in right foot: Secondary | ICD-10-CM | POA: Diagnosis not present

## 2022-07-20 DIAGNOSIS — M79672 Pain in left foot: Secondary | ICD-10-CM | POA: Diagnosis not present

## 2022-07-20 DIAGNOSIS — I340 Nonrheumatic mitral (valve) insufficiency: Secondary | ICD-10-CM | POA: Diagnosis not present

## 2022-08-13 ENCOUNTER — Other Ambulatory Visit: Payer: Self-pay | Admitting: Cardiology

## 2022-09-24 DIAGNOSIS — C61 Malignant neoplasm of prostate: Secondary | ICD-10-CM | POA: Diagnosis not present

## 2022-09-24 DIAGNOSIS — Z1211 Encounter for screening for malignant neoplasm of colon: Secondary | ICD-10-CM | POA: Diagnosis not present

## 2022-10-02 ENCOUNTER — Encounter: Payer: Self-pay | Admitting: Cardiology

## 2022-10-02 ENCOUNTER — Ambulatory Visit: Payer: PPO | Attending: Cardiology | Admitting: Cardiology

## 2022-10-02 ENCOUNTER — Telehealth: Payer: Self-pay | Admitting: Cardiology

## 2022-10-02 VITALS — BP 100/60 | HR 75 | Ht 70.0 in | Wt 177.0 lb

## 2022-10-02 DIAGNOSIS — I340 Nonrheumatic mitral (valve) insufficiency: Secondary | ICD-10-CM

## 2022-10-02 DIAGNOSIS — Z9889 Other specified postprocedural states: Secondary | ICD-10-CM

## 2022-10-02 NOTE — Progress Notes (Signed)
Cardiology Office Note:    Date:  10/02/2022   ID:  Thomas [name], DOB Oct 13, 1946, MRN [id number]  PCP:  [name], MD   [facility] Providers Cardiologist:  Candee Furbish, MD     Referring MD: [name], MD    History of Present Illness:    Thomas [name] is a 76 y.o. male here for follow-up of irregular heartbeat. He had a mitral valve repair minimally invasive on January 24, 2021 for mitral valve prolapse with Barlow's type myxomatous degenerative disease and severe asymptomatic primary mitral regurgitation.  Dr. Roxy Manns performed surgery.  Did well postoperatively.  At his last visit he reported he was doing well. He had recently walked 2 miles with his dog.Echocardiogram reviewed on 03/09/2021 looked great.  He did have one day where his heart felt heart funny. He had forgotten to take one of his medications; Metoprolol.  Outlet bladder, nocturia. Does not ike flomax  [name]. BPH.   Today, He reports that he is doing well over all. He has had some bad luck since his heart procedure. He had a bad fall where he had to have his ligament repaired in his leg. He has undergone radiation due to his prostate cancer changing to a fast growing form. He has not had any issues with his heart since the procedure.   Recently he started taking the rosuvastatin. He is taking it on Mondays and Fridays, but he was not sure on the dosage he is taking, but he will call back to give the correct amount. He has also started a glucosamine supplement but he is not sure of the dosage.   He denies any palpitations, chest pain, shortness of breath, or peripheral edema. No lightheadedness, headaches, syncope, orthopnea, or PND.   (+)  Past Medical History:  Diagnosis Date   Arthritis    Bilateral inguinal hernia    BPH (benign prostatic hyperplasia)    Cancer (HCC)    prostate cancer; per patient being followed by Dr [name] at [facility] urology ; currently no chemotherapy    Heart murmur     Mitral regurgitation    Mitral valve prolapse    now seeing [name]    Prostate cancer [facility])    S/P minimally-invasive mitral valve repair 01/24/2021   Complex valvuloplasty including quadrangular resection of posterior leaflet with 32 mm Sorin Memo 4D ring annuloplasty via right mini thoracotomy result    Past Surgical History:  Procedure Laterality Date   BUBBLE STUDY  11/23/2020   Procedure: BUBBLE STUDY;  Surgeon: [name], MD;  Location: [place];  Service: Cardiovascular;;   FEMORAL HERNIA REPAIR Right 07/22/2017   Procedure: HERNIA REPAIR FEMORAL;  Surgeon: Jackolyn Confer, MD;  Location: [facility];  Service: General;  Laterality: Right;   GOLD SEED IMPLANT N/A 11/27/2021   Procedure: GOLD SEED IMPLANT;  Surgeon: Raynelle Bring, MD;  Location[facility];  Service: Urology;  Laterality: N/A;   INGUINAL HERNIA REPAIR N/A 07/22/2017   Procedure: LAPAROSCOPIC BILATERAL INGUINAL HERNIA REPAIR WITH MESH;  Surgeon: Jackolyn Confer, MD;  Location: [facility];  Service: General;  Laterality: N/A;   INSERTION OF MESH Bilateral 07/22/2017   Procedure: INSERTION OF MESH;  Surgeon: Jackolyn Confer, MD;  [facility];  Service: General;  Laterality: Bilateral;   MITRAL VALVE REPAIR Right 01/24/2021   Procedure: MINIMALLY INVASIVE MITRAL VALVE REPAIR (MVR) USING 4D MEMO RING SIZE 32MM;  Surgeon: Rexene Alberts, MD;  Location: [place];  Service: Open  Heart Surgery;  Laterality: Right;   PROSTATE BIOPSY  07/2021   QUADRICEPS TENDON REPAIR Left 08/09/2021   Procedure: OPEN LEFT KNEE QUAD REPAIR;  Surgeon: Nicholes Stairs, MD;  Location[facility];  Service: Orthopedics;  Laterality: Left;   RIGHT/LEFT HEART CATH AND CORONARY ANGIOGRAPHY N/A 12/23/2020   Procedure: RIGHT/LEFT HEART CATH AND CORONARY ANGIOGRAPHY;  Surgeon: Sherren Mocha, MD;  Location: [place] CV LAB;  Service: Cardiovascular;  Laterality: N/A;   SPACE OAR INSTILLATION N/A 11/27/2021   Procedure: SPACE OAR  INSTILLATION;  Surgeon: Raynelle Bring, MD;  [facility];  Service: Urology;  Laterality: N/A;   TEE WITHOUT CARDIOVERSION N/A 11/23/2020   Procedure: TRANSESOPHAGEAL ECHOCARDIOGRAM (TEE);  Surgeon: [name], MD;  Location: [place];  Service: Cardiovascular;  Laterality: N/A;   TEE WITHOUT CARDIOVERSION N/A 01/24/2021   Procedure: TRANSESOPHAGEAL ECHOCARDIOGRAM (TEE);  Surgeon: Rexene Alberts, MD;  Location: [place];  Service: Open Heart Surgery;  Laterality: N/A;   TONSILLECTOMY AND ADENOIDECTOMY     as a child   TRANSRECTAL ULTRASOUND N/A 11/27/2021   Procedure: TRANSRECTAL ULTRASOUND;  Surgeon: Raynelle Bring, MD;  [facility];  Service: Urology;  Laterality: N/A;   TRANSURETHRAL RESECTION OF PROSTATE N/A 11/27/2021   Procedure: TRANSURETHRAL RESECTION OF THE PROSTATE (TURP) WITH CYSTOSCOPY/;  Surgeon: Raynelle Bring, MD;  [facility];  Service: Urology;  Laterality: N/A;   VASECTOMY  1977    Current Medications: Current Meds  Medication Sig   aspirin EC 81 MG tablet Take 81 mg by mouth daily.   Calcium Carb-Cholecalciferol (CALCIUM 600/VITAMIN D3) 600-800 MG-UNIT TABS Take 1 tablet by mouth in the morning and at bedtime.   Magnesium Oxide 250 MG TABS Take 250 mg by mouth at bedtime.   metoprolol tartrate (LOPRESSOR) 25 MG tablet TAKE 1/2 TABLET BY MOUTH 2 TIMES DAILY. PLEASE CALL FOR APPT WITH [name]   rosuvastatin (CRESTOR) 10 MG tablet Take 10 mg by mouth 2 (two) times a week. Pt takes one tablet on Monday and Friday.   tamsulosin (FLOMAX) 0.4 MG CAPS capsule Take 0.4 mg by mouth at bedtime.     Allergies:   Patient has no known allergies.   Social History   Socioeconomic History   Marital status: Divorced    Spouse name: Not on file   Number of children: Not on file   Years of education: Not on file   Highest education level: Not on file  Occupational History   Not on file  Tobacco Use   Smoking status: Never   Smokeless tobacco: Never   Vaping Use   Vaping Use: Never used  Substance and Sexual Activity   Alcohol use: Not Currently    Comment: seldom    Drug use: No   Sexual activity: Not on file  Other Topics Concern   Not on file  Social History Narrative   Not on file   Social Determinants of Health   Financial Resource Strain: Not on file  Food Insecurity: Not on file  Transportation Needs: Not on file  Physical Activity: Not on file  Stress: Not on file  Social Connections: Not on file     Family History: The patient's family history is not on file.  ROS:   Please see the history of present illness.      All other systems reviewed and are negative.  EKGs/Labs/Other Studies Reviewed:    The following studies were reviewed today: ECHO 03/09/2021: IMPRESSIONS  1. Left ventricular ejection fraction, by estimation, is 50 to 55%. Left  ventricular ejection fraction by 3D volume is 53 %. The left ventricle has  low normal function. The left ventricle has no regional wall motion  abnormalities. Left ventricular  diastolic function could not be evaluated.   2. Right ventricular systolic function is normal. The right ventricular  size is normal. There is normal pulmonary artery systolic pressure.   3. Left atrial size was moderately dilated.   4. The mitral valve is myxomatous. No evidence of mitral valve  regurgitation. No evidence of mitral stenosis. The mean mitral valve  gradient is 3.5 mmHg with average heart rate of 75 bpm. There is a 32 mm  Memo prosthetic annuloplasty ring present in the   mitral position. Procedure Date: 01/24/2021.   5. The aortic valve is tricuspid. Aortic valve regurgitation is trivial.  Mild aortic valve sclerosis is present, with no evidence of aortic valve  stenosis.   Comparison(s): Prior images reviewed side by side. There is interval  successful MV repair with slight reduction in LV EF.   ECHO 01/24/2021:   1. Left ventricular ejection fraction, by estimation,  is 50 to 55%. Left  ventricular ejection fraction by 3D volume is 53 %. The left ventricle has  low normal function. The left ventricle has no regional wall motion  abnormalities. Left ventricular  diastolic function could not be evaluated.   2. Right ventricular systolic function is normal. The right ventricular  size is normal. There is normal pulmonary artery systolic pressure.   3. Left atrial size was moderately dilated.   4. The mitral valve is myxomatous. No evidence of mitral valve  regurgitation. No evidence of mitral stenosis. The mean mitral valve  gradient is 3.5 mmHg with average heart rate of 75 bpm. There is a 32 mm  Memo prosthetic annuloplasty ring present in the   mitral position. Procedure Date: 01/24/2021.   5. The aortic valve is tricuspid. Aortic valve regurgitation is trivial.  Mild aortic valve sclerosis is present, with no evidence of aortic valve  stenosis.   Comparison(s): Prior images reviewed side by side. There is interval  successful MV repair with slight reduction in LV EF.  VAS Dopplar Pre CABG 01/22/2021:  Summary: Right Carotid: There was no evidence of thrombus, dissection, atherosclerotic                plaque or stenosis in the cervical carotid system.  Left Carotid: There was no evidence of thrombus, dissection, atherosclerotic               plaque or stenosis in the cervical carotid system. Vertebrals:  Bilateral vertebral arteries demonstrate antegrade flow. Subclavians: Normal flow hemodynamics were seen in bilateral subclavian              arteries.  Right ABI: Resting right ankle-brachial index is within normal range. No evidence of significant right lower extremity arterial disease. Left ABI: Resting left ankle-brachial index is within normal range. No evidence of significant left lower extremity arterial disease. Right Upper Extremity: Doppler waveforms remain within normal limits with right radial compression. Doppler waveforms  remain within normal limits with right ulnar compression. Left Upper Extremity: Doppler waveforms remain within normal limits with left radial compression. Doppler waveform obliterate with left ulnar compression.          EKG: EKG is personally reviewed.  10/02/2022: normal sinus rhythm, hr 75 bpm.   Recent Labs: 11/14/2021: BUN 27; Creatinine, Ser  0.81; Hemoglobin 13.8; Platelets 226; Potassium 4.0; Sodium 139  Recent Lipid Panel No results found for: "CHOL", "TRIG", "HDL", "CHOLHDL", "VLDL", "LDLCALC", "LDLDIRECT"   Risk Assessment/Calculations:          Physical Exam:    VS:  BP 100/60 (BP Location: Left Arm, Patient Position: Sitting, Cuff Size: Normal)   Pulse 75   Ht '5\' 10"'$  (1.778 m)   Wt 177 lb (80.3 kg)   BMI 25.40 kg/m     Wt Readings from Last 3 Encounters:  10/02/22 177 lb (80.3 kg)  11/27/21 181 lb 2 oz (82.2 kg)  11/14/21 181 lb 2 oz (82.2 kg)     GEN:  Well nourished, well developed in no acute distress HEENT: Normal NECK: No JVD; No carotid bruits LYMPHATICS: No lymphadenopathy CARDIAC: RRR, no murmurs, rubs, gallops RESPIRATORY:  Clear to auscultation without rales, wheezing or rhonchi  ABDOMEN: Soft, non-tender, non-distended MUSCULOSKELETAL:  No edema; No deformity  SKIN: Warm and dry NEUROLOGIC:  Alert and oriented x 3 PSYCHIATRIC:  Normal affect   ASSESSMENT:    1. S/P mitral valve repair   2. Mitral valve insufficiency, unspecified etiology     PLAN:    In order of problems listed above:  Mitral valve repair - Dr. Roxy Manns 2022.  Doing well.  Prior echocardiogram in 2022 showed no residual mitral regurgitation.  No stenosis.  Echocardiogram reviewed.  Dental prophylaxis. ASA 81 for medical mgt.   BPH/malignant neoplasm of prostate --[name].  He has had extensive radiation treatment.  He had problems of bladder outlet obstruction post mitral valve replacement.  Hyperlipidemia - LDL 130 07/2022.  He is taking Crestor 10 mg Mondays  and Fridays.    Follow Up: 1 year   Medication Adjustments/Labs and Tests Ordered: Current medicines are reviewed at length with the patient today.  Concerns regarding medicines are outlined above.  Orders Placed This Encounter  Procedures   EKG 12-Lead   ECHOCARDIOGRAM COMPLETE    No orders of the defined types were placed in this encounter.    Patient Instructions  Medication Instructions:  Your physician recommends that you continue on your current medications as directed. Please refer to the Current Medication list given to you today.  *If you need a refill on your cardiac medications before your next appointment, please call your pharmacy*  Lab Work: None. If you have labs (blood work) drawn today and your tests are completely normal, you will receive your results only by: [place] (if you have MyChart) OR A paper copy in the mail If you have any lab test that is abnormal or we need to change your treatment, we will call you to review the results.  Testing/Procedures: Your physician has requested that you have an echocardiogram in one year around October of 2024. Echocardiography is a painless test that uses sound waves to create images of your heart. It provides your doctor with information about the size and shape of your heart and how well your heart's chambers and valves are working. This procedure takes approximately one hour. There are no restrictions for this procedure. Please do NOT wear cologne, perfume, aftershave, or lotions (deodorant is allowed). Someone will call you to schedule this procedure.  Please arrive 15 minutes prior to your appointment time.  Follow-Up: At [facility], you and your health needs are our priority.  As part of our continuing mission to provide you with exceptional heart care, we have created designated Provider Care Teams.  These  Care Teams include your primary Cardiologist (physician) and Advanced Practice Providers  (APPs -  Physician Assistants and Nurse Practitioners) who all work together to provide you with the care you need, when you need it.  We recommend signing up for the patient portal called "MyChart".  Sign up information is provided on this After Visit Summary.  MyChart is used to connect with patients for Virtual Visits (Telemedicine).  Patients are able to view lab/test results, encounter notes, upcoming appointments, etc.  Non-urgent messages can be sent to your provider as well.   To learn more about what you can do with MyChart, go to [email or web address].    Your next appointment:   1 year(s)  The format for your next appointment:   In Person  Provider:   Candee Furbish, MD     Important Information About [place],acting as a scribe for Candee Furbish, MD.,have documented all relevant documentation on the behalf of Candee Furbish, MD,as directed by  Candee Furbish, MD while in the presence of Candee Furbish, MD.   I, Candee Furbish, MD, have reviewed all documentation for this visit. The documentation on 10/02/22 for the exam, diagnosis, procedures, and orders are all accurate and complete.   Signed, Candee Furbish, MD  10/02/2022 9:48 AM     [facility]

## 2022-10-02 NOTE — Telephone Encounter (Signed)
Pt c/o medication issue:  1. Name of Medication:  Ibuprofen '20mg'$  - 2 in AM, 2 in PM   glucosamine '1500mg'$  - 2x daily   2. How are you currently taking this medication (dosage and times per day)? See above   3. Are you having a reaction (difficulty breathing--STAT)?   4. What is your medication issue? Pt calling back after his appt with Dr. [name] and would like these medications added to his med list.

## 2022-10-02 NOTE — Telephone Encounter (Signed)
Verified dosages with patient over phone and updated medication list.

## 2022-10-02 NOTE — Telephone Encounter (Signed)
Patient called to add ibuprofen 400 mg BID and glucosamine 3000 mg BID to his medication list.

## 2022-10-02 NOTE — Patient Instructions (Addendum)
Medication Instructions:  Your physician recommends that you continue on your current medications as directed. Please refer to the Current Medication list given to you today.  *If you need a refill on your cardiac medications before your next appointment, please call your pharmacy*  Lab Work: None. If you have labs (blood work) drawn today and your tests are completely normal, you will receive your results only by: [place] (if you have MyChart) OR A paper copy in the mail If you have any lab test that is abnormal or we need to change your treatment, we will call you to review the results.  Testing/Procedures: Your physician has requested that you have an echocardiogram in one year around October of 2024. Echocardiography is a painless test that uses sound waves to create images of your heart. It provides your doctor with information about the size and shape of your heart and how well your heart's chambers and valves are working. This procedure takes approximately one hour. There are no restrictions for this procedure. Please do NOT wear cologne, perfume, aftershave, or lotions (deodorant is allowed). Someone will call you to schedule this procedure.  Please arrive 15 minutes prior to your appointment time.  Follow-Up: At [facility], you and your health needs are our priority.  As part of our continuing mission to provide you with exceptional heart care, we have created designated Provider Care Teams.  These Care Teams include your primary Cardiologist (physician) and Advanced Practice Providers (APPs -  Physician Assistants and Nurse Practitioners) who all work together to provide you with the care you need, when you need it.  We recommend signing up for the patient portal called "MyChart".  Sign up information is provided on this After Visit Summary.  MyChart is used to connect with patients for Virtual Visits (Telemedicine).  Patients are able to view lab/test results,  encounter notes, upcoming appointments, etc.  Non-urgent messages can be sent to your provider as well.   To learn more about what you can do with MyChart, go to [email or web address].    Your next appointment:   1 year(s)  The format for your next appointment:   In Person  Provider:   Candee Furbish, MD     Important Information About [place]

## 2022-10-19 DIAGNOSIS — M25561 Pain in right knee: Secondary | ICD-10-CM | POA: Diagnosis not present

## 2022-10-19 DIAGNOSIS — M25562 Pain in left knee: Secondary | ICD-10-CM | POA: Diagnosis not present

## 2022-10-23 DIAGNOSIS — N401 Enlarged prostate with lower urinary tract symptoms: Secondary | ICD-10-CM | POA: Diagnosis not present

## 2022-10-23 DIAGNOSIS — C61 Malignant neoplasm of prostate: Secondary | ICD-10-CM | POA: Diagnosis not present

## 2022-10-23 DIAGNOSIS — R350 Frequency of micturition: Secondary | ICD-10-CM | POA: Diagnosis not present

## 2022-11-14 DIAGNOSIS — X32XXXD Exposure to sunlight, subsequent encounter: Secondary | ICD-10-CM | POA: Diagnosis not present

## 2022-11-14 DIAGNOSIS — L570 Actinic keratosis: Secondary | ICD-10-CM | POA: Diagnosis not present

## 2022-11-15 ENCOUNTER — Other Ambulatory Visit: Payer: Self-pay | Admitting: *Deleted

## 2022-11-15 MED ORDER — METOPROLOL TARTRATE 25 MG PO TABS: ORAL_TABLET | ORAL | 1 refills | Status: DC

## 2022-11-28 DIAGNOSIS — K5730 Diverticulosis of large intestine without perforation or abscess without bleeding: Secondary | ICD-10-CM | POA: Diagnosis not present

## 2022-11-28 DIAGNOSIS — K648 Other hemorrhoids: Secondary | ICD-10-CM | POA: Diagnosis not present

## 2022-11-28 DIAGNOSIS — Z1211 Encounter for screening for malignant neoplasm of colon: Secondary | ICD-10-CM | POA: Diagnosis not present

## 2022-11-28 DIAGNOSIS — K635 Polyp of colon: Secondary | ICD-10-CM | POA: Diagnosis not present

## 2022-11-29 DIAGNOSIS — M25561 Pain in right knee: Secondary | ICD-10-CM | POA: Diagnosis not present

## 2022-11-29 DIAGNOSIS — M25562 Pain in left knee: Secondary | ICD-10-CM | POA: Diagnosis not present

## 2022-11-30 DIAGNOSIS — K635 Polyp of colon: Secondary | ICD-10-CM | POA: Diagnosis not present

## 2022-12-19 ENCOUNTER — Telehealth: Payer: Self-pay | Admitting: Cardiology

## 2022-12-19 MED ORDER — METOPROLOL TARTRATE 25 MG PO TABS: ORAL_TABLET | ORAL | 3 refills | Status: DC

## 2022-12-19 NOTE — Telephone Encounter (Signed)
Pt' medication was sent to pt's pharmacy as requested. Confirmation received.  

## 2022-12-19 NOTE — Telephone Encounter (Signed)
*  STAT* If patient is at the pharmacy, call can be transferred to refill team.   1. Which medications need to be refilled? (please list name of each medication and dose if known) Metoprolol  2. Which pharmacy/location (including street and city if local pharmacy) is medication to be sent to?   [facility] [place]  3. Do they need a 30 day or 90 day supply? 90 days and refills

## 2023-01-20 IMAGING — CT CT CTA ABD/PEL W/CM AND/OR W/O CM
2 of 7 series · 16 of 36 positions shown · IV contrast (iopamidol)
Comparison: None.

CLINICAL DATA: Mitral regurgitation, aortic dissection

EXAM:
CT ANGIOGRAPHY CHEST, ABDOMEN AND PELVIS
TECHNIQUE: Multidetector CT imaging through the chest, abdomen and pelvis was
performed using the standard protocol during bolus administration of
intravenous contrast. Multiplanar reconstructed images and MIPs were
obtained and reviewed to evaluate the vascular anatomy.
CONTRAST:  75mL Q4JICT-GSR IOPAMIDOL (Q4JICT-GSR) INJECTION 76%

[Series 4: cta cap aneurysm 2.00 bv36 s3 axial arterial · axial · arterial · 0.72mm/px · z∈[+1223,+1789]mm · 15 of 325 slices shown]
[im 21/325  lung]
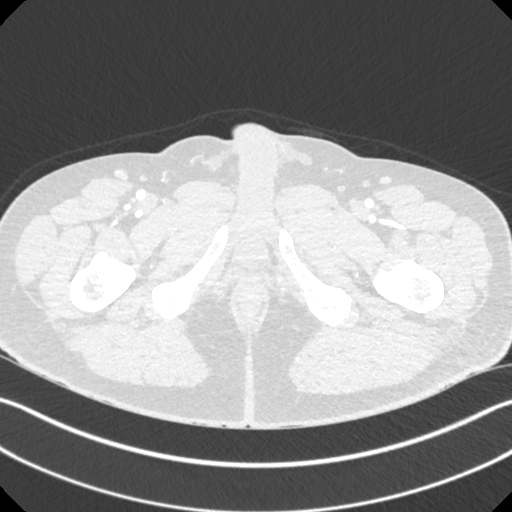
[im 41/325  mediastinal]
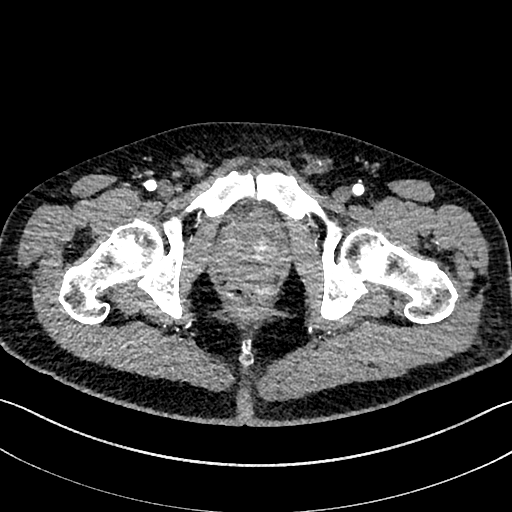
[im 61/325  lung]
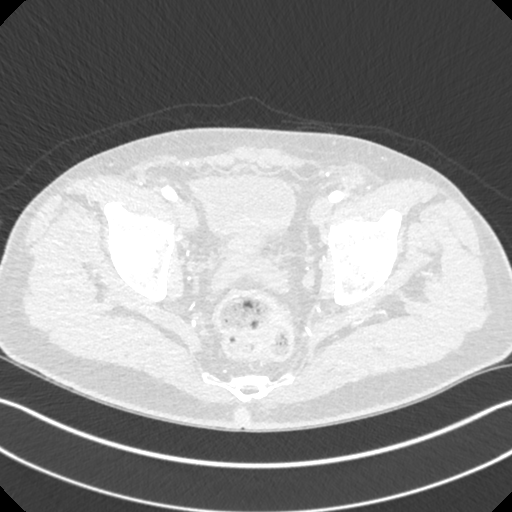
[im 82/325  mediastinal]
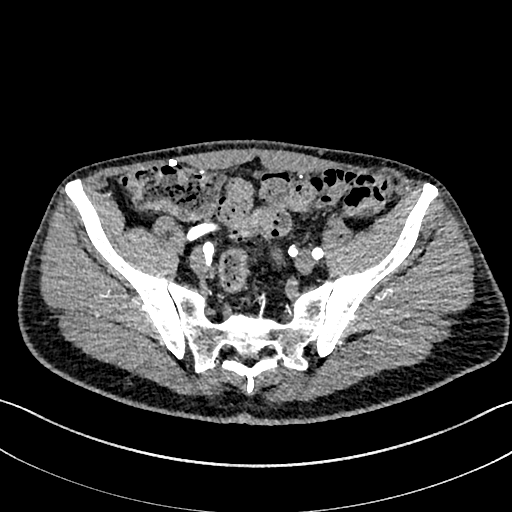
[im 102/325  lung]
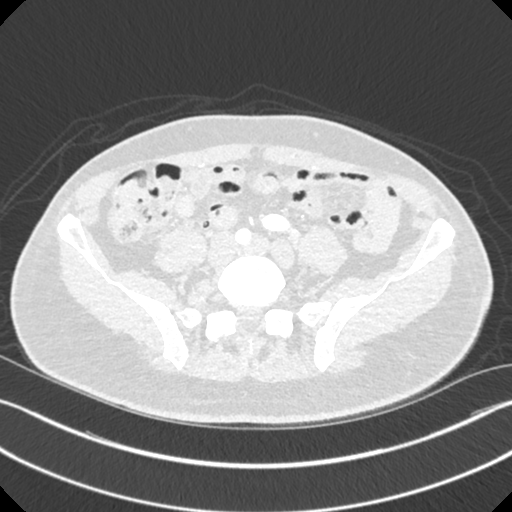
[im 122/325  mediastinal]
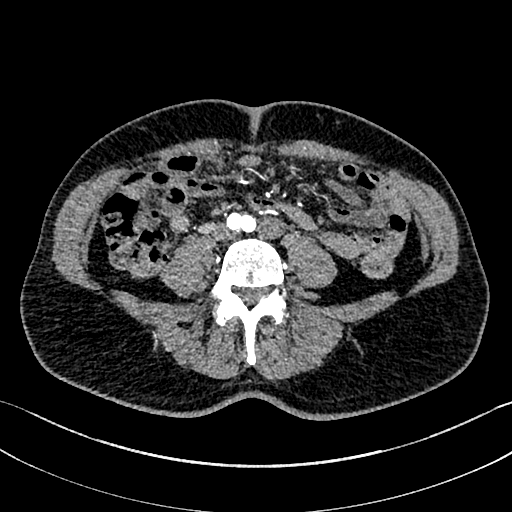
[im 142/325  lung]
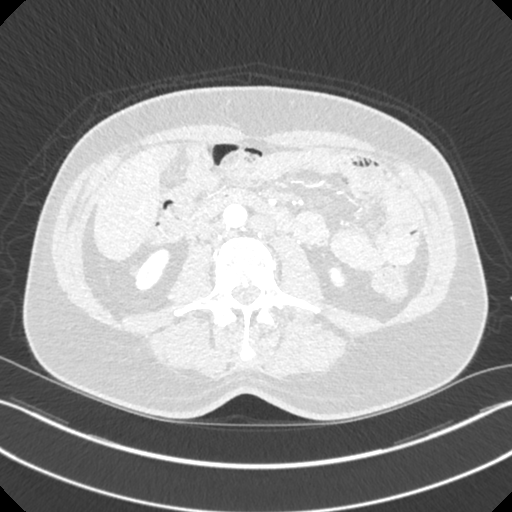
[im 163/325  mediastinal]
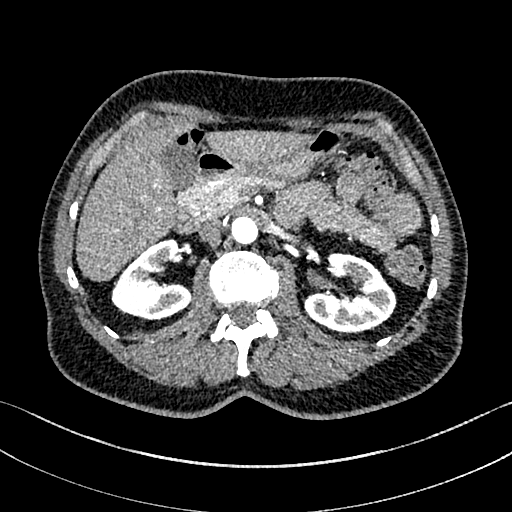
[im 183/325  lung]
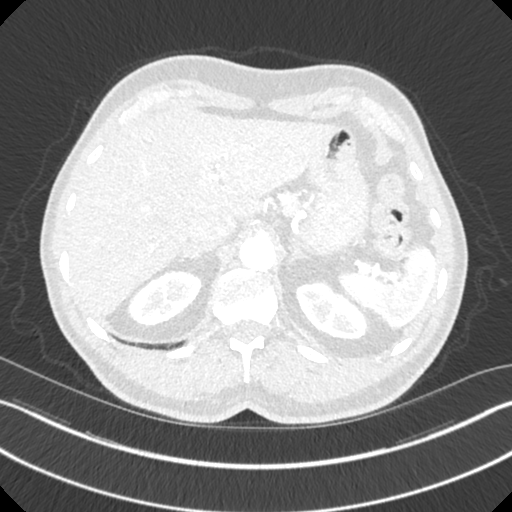
[im 203/325  mediastinal]
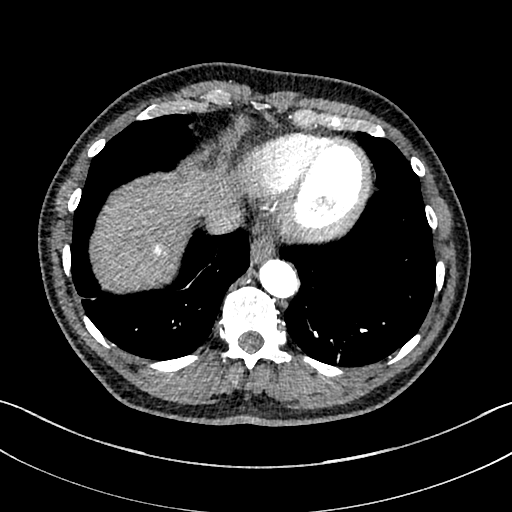
[im 223/325  lung]
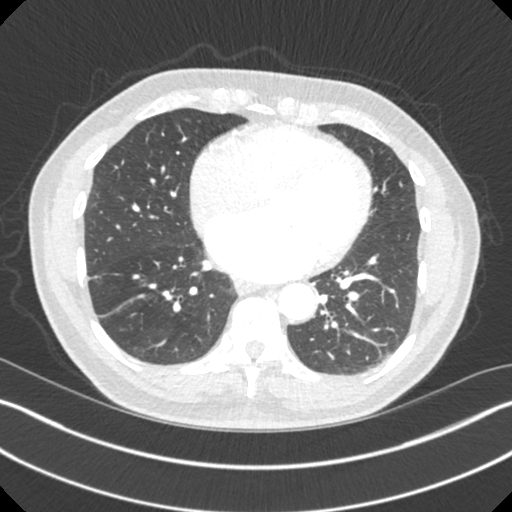
[im 244/325  mediastinal]
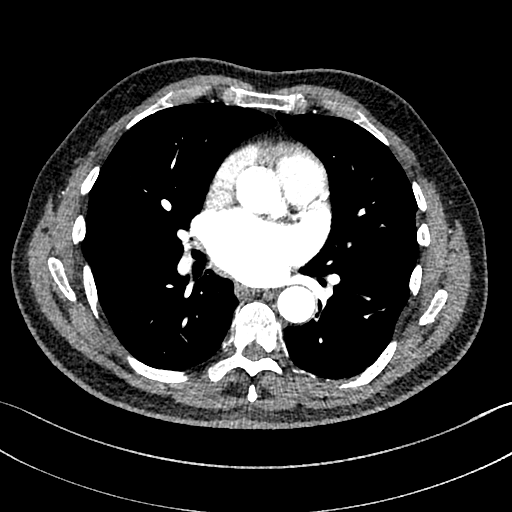
[im 264/325  lung]
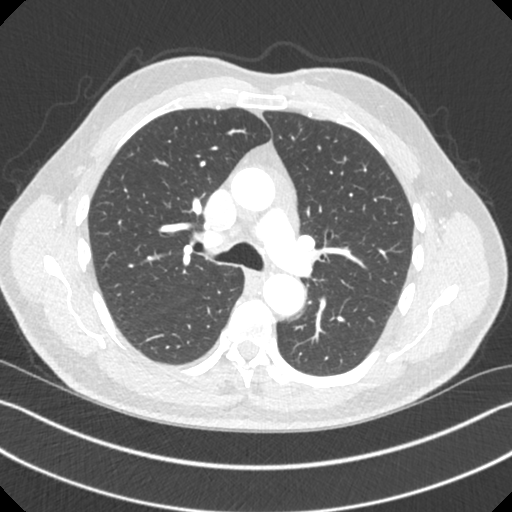
[im 284/325  mediastinal]
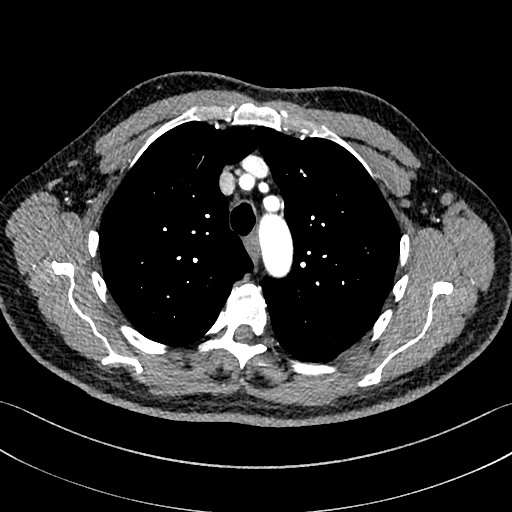
[im 304/325  lung]
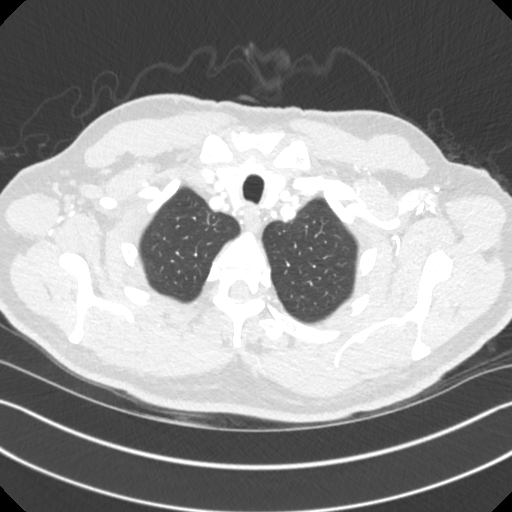

[Series 6: cta cap aneurysm 2.00 bv36 s3 cor st · coronal · 0.72mm/px · 1 of 143 slices shown]
[im 72/143  mediastinal]
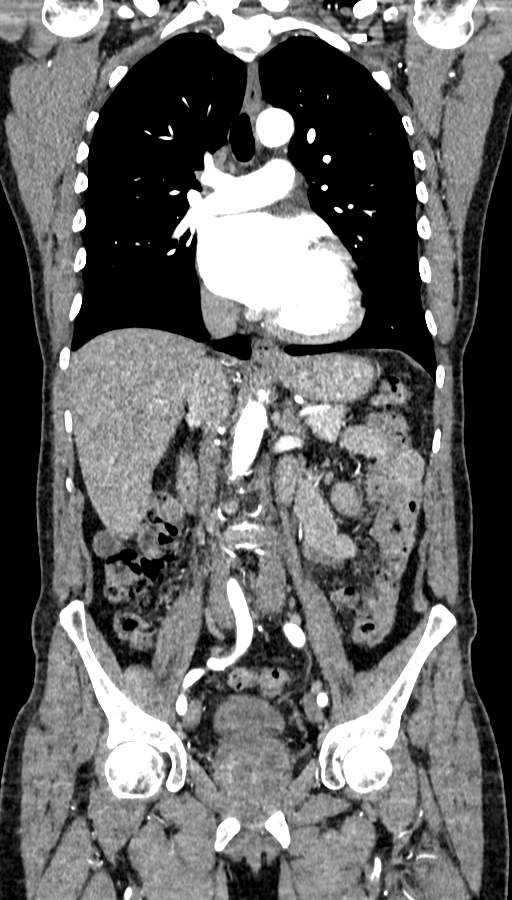

[16 of 36 positions shown; findings below may reference images not displayed]

FINDINGS: CTA CHEST FINDINGS

Cardiovascular: The thoracic aorta is of normal caliber. No
intramural hematoma, aneurysm, or dissection. No significant
atherosclerotic calcification. Arch vasculature demonstrates normal
anatomic configuration and is widely patent proximally.

No significant coronary artery calcification. Global cardiac size
within normal limits. No pericardial effusion. Central pulmonary
arteries are of normal caliber.

Mediastinum/Nodes: No enlarged mediastinal, hilar, or axillary lymph
nodes. Thyroid gland, trachea, and esophagus demonstrate no
significant findings.

Lungs/Pleura: Mild bibasilar atelectasis. The lungs are otherwise
clear. No focal pulmonary nodule or infiltrate. No pneumothorax or
pleural effusion. The central airways are widely patent.

Musculoskeletal: No acute bone abnormality. No suspicious lytic or
blastic bone lesion. Probable bone island within the posterior
aspect of the T7 vertebral body.

Review of the MIP images confirms the above findings.

CTA ABDOMEN AND PELVIS FINDINGS

VASCULAR

Aorta: Normal caliber aorta without aneurysm, dissection, vasculitis
or significant stenosis.

Celiac: Less than 50% stenosis at its origin secondary to extrinsic
compression. Otherwise widely patent. Normal anatomic configuration.
No significant atherosclerotic disease.

SMA: Widely patent.

Renals: Dual right and single left renal arteries demonstrate wide
patency, normal vascular morphology, and no aneurysm.

IMA: Widely patent

Inflow: Minimal atherosclerotic calcification within the left common
iliac artery. Widely patent without hemodynamically significant
stenosis, aneurysm, or dissection.

Veins: Not well opacified. Duplicated inferior vena cava noted, a
normal anatomic variant.

Review of the MIP images confirms the above findings.

NON-VASCULAR

Hepatobiliary: No focal liver abnormality is seen. No gallstones,
gallbladder wall thickening, or biliary dilatation.

Pancreas: Unremarkable

Spleen: Unremarkable

Adrenals/Urinary Tract: The adrenal glands are unremarkable. The
kidneys are normal. The bladder is largely decompressed. There is,
however, mild circumferential bladder wall thickening suggesting
changes of at least mild chronic bladder outlet obstruction.

Stomach/Bowel: Stomach is within normal limits. Appendix appears
normal. No evidence of bowel wall thickening, distention, or
inflammatory changes. No free intraperitoneal gas or fluid.

Lymphatic: No pathologic adenopathy within the abdomen and pelvis.

Reproductive: Marked prostatic enlargement with an estimated volume
of 93 cc. Seminal vesicles are unremarkable.

Other: Bilateral inguinal hernia repair with mesh has been
performed. Rectum unremarkable.

Musculoskeletal: No acute bone abnormality within the abdomen and
pelvis. No lytic or blastic bone lesion identified.

Review of the MIP images confirms the above findings.
IMPRESSION: No evidence of thoracoabdominal aortic aneurysm or dissection.
Minimal atherosclerotic disease.

Duplicated inferior vena cava, a normal anatomic variant.

Marked prostatic enlargement. Mild bladder wall thickening suggest
changes of at least mild chronic bladder outlet obstruction. The
bladder, however, is decompressed.

## 2023-01-20 IMAGING — CT CT ANGIO CHEST
2 of 7 series · 16 of 36 positions shown · IV contrast (iopamidol)
Comparison: None.

CLINICAL DATA: Mitral regurgitation, aortic dissection

EXAM:
CT ANGIOGRAPHY CHEST, ABDOMEN AND PELVIS
TECHNIQUE: Multidetector CT imaging through the chest, abdomen and pelvis was
performed using the standard protocol during bolus administration of
intravenous contrast. Multiplanar reconstructed images and MIPs were
obtained and reviewed to evaluate the vascular anatomy.
CONTRAST:  75mL Q4JICT-GSR IOPAMIDOL (Q4JICT-GSR) INJECTION 76%

[Series 4: cta cap aneurysm 2.00 bv36 s3 axial arterial · axial · arterial · 0.72mm/px · z∈[+1223,+1789]mm · 15 of 325 slices shown]
[im 21/325  lung]
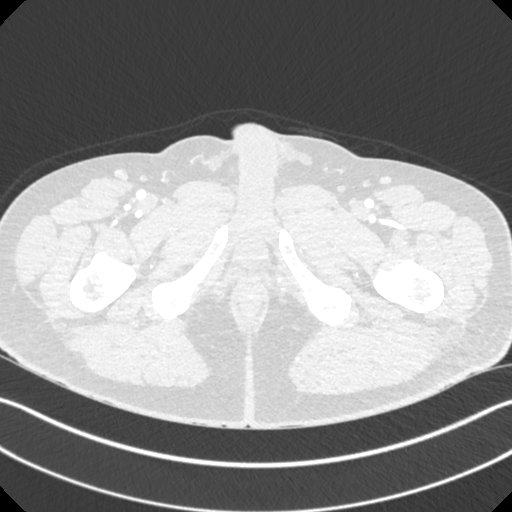
[im 41/325  mediastinal]
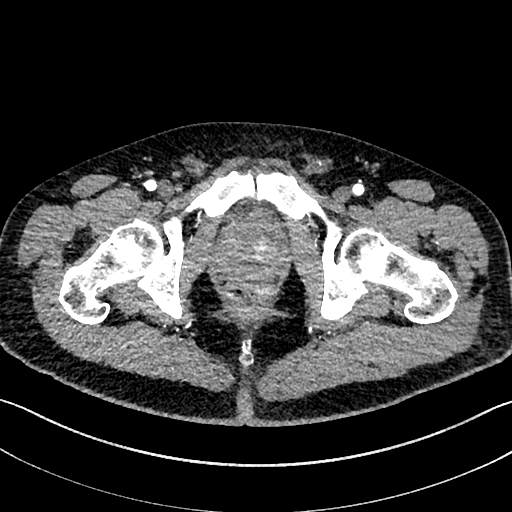
[im 61/325  lung]
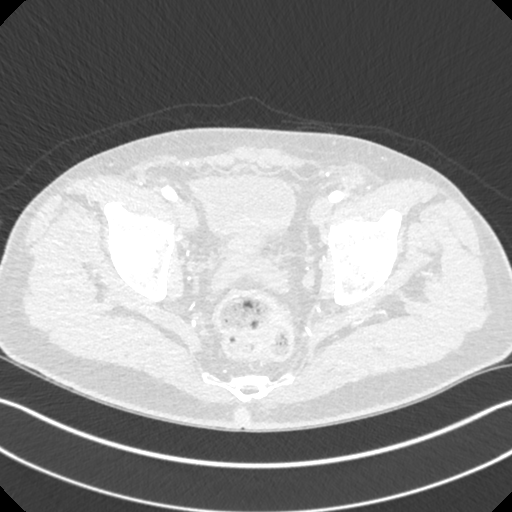
[im 82/325  mediastinal]
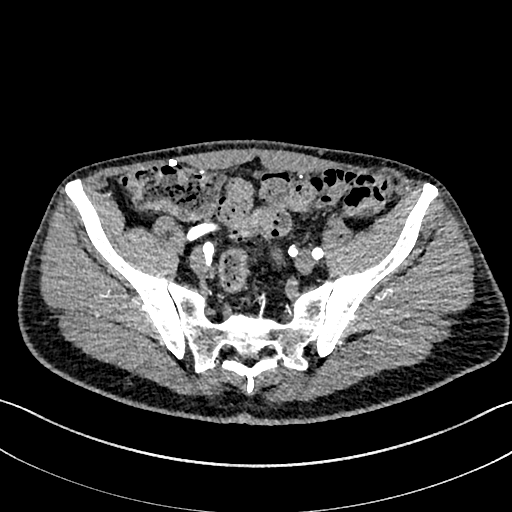
[im 102/325  lung]
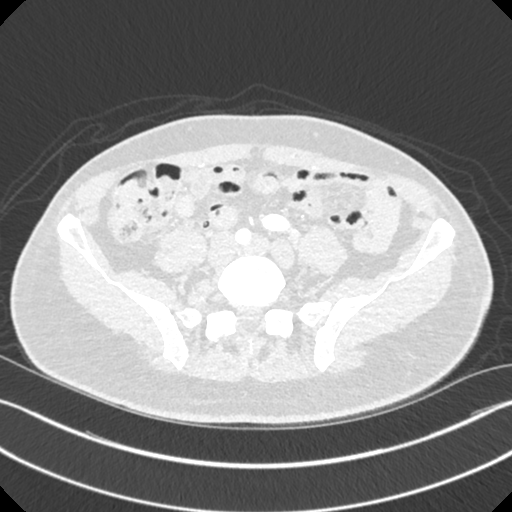
[im 122/325  mediastinal]
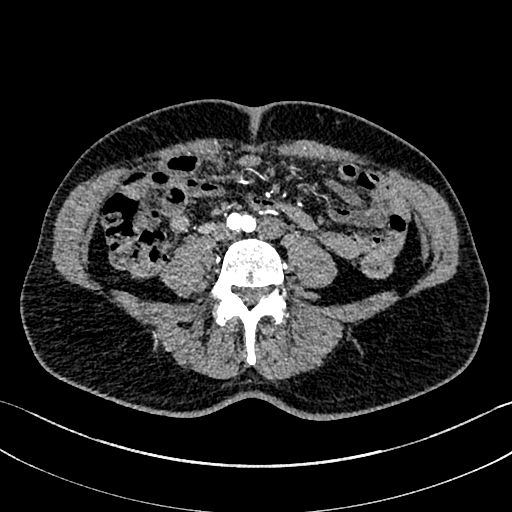
[im 142/325  lung]
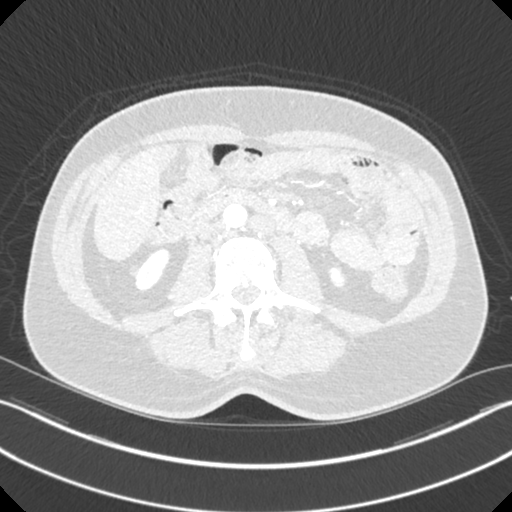
[im 163/325  mediastinal]
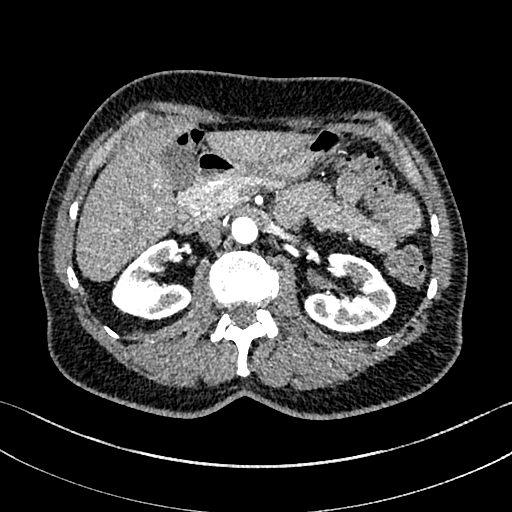
[im 183/325  lung]
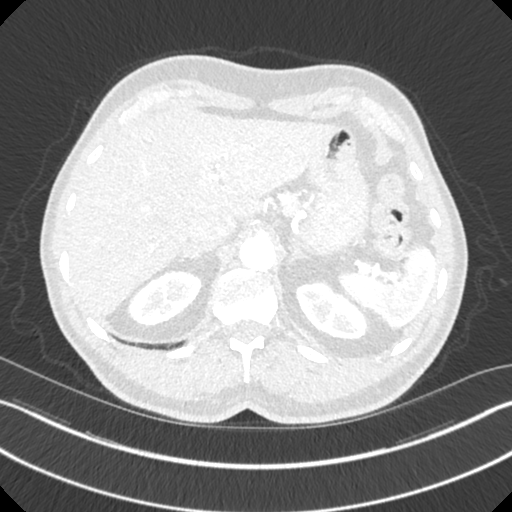
[im 203/325  mediastinal]
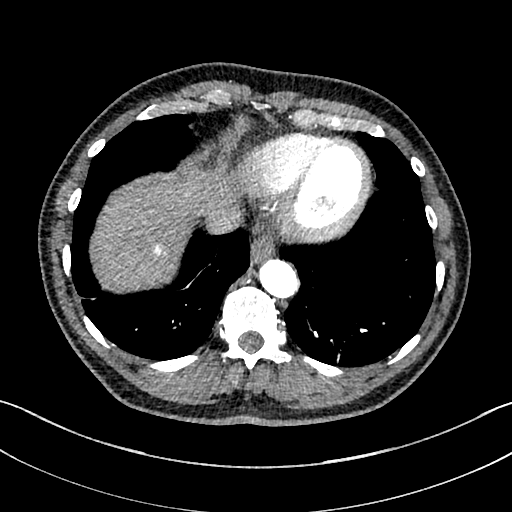
[im 223/325  lung]
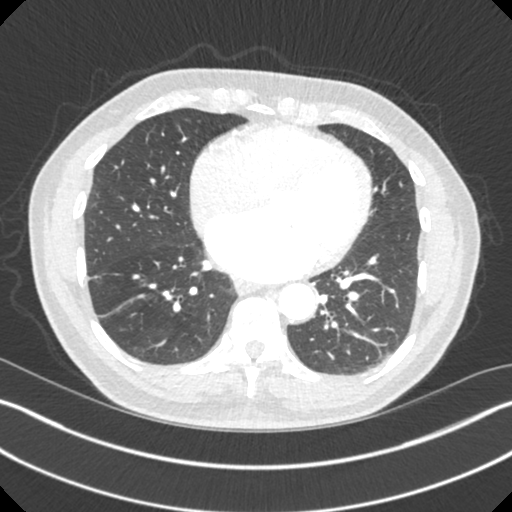
[im 244/325  mediastinal]
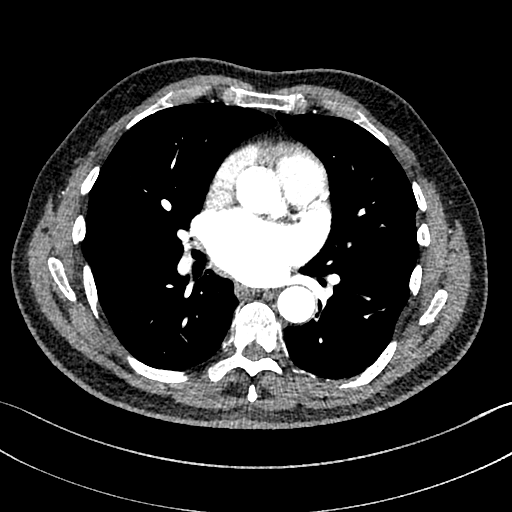
[im 264/325  lung]
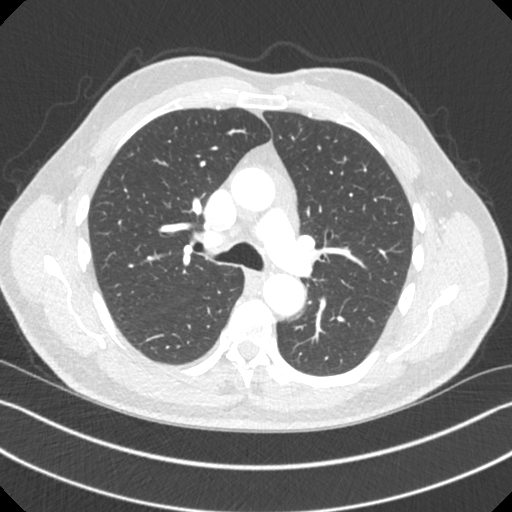
[im 284/325  mediastinal]
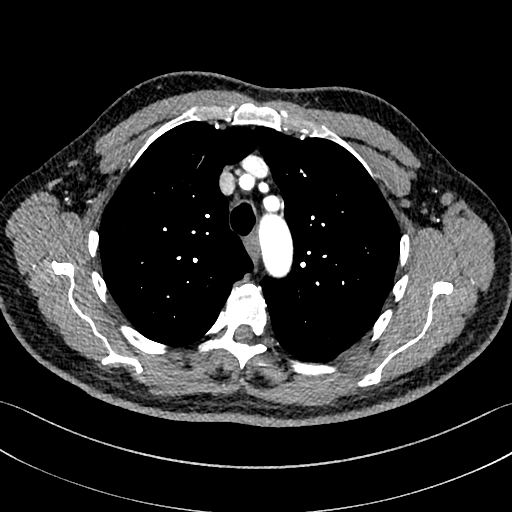
[im 304/325  lung]
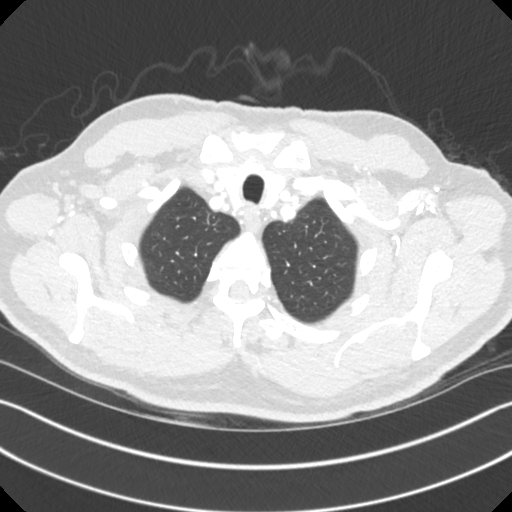

[Series 6: cta cap aneurysm 2.00 bv36 s3 cor st · coronal · 0.72mm/px · 1 of 143 slices shown]
[im 72/143  mediastinal]
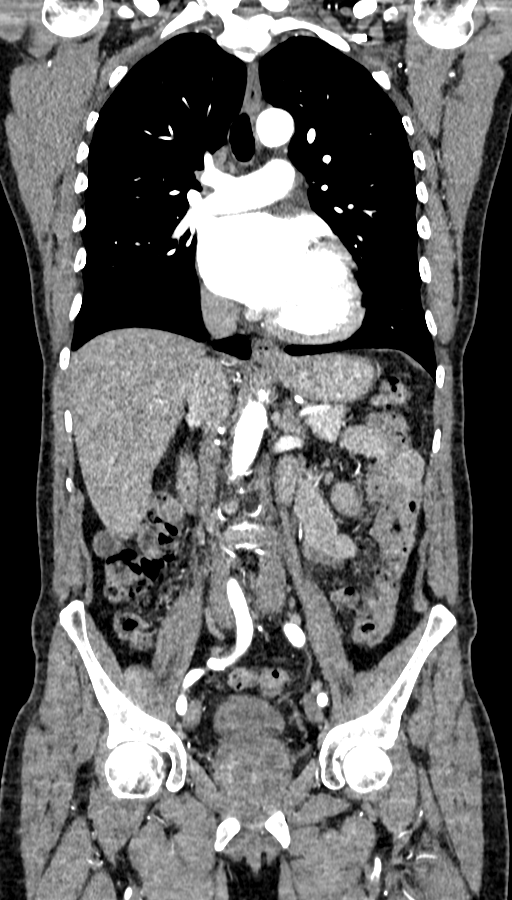

[16 of 36 positions shown; findings below may reference images not displayed]

FINDINGS: CTA CHEST FINDINGS

Cardiovascular: The thoracic aorta is of normal caliber. No
intramural hematoma, aneurysm, or dissection. No significant
atherosclerotic calcification. Arch vasculature demonstrates normal
anatomic configuration and is widely patent proximally.

No significant coronary artery calcification. Global cardiac size
within normal limits. No pericardial effusion. Central pulmonary
arteries are of normal caliber.

Mediastinum/Nodes: No enlarged mediastinal, hilar, or axillary lymph
nodes. Thyroid gland, trachea, and esophagus demonstrate no
significant findings.

Lungs/Pleura: Mild bibasilar atelectasis. The lungs are otherwise
clear. No focal pulmonary nodule or infiltrate. No pneumothorax or
pleural effusion. The central airways are widely patent.

Musculoskeletal: No acute bone abnormality. No suspicious lytic or
blastic bone lesion. Probable bone island within the posterior
aspect of the T7 vertebral body.

Review of the MIP images confirms the above findings.

CTA ABDOMEN AND PELVIS FINDINGS

VASCULAR

Aorta: Normal caliber aorta without aneurysm, dissection, vasculitis
or significant stenosis.

Celiac: Less than 50% stenosis at its origin secondary to extrinsic
compression. Otherwise widely patent. Normal anatomic configuration.
No significant atherosclerotic disease.

SMA: Widely patent.

Renals: Dual right and single left renal arteries demonstrate wide
patency, normal vascular morphology, and no aneurysm.

IMA: Widely patent

Inflow: Minimal atherosclerotic calcification within the left common
iliac artery. Widely patent without hemodynamically significant
stenosis, aneurysm, or dissection.

Veins: Not well opacified. Duplicated inferior vena cava noted, a
normal anatomic variant.

Review of the MIP images confirms the above findings.

NON-VASCULAR

Hepatobiliary: No focal liver abnormality is seen. No gallstones,
gallbladder wall thickening, or biliary dilatation.

Pancreas: Unremarkable

Spleen: Unremarkable

Adrenals/Urinary Tract: The adrenal glands are unremarkable. The
kidneys are normal. The bladder is largely decompressed. There is,
however, mild circumferential bladder wall thickening suggesting
changes of at least mild chronic bladder outlet obstruction.

Stomach/Bowel: Stomach is within normal limits. Appendix appears
normal. No evidence of bowel wall thickening, distention, or
inflammatory changes. No free intraperitoneal gas or fluid.

Lymphatic: No pathologic adenopathy within the abdomen and pelvis.

Reproductive: Marked prostatic enlargement with an estimated volume
of 93 cc. Seminal vesicles are unremarkable.

Other: Bilateral inguinal hernia repair with mesh has been
performed. Rectum unremarkable.

Musculoskeletal: No acute bone abnormality within the abdomen and
pelvis. No lytic or blastic bone lesion identified.

Review of the MIP images confirms the above findings.
IMPRESSION: No evidence of thoracoabdominal aortic aneurysm or dissection.
Minimal atherosclerotic disease.

Duplicated inferior vena cava, a normal anatomic variant.

Marked prostatic enlargement. Mild bladder wall thickening suggest
changes of at least mild chronic bladder outlet obstruction. The
bladder, however, is decompressed.

## 2023-04-19 DIAGNOSIS — C61 Malignant neoplasm of prostate: Secondary | ICD-10-CM | POA: Diagnosis not present

## 2023-04-23 DIAGNOSIS — L820 Inflamed seborrheic keratosis: Secondary | ICD-10-CM | POA: Diagnosis not present

## 2023-04-23 DIAGNOSIS — L720 Epidermal cyst: Secondary | ICD-10-CM | POA: Diagnosis not present

## 2023-04-26 DIAGNOSIS — N401 Enlarged prostate with lower urinary tract symptoms: Secondary | ICD-10-CM | POA: Diagnosis not present

## 2023-04-26 DIAGNOSIS — C61 Malignant neoplasm of prostate: Secondary | ICD-10-CM | POA: Diagnosis not present

## 2023-04-26 DIAGNOSIS — R3914 Feeling of incomplete bladder emptying: Secondary | ICD-10-CM | POA: Diagnosis not present

## 2023-07-26 DIAGNOSIS — D7589 Other specified diseases of blood and blood-forming organs: Secondary | ICD-10-CM | POA: Diagnosis not present

## 2023-07-26 DIAGNOSIS — Z79899 Other long term (current) drug therapy: Secondary | ICD-10-CM | POA: Diagnosis not present

## 2023-07-26 DIAGNOSIS — I341 Nonrheumatic mitral (valve) prolapse: Secondary | ICD-10-CM | POA: Diagnosis not present

## 2023-07-26 DIAGNOSIS — E785 Hyperlipidemia, unspecified: Secondary | ICD-10-CM | POA: Diagnosis not present

## 2023-07-26 DIAGNOSIS — Z0000 Encounter for general adult medical examination without abnormal findings: Secondary | ICD-10-CM | POA: Diagnosis not present

## 2023-07-26 DIAGNOSIS — C61 Malignant neoplasm of prostate: Secondary | ICD-10-CM | POA: Diagnosis not present

## 2023-09-10 DIAGNOSIS — R8271 Bacteriuria: Secondary | ICD-10-CM | POA: Diagnosis not present

## 2023-09-10 DIAGNOSIS — R3915 Urgency of urination: Secondary | ICD-10-CM | POA: Diagnosis not present

## 2023-09-10 DIAGNOSIS — R3916 Straining to void: Secondary | ICD-10-CM | POA: Diagnosis not present

## 2023-09-10 DIAGNOSIS — R350 Frequency of micturition: Secondary | ICD-10-CM | POA: Diagnosis not present

## 2023-09-24 DIAGNOSIS — M2141 Flat foot [pes planus] (acquired), right foot: Secondary | ICD-10-CM | POA: Diagnosis not present

## 2023-09-24 DIAGNOSIS — M2142 Flat foot [pes planus] (acquired), left foot: Secondary | ICD-10-CM | POA: Diagnosis not present

## 2023-09-27 ENCOUNTER — Ambulatory Visit (HOSPITAL_COMMUNITY): Payer: PPO | Attending: Cardiology

## 2023-09-27 DIAGNOSIS — Z9889 Other specified postprocedural states: Secondary | ICD-10-CM

## 2023-09-27 DIAGNOSIS — I340 Nonrheumatic mitral (valve) insufficiency: Secondary | ICD-10-CM

## 2023-09-27 LAB — ECHOCARDIOGRAM COMPLETE
Area-P 1/2: 1.97 cm2
MV M vel: 4.93 m/s
MV Peak grad: 97.2 mm[Hg]
MV VTI: 0.83 cm2
S' Lateral: 3.1 cm

## 2023-10-17 DIAGNOSIS — M2142 Flat foot [pes planus] (acquired), left foot: Secondary | ICD-10-CM | POA: Diagnosis not present

## 2023-10-17 DIAGNOSIS — M2141 Flat foot [pes planus] (acquired), right foot: Secondary | ICD-10-CM | POA: Diagnosis not present

## 2023-10-23 DIAGNOSIS — R3915 Urgency of urination: Secondary | ICD-10-CM | POA: Diagnosis not present

## 2023-10-23 DIAGNOSIS — R350 Frequency of micturition: Secondary | ICD-10-CM | POA: Diagnosis not present

## 2023-10-29 ENCOUNTER — Ambulatory Visit: Payer: PPO | Attending: Cardiology | Admitting: Cardiology

## 2023-10-29 ENCOUNTER — Encounter: Payer: Self-pay | Admitting: Cardiology

## 2023-10-29 VITALS — BP 124/72 | HR 72 | Ht 70.0 in | Wt 180.0 lb

## 2023-10-29 DIAGNOSIS — I340 Nonrheumatic mitral (valve) insufficiency: Secondary | ICD-10-CM

## 2023-10-29 DIAGNOSIS — Z9889 Other specified postprocedural states: Secondary | ICD-10-CM | POA: Diagnosis not present

## 2023-10-29 DIAGNOSIS — C61 Malignant neoplasm of prostate: Secondary | ICD-10-CM | POA: Diagnosis not present

## 2023-10-29 NOTE — Progress Notes (Signed)
Cardiology Office Note:  .   Date:  10/29/2023  ID:  Thomas [name], DOB January 08, 1946, MRN [id number] PCP: Henrine Screws, MD  [facility] Providers Cardiologist:  Donato Schultz, MD     History of Present Illness: .   Thomas [name] is a 77 y.o. male Discussed with the use of AI scribe   History of Present Illness   The patient, a 77 year old individual with a history of mitral valve repair, benign prostatic hyperplasia (BPH), and prostate cancer, presents for a follow-up visit. The mitral valve repair was performed minimally invasively on January 24, 2021, for severe asymptomatic primary mitral regurgitation. The patient is currently on Crestor 10 mg twice a week. The prostate cancer is being managed by Dr. Laverle Patter with radiation treatment. The patient also reports issues with bladder outlet obstruction.  An echocardiogram performed on September 27, 2023, showed a mitral valve repair with a mean gradient of 4 mmHg and mild residual mitral regurgitation. The ejection fraction was 65%. The patient reports feeling well overall, with no chest pain or shortness of breath. 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  The patient's prostate cancer has been stable, with a PSA level below one after radiation treatment. The patient denied experiencing any symptoms of angina.          Studies Reviewed: Marland Kitchen   EKG Interpretation Date/Time:  Tuesday October 29 2023 09:42:47 EST Ventricular Rate:  72 PR Interval:  144 QRS Duration:  82 QT Interval:  374 QTC Calculation: 409 R Axis:   87  Text Interpretation: Normal sinus rhythm Low voltage QRS When compared with ECG of 25-Jan-2021 07:00, Questionable change in QRS axis QT has shortened Confirmed by Donato Schultz ([id number]) on 10/29/2023 9:51:46 AM    Results   LABS PSA: <1  DIAGNOSTIC Echocardiogram: Mitral valve repair with mean gradient of 4 mmHg, mild mitral  regurgitation residual, ejection fraction 65%, 32 mm annuloplasty ring (09/27/2023)     Risk Assessment/Calculations:            Physical Exam:   VS:  BP 124/72   Pulse 72   Ht 5\' 10"  (1.778 m)   Wt 180 lb (81.6 kg)   SpO2 97%   BMI 25.83 kg/m    Wt Readings from Last 3 Encounters:  10/29/23 180 lb (81.6 kg)  10/02/22 177 lb (80.3 kg)  11/27/21 181 lb 2 oz (82.2 kg)    GEN: Well nourished, well developed in no acute distress NECK: No JVD; No carotid bruits CARDIAC: RRR, no murmurs, no rubs, no gallops RESPIRATORY:  Clear to auscultation without rales, wheezing or rhonchi  ABDOMEN: Soft, non-tender, non-distended EXTREMITIES:  No edema; No deformity   ASSESSMENT AND PLAN: .    Assessment and Plan    Status Post Mitral Valve Repair Status post minimally invasive mitral valve repair on January 24, 2021, for severe asymptomatic primary mitral regurgitation. Echocardiogram on September 27, 2023, shows a mean gradient of 4 mmHg and mild residual mitral regurgitation. Ejection fraction is 65%. Asymptomatic with no chest pain or dyspnea. The repair is stable with minimal valve tightness and good pump function. Discussed that the repair should last a lifetime with very rare complications. - Follow up with echocardiogram in one year - Schedule follow-up appointment with Julian Hy, PA, in one year  Benign Prostatic Hyperplasia (BPH) and Prostate Cancer BPH and prostate cancer, managed by Dr. Laverle Patter. Post-radiation with PSA  level below 1. Upcoming follow-up with Dr. Laverle Patter in January to monitor PSA levels. - Continue follow-up with Dr. Laverle Patter for prostate cancer management - Monitor PSA levels at the next appointment  General Health Maintenance Generally in good health, managing well with no new complaints or symptoms. Orthopedic issues with ankles resolved. - Continue current medications, including Crestor 10 mg on Mondays and Fridays - Maintain regular follow-up appointments and reach out  if any new symptoms arise.              Signed, Donato Schultz, MD

## 2023-10-29 NOTE — Patient Instructions (Signed)
Medication Instructions:  The current medical regimen is effective;  continue present plan and medications.  *If you need a refill on your cardiac medications before your next appointment, please call your pharmacy*  Testing/Procedures: Your physician has requested that you have an echocardiogram in 1 yr. Echocardiography is a painless test that uses sound waves to create images of your heart. It provides your doctor with information about the size and shape of your heart and how well your heart's chambers and valves are working. This procedure takes approximately one hour. There are no restrictions for this procedure. Please do NOT wear cologne, perfume, aftershave, or lotions (deodorant is allowed). Please arrive 15 minutes prior to your appointment time.  Please note: We ask at that you not bring children with you during ultrasound (echo/ vascular) testing. Due to room size and safety concerns, children are not allowed in the ultrasound rooms during exams. Our front office staff cannot provide observation of children in our lobby area while testing is being conducted. An adult accompanying a patient to their appointment will only be allowed in the ultrasound room at the discretion of the ultrasound technician under special circumstances. We apologize for any inconvenience.   Follow-Up: At [facility], you and your health needs are our priority.  As part of our continuing mission to provide you with exceptional heart care, we have created designated Provider Care Teams.  These Care Teams include your primary Cardiologist (physician) and Advanced Practice Providers (APPs -  Physician Assistants and Nurse Practitioners) who all work together to provide you with the care you need, when you need it.  We recommend signing up for the patient portal called "MyChart".  Sign up information is provided on this After Visit Summary.  MyChart is used to connect with patients for Virtual Visits  (Telemedicine).  Patients are able to view lab/test results, encounter notes, upcoming appointments, etc.  Non-urgent messages can be sent to your provider as well.   To learn more about what you can do with MyChart, go to [email or web address].    Your next appointment:   1 year(s)  Provider:   Jari Favre, PA-C

## 2023-11-18 ENCOUNTER — Telehealth: Payer: Self-pay | Admitting: Cardiology

## 2023-11-18 NOTE — Telephone Encounter (Signed)
 Meloxicam can increase BP and increase bleeding risk. Recommend taking for the shortest duration of time possible

## 2023-11-18 NOTE — Telephone Encounter (Signed)
 Calling to see if it okay for him to take meloxicam 7.5mg . Please advise

## 2023-11-26 NOTE — Telephone Encounter (Signed)
Call to patient to advise that pharmacist states that in general, meloxicam can be safe for a short duration but long term there are risks of elevated BP and increased bleeding. Patient states that he had read these things and had already stopped taking meloxicam. He states he would like to reach out to PCP to get further work up for the arthritis in his foot as he feels he needs longer term management for his arthritis. He says he has tried steroid injections with mixed results and would be willing to try that again as an alternative to meloxicam if it's safer for him. Advised patient to call us if he has any further questions about medications his PCP recommends to treat his foot arthritis.

## 2024-01-13 DIAGNOSIS — M2142 Flat foot [pes planus] (acquired), left foot: Secondary | ICD-10-CM | POA: Diagnosis not present

## 2024-01-13 DIAGNOSIS — M2141 Flat foot [pes planus] (acquired), right foot: Secondary | ICD-10-CM | POA: Diagnosis not present

## 2024-01-13 DIAGNOSIS — Z9889 Other specified postprocedural states: Secondary | ICD-10-CM | POA: Diagnosis not present

## 2024-01-16 DIAGNOSIS — M2142 Flat foot [pes planus] (acquired), left foot: Secondary | ICD-10-CM | POA: Diagnosis not present

## 2024-01-16 DIAGNOSIS — M2141 Flat foot [pes planus] (acquired), right foot: Secondary | ICD-10-CM | POA: Diagnosis not present

## 2024-02-12 DIAGNOSIS — X32XXXD Exposure to sunlight, subsequent encounter: Secondary | ICD-10-CM | POA: Diagnosis not present

## 2024-02-12 DIAGNOSIS — L570 Actinic keratosis: Secondary | ICD-10-CM | POA: Diagnosis not present

## 2024-03-04 ENCOUNTER — Other Ambulatory Visit: Payer: Self-pay | Admitting: Cardiology

## 2024-06-03 DIAGNOSIS — R3914 Feeling of incomplete bladder emptying: Secondary | ICD-10-CM | POA: Diagnosis not present

## 2024-06-03 DIAGNOSIS — N401 Enlarged prostate with lower urinary tract symptoms: Secondary | ICD-10-CM | POA: Diagnosis not present

## 2024-06-03 DIAGNOSIS — C61 Malignant neoplasm of prostate: Secondary | ICD-10-CM | POA: Diagnosis not present

## 2024-06-23 DIAGNOSIS — X32XXXD Exposure to sunlight, subsequent encounter: Secondary | ICD-10-CM | POA: Diagnosis not present

## 2024-06-23 DIAGNOSIS — L570 Actinic keratosis: Secondary | ICD-10-CM | POA: Diagnosis not present

## 2024-07-20 DIAGNOSIS — R310 Gross hematuria: Secondary | ICD-10-CM | POA: Diagnosis not present

## 2024-07-20 DIAGNOSIS — R350 Frequency of micturition: Secondary | ICD-10-CM | POA: Diagnosis not present

## 2024-07-20 DIAGNOSIS — C61 Malignant neoplasm of prostate: Secondary | ICD-10-CM | POA: Diagnosis not present

## 2024-07-20 DIAGNOSIS — N401 Enlarged prostate with lower urinary tract symptoms: Secondary | ICD-10-CM | POA: Diagnosis not present

## 2024-07-28 DIAGNOSIS — D7589 Other specified diseases of blood and blood-forming organs: Secondary | ICD-10-CM | POA: Diagnosis not present

## 2024-07-28 DIAGNOSIS — E785 Hyperlipidemia, unspecified: Secondary | ICD-10-CM | POA: Diagnosis not present

## 2024-07-28 DIAGNOSIS — C61 Malignant neoplasm of prostate: Secondary | ICD-10-CM | POA: Diagnosis not present

## 2024-07-28 DIAGNOSIS — Z0000 Encounter for general adult medical examination without abnormal findings: Secondary | ICD-10-CM | POA: Diagnosis not present

## 2024-07-28 DIAGNOSIS — I341 Nonrheumatic mitral (valve) prolapse: Secondary | ICD-10-CM | POA: Diagnosis not present

## 2024-07-28 DIAGNOSIS — Z23 Encounter for immunization: Secondary | ICD-10-CM | POA: Diagnosis not present

## 2024-07-28 DIAGNOSIS — Z1331 Encounter for screening for depression: Secondary | ICD-10-CM | POA: Diagnosis not present

## 2024-08-06 DIAGNOSIS — R310 Gross hematuria: Secondary | ICD-10-CM | POA: Diagnosis not present

## 2024-08-06 DIAGNOSIS — K449 Diaphragmatic hernia without obstruction or gangrene: Secondary | ICD-10-CM | POA: Diagnosis not present

## 2024-08-06 DIAGNOSIS — K769 Liver disease, unspecified: Secondary | ICD-10-CM | POA: Diagnosis not present

## 2024-08-17 DIAGNOSIS — R310 Gross hematuria: Secondary | ICD-10-CM | POA: Diagnosis not present

## 2024-08-17 DIAGNOSIS — N210 Calculus in bladder: Secondary | ICD-10-CM | POA: Diagnosis not present

## 2024-08-24 DIAGNOSIS — M25511 Pain in right shoulder: Secondary | ICD-10-CM | POA: Diagnosis not present

## 2024-08-25 DIAGNOSIS — M25511 Pain in right shoulder: Secondary | ICD-10-CM | POA: Diagnosis not present

## 2024-09-01 ENCOUNTER — Telehealth (HOSPITAL_BASED_OUTPATIENT_CLINIC_OR_DEPARTMENT_OTHER): Payer: Self-pay | Admitting: *Deleted

## 2024-09-01 ENCOUNTER — Telehealth (HOSPITAL_BASED_OUTPATIENT_CLINIC_OR_DEPARTMENT_OTHER): Payer: Self-pay

## 2024-09-01 DIAGNOSIS — S46011D Strain of muscle(s) and tendon(s) of the rotator cuff of right shoulder, subsequent encounter: Secondary | ICD-10-CM | POA: Diagnosis not present

## 2024-09-01 NOTE — Telephone Encounter (Signed)
   Name: Thomas [name]  DOB: 01-Sep-1946  MRN: [id number]  Primary Cardiologist: Oneil Parchment, MD   Preoperative team, please contact this patient and set up a phone call appointment for further preoperative risk assessment. Please obtain consent and complete medication review. Thank you for your help.  I confirm that guidance regarding antiplatelet and oral anticoagulation therapy has been completed and, if necessary, noted below.  Per office protocol, if patient is without any new symptoms or concerns at the time of their virtual visit, he may hold aspirin  for 5-7 days prior to procedure. Please resume aspirin  as soon as possible postprocedure, at the discretion of the surgeon.    I also confirmed the patient resides in the state of [place] . As per [facility] telemedicine laws, the patient must reside in the state in which the provider is licensed.   Lum LITTIE Louis, NP 09/01/2024, 4:13 PM [facility]

## 2024-09-01 NOTE — Telephone Encounter (Signed)
 Patient is returning call.

## 2024-09-01 NOTE — Telephone Encounter (Signed)
 Pt has been scheduled tele preop appt 10/05/24. Pt states surgery is not being planned until 11/2024. Med rec and consent are done.     Patient Consent for Virtual Visit        Thomas [name] has provided verbal consent on 09/01/2024 for a virtual visit (video or telephone).   CONSENT FOR VIRTUAL VISIT FOR:  Thomas [name]  By participating in this virtual visit I agree to the following:  I hereby voluntarily request, consent and authorize [facility] and its employed or contracted physicians, physician assistants, nurse practitioners or other licensed health care professionals (the Practitioner), to provide me with telemedicine health care services (the "Services) as deemed necessary by the treating Practitioner. I acknowledge and consent to receive the Services by the Practitioner via telemedicine. I understand that the telemedicine visit will involve communicating with the Practitioner through live audiovisual communication technology and the disclosure of certain medical information by electronic transmission. I acknowledge that I have been given the opportunity to request an in-person assessment or other available alternative prior to the telemedicine visit and am voluntarily participating in the telemedicine visit.  I understand that I have the right to withhold or withdraw my consent to the use of telemedicine in the course of my care at any time, without affecting my right to future care or treatment, and that the Practitioner or I may terminate the telemedicine visit at any time. I understand that I have the right to inspect all information obtained and/or recorded in the course of the telemedicine visit and may receive copies of available information for a reasonable fee.  I understand that some of the potential risks of receiving the Services via telemedicine include:  Delay or interruption in medical evaluation due to technological equipment failure or  disruption; Information transmitted may not be sufficient (e.g. poor resolution of images) to allow for appropriate medical decision making by the Practitioner; and/or  In rare instances, security protocols could fail, causing a breach of personal health information.  Furthermore, I acknowledge that it is my responsibility to provide information about my medical history, conditions and care that is complete and accurate to the best of my ability. I acknowledge that Practitioner's advice, recommendations, and/or decision may be based on factors not within their control, such as incomplete or inaccurate data provided by me or distortions of diagnostic images or specimens that may result from electronic transmissions. I understand that the practice of medicine is not an exact science and that Practitioner makes no warranties or guarantees regarding treatment outcomes. I acknowledge that a copy of this consent can be made available to me via my patient portal [facility] MyChart), or I can request a printed copy by calling the office of [facility].    I understand that my insurance will be billed for this visit.   I have read or had this consent read to me. I understand the contents of this consent, which adequately explains the benefits and risks of the Services being provided via telemedicine.  I have been provided ample opportunity to ask questions regarding this consent and the Services and have had my questions answered to my satisfaction. I give my informed consent for the services to be provided through the use of telemedicine in my medical care

## 2024-09-01 NOTE — Telephone Encounter (Signed)
 Pt has been scheduled tele preop appt 10/05/24. Pt states surgery is not being planned until 11/2024. Med rec and consent are done.

## 2024-09-01 NOTE — Telephone Encounter (Signed)
 Left a message to call back and schedule tele preop appt.

## 2024-09-01 NOTE — Telephone Encounter (Signed)
   Pre-operative Risk Assessment    Patient Name: Thomas [name]  DOB: 1946/01/25 MRN: [id number]   Date of last office visit: 10/29/23 with Dr. Jeffrie Date of next office visit: NA   Request for Surgical Clearance    Procedure:  Right reverse total shoulder arthroplasty  Date of Surgery:  Clearance TBD                                 Surgeon:  Dr. Elspeth Her Surgeon's Group or Practice Name:  [facility] Phone number:  [phone] attn: [place] Fax number:  [phone]   Type of Clearance Requested:   - Medical  - Pharmacy:  Hold Aspirin  not indicated   Type of Anesthesia:  choice   Additional requests/questions:    Bonney Augustin JONETTA Delores   09/01/2024, 3:54 PM

## 2024-09-04 DIAGNOSIS — N210 Calculus in bladder: Secondary | ICD-10-CM | POA: Diagnosis not present

## 2024-09-04 DIAGNOSIS — N401 Enlarged prostate with lower urinary tract symptoms: Secondary | ICD-10-CM | POA: Diagnosis not present

## 2024-09-04 DIAGNOSIS — R3912 Poor urinary stream: Secondary | ICD-10-CM | POA: Diagnosis not present

## 2024-09-04 DIAGNOSIS — R350 Frequency of micturition: Secondary | ICD-10-CM | POA: Diagnosis not present

## 2024-09-09 ENCOUNTER — Other Ambulatory Visit: Payer: Self-pay | Admitting: Urology

## 2024-09-09 DIAGNOSIS — N211 Calculus in urethra: Secondary | ICD-10-CM | POA: Diagnosis not present

## 2024-09-15 ENCOUNTER — Encounter (HOSPITAL_COMMUNITY): Payer: Self-pay

## 2024-09-15 NOTE — Patient Instructions (Signed)
 SURGICAL WAITING ROOM VISITATION Patients having surgery or a procedure may have no more than 2 support people in the waiting area - these visitors may rotate.    Children under the age of 53 must have an adult with them who is not the patient.  If the patient needs to stay at the hospital during part of their recovery, the visitor guidelines for inpatient rooms apply. Pre-op nurse will coordinate an appropriate time for 1 support person to accompany patient in pre-op.  This support person may not rotate.    Please refer to the [facility] website for the visitor guidelines for Inpatients (after your surgery is over and you are in a regular room).       Your procedure is scheduled on: 09-17-24   Report to [facility] Main Entrance    Report to admitting at 7:45 AM   Call this number if you have problems the morning of surgery [phone]   Do not eat food or drink liquids :After Midnight.           If you have questions, please contact your surgeon's office.   FOLLOW ANY ADDITIONAL PRE OP INSTRUCTIONS YOU RECEIVED FROM YOUR SURGEON'S OFFICE!!!     Oral Hygiene is also important to reduce your risk of infection.                                    Remember - BRUSH YOUR TEETH THE MORNING OF SURGERY WITH YOUR REGULAR TOOTHPASTE   Do NOT smoke after Midnight   Take these medicines the morning of surgery with A SIP OF WATER :    Metoprolol    Tylenol  if needed  Stop all vitamins and herbal supplements 7 days before surgery  Bring CPAP mask and tubing day of surgery.                              You may not have any metal on your body including jewelry, and body piercing             Do not wear lotions, powders,  cologne, or deodorant              Men may shave face and neck.   Do not bring valuables to the hospital.  IS NOT RESPONSIBLE   FOR VALUABLES.   Contacts, dentures or bridgework may not be worn into surgery.  DO NOT BRING YOUR HOME MEDICATIONS TO  THE HOSPITAL. PHARMACY WILL DISPENSE MEDICATIONS LISTED ON YOUR MEDICATION LIST TO YOU DURING YOUR ADMISSION IN THE HOSPITAL!    Patients discharged on the day of surgery will not be allowed to drive home.  Someone NEEDS to stay with you for the first 24 hours after anesthesia.   Special Instructions: Bring a copy of your healthcare power of attorney and living will documents the day of surgery if you haven't scanned them before.              Please read over the following fact sheets you were given: IF YOU HAVE QUESTIONS ABOUT YOUR PRE-OP INSTRUCTIONS PLEASE CALL [phone] Gwen  If you received a COVID test during your pre-op visit  it is requested that you wear a mask when out in public, stay away from anyone that may not be feeling well and notify your surgeon if you develop symptoms. If you test positive  for Covid or have been in contact with anyone that has tested positive in the last 10 days please notify you surgeon.  [facility] - Preparing for Surgery Before surgery, you can play an important role.  Because skin is not sterile, your skin needs to be as free of germs as possible.  You can reduce the number of germs on your skin by washing with CHG (chlorahexidine gluconate) soap before surgery.  CHG is an antiseptic cleaner which kills germs and bonds with the skin to continue killing germs even after washing. Please DO NOT use if you have an allergy to CHG or antibacterial soaps.  If your skin becomes reddened/irritated stop using the CHG and inform your nurse when you arrive at Short Stay. Do not shave (including legs and underarms) for at least 48 hours prior to the first CHG shower.  You may shave your face/neck.  Please follow these instructions carefully:  1.  Shower with CHG Soap the night before surgery and the  morning of surgery.  2.  If you choose to wash your hair, wash your hair first as usual with your normal  shampoo.  3.  After you shampoo, rinse your hair and body  thoroughly to remove the shampoo.                             4.  Use CHG as you would any other liquid soap.  You can apply chg directly to the skin and wash.  Gently with a scrungie or clean washcloth.  5.  Apply the CHG Soap to your body ONLY FROM THE NECK DOWN.   Do   not use on face/ open                           Wound or open sores. Avoid contact with eyes, ears mouth and   genitals (private parts).                       Wash face,  Genitals (private parts) with your normal soap.             6.  Wash thoroughly, paying special attention to the area where your    surgery  will be performed.  7.  Thoroughly rinse your body with warm water  from the neck down.  8.  DO NOT shower/wash with your normal soap after using and rinsing off the CHG Soap.                9.  Pat yourself dry with a clean towel.            10.  Wear clean pajamas.            11.  Place clean sheets on your bed the night of your first shower and do not  sleep with pets. Day of Surgery : Do not apply any lotions/deodorants the morning of surgery.  Please wear clean clothes to the hospital/surgery center.  FAILURE TO FOLLOW THESE INSTRUCTIONS MAY RESULT IN THE CANCELLATION OF YOUR SURGERY  PATIENT SIGNATURE_________________________________  NURSE SIGNATURE__________________________________  ________________________________________________________________________

## 2024-09-15 NOTE — Progress Notes (Signed)
 Date of COVID positive in last 90 days:  PCP - Lamar Ng, MD Cardiologist - Oneil Parchment, MD  Chest x-ray - N/A EKG - 10-29-23 Epic Stress Test - N/A ECHO - 09-27-23 Epic Cardiac Cath - 12-23-20 Epic Pacemaker/ICD device last checked:N/A Spinal Cord Stimulator:N/A  Bowel Prep - N/A  Sleep Study - N/A CPAP -   Fasting Blood Sugar - N/A Checks Blood Sugar _____ times a day  Last dose of GLP1 agonist-  N/A GLP1 instructions:  Do not take after     Last dose of SGLT-2 inhibitors-  N/A SGLT-2 instructions:  Do not take after     Blood Thinner Instructions: N/A Last dose:   Time: Aspirin  Instructions:N/A Last Dose:  Activity level:  Can go up a flight of stairs and perform activities of daily living without stopping and without symptoms of chest pain or shortness of breath.  Able to exercise without symptoms  Unable to go up a flight of stairs without symptoms of     Anesthesia review: MVP, mitral regurg, s/p mitral valve repair  Patient denies shortness of breath, fever, cough and chest pain at PAT appointment  Patient verbalized understanding of instructions that were given to them at the PAT appointment. Patient was also instructed that they will need to review over the PAT instructions again at home before surgery.

## 2024-09-16 ENCOUNTER — Other Ambulatory Visit: Payer: Self-pay

## 2024-09-16 ENCOUNTER — Encounter (HOSPITAL_COMMUNITY)
Admission: RE | Admit: 2024-09-16 | Discharge: 2024-09-16 | Disposition: A | Discharge status: Home / Self Care | Source: Ambulatory Visit | Attending: Urology | Admitting: Urology

## 2024-09-16 ENCOUNTER — Encounter (HOSPITAL_COMMUNITY): Payer: Self-pay

## 2024-09-16 VITALS — BP 120/74 | HR 78 | Temp 98.0°F | Resp 16 | Ht 70.0 in | Wt 176.6 lb

## 2024-09-16 DIAGNOSIS — D649 Anemia, unspecified: Secondary | ICD-10-CM | POA: Insufficient documentation

## 2024-09-16 DIAGNOSIS — N210 Calculus in bladder: Secondary | ICD-10-CM | POA: Insufficient documentation

## 2024-09-16 DIAGNOSIS — Z01818 Encounter for other preprocedural examination: Secondary | ICD-10-CM | POA: Insufficient documentation

## 2024-09-16 HISTORY — DX: Anemia, unspecified: D64.9

## 2024-09-16 LAB — BASIC METABOLIC PANEL WITH GFR
Anion gap: 10 (ref 5–15)
BUN: 21 mg/dL (ref 8–23)
CO2: 28 mmol/L (ref 22–32)
Calcium: 10.2 mg/dL (ref 8.9–10.3)
Chloride: 101 mmol/L (ref 98–111)
Creatinine, Ser: 0.9 mg/dL (ref 0.61–1.24)
GFR, Estimated: >60 mL/min (ref >60)
Glucose, Bld: 104 mg/dL — ABNORMAL HIGH (ref 70–99)
Potassium: 4.6 mmol/L (ref 3.5–5.1)
Sodium: 139 mmol/L (ref 135–145)

## 2024-09-16 LAB — CBC
HCT: 44.5 % (ref 39.0–52.0)
Hemoglobin: 14.7 g/dL (ref 13.0–17.0)
MCH: 33.3 pg (ref 26.0–34.0)
MCHC: 33 g/dL (ref 30.0–36.0)
MCV: 100.9 fL — ABNORMAL HIGH (ref 80.0–100.0)
Platelets: 229 K/uL (ref 150–400)
RBC: 4.41 MIL/uL (ref 4.22–5.81)
RDW: 13 % (ref 11.5–15.5)
WBC: 5.7 K/uL (ref 4.0–10.5)
nRBC: 0 % (ref 0.0–0.2)

## 2024-09-16 NOTE — H&P (Signed)
 Visit Note - September 04, 2024 Thomas [name], Thomas [name] MRN: [id number] Phone: [phone] DOB: 10/01/1946 Sex: Male PMS ID: [id number] UBALDO EAGLES (Primary Provider) (Bill Under) Page 1 [phone] Work [phone] Fax [facility] [address] [place] janifer [place], [place] [address] Social History Reviewed September 04, 2024. Smoking status - Unspecified Medications Obtained and Reviewed September 04, 2024. Adult Low Dose Aspirin  81 mg Oral - tablet, delayed release (enteric coated) metoprolol  succinate oral Myrbetriq oral tamsulosin  0.4 mg Oral - capsule Allergies Reviewed September 04, 2024. No known drug allergies Medical History Reviewed and no changes noted September 04, 2024. Arthritis Surgical History Reviewed and no changes noted September 04, 2024. Biopsy of prostate Vasectomy Chief Complaints: 1. Bladder Stone HPI: This is a 78 year old male who: is being seen for a chief complaint of Bladder Stone. He was referred by Dr. Lamar Ng [facility]) (UNKNOWN). 09/04/2024: Cystoscopy at last visit a couple of weeks ago resulted in several small stones in the prostatic urethra being pushed back into the patient's bladder. Since last exam he has been having some exacerbated LUTS mostly in the form of increased frequency/urgency as well is weak and intermittency of stream. Also having some intermittent pain/discomfort with voiding. Denies hematuria. No correlating lower back or flank pain/discomfort. No interval fever/chills or nausea/vomiting. He could not leave a voided urine specimen upon arrival but PVR is less than 1 ounce. 1. Historical Summary: 1) Prostate cancer: He was initially evaluated in September 2013 for an elevated PSA of 5.37 prompting a prostate biopsy at that time which indicated HGPIN atypia at the left lateral base. For this reason, he underwent a follow up repeat biopsy on 03/27/13 which demonstrated Gleason 3+3=6 adenocarcinoma in  < 5% of 1 out of 16 biopsy cores. This was reviewed by Dr. Elbert at [facility]. After discussing options for treatment/management, he elected to proceed with active surveillance. He was determined to have upgraded Gleason 8 disease on an MR/US  fusion biopsy in September 2022. He was evaluated in the [facility] and elected to proceed with long term ADT and EBRT although wished to undergo TURP prior to EBRT due to his baseline voiding symptoms and BPH. Initial diagnosis: May 2014 TNM stage: cT1c Nx Mx PSA: 5.37 Gleason score: 3+3=6 Biopsy (03/27/13): 1/16 cores -- L mid (<5%), Vol 76.3 cc PSAD: 0.06 Surveillance Jan 2015: MRI - 1.6 cm nodule at R central base Jan 2015: 26 core biopsy - 1/26 cores positive -- L mid (5%, 3+3=6), Vol 87.6 cc Feb 2017: 12 core biopsy - benign, Vol 85.2 cc Feb 2019: 12 core biopsy - 1/12 cores positive - L base (5%, 3+3=6), Vol 105.5 cc May 2022: MRI - 2.5 cm PI RADS 3 lesion of left apical transition zone Sep 2022: MR/US  fusion biopsy - upgraded Gleason 3+5=8 adenocarcinoma in 3 out of 4 targeted biopsies, Vol 103.8 cc Nov 2022: Started ADT Jan 2023: TURP (and SpaceOAR/fiducial markers) - 27 g of tissue, benign Mar-May 2023: IMRT Baseline urinary function: He has minimal baseline voiding symptoms. IPSS is 5. Baseline erectile function: He denies erectile dysfunction. SHIM score is 22. 2) BPH/LUTS: He has a known enlarged prostate (> 100 cc). He developed postoperative urinary retention after his mitral valve repair in 2022. He is s/p TURP in January 2023 prior to his radiation therapy for prostate cancer. Current treatment: Myrbetriq 25 mg, tamsulosin  0.4 mg (restarted September 2025) Prior treatment: Tamsulosin , TURP (Jan 2023) 3) Hematuria: He developed painless gross hematuria a couple  of times in September 2025. He does have a history of radiation therapy for prostate cancer. Exam: Exam Appearance: well developed and nourished, in no acute distress Neck Exam: neck  is supple Visit Note - September 04, 2024 Thomas [name], Thomas [name] MRN: [id number] Phone: [phone] DOB: 03-Sep-1946 Sex: Male PMS ID: [id number] UBALDO EAGLES (Primary Provider) (Bill Under) Page 2 [phone] Work [phone] Fax [facility] [address] [place] [address] [place], [place] [address] Respiratory Effort: normal respiratory effort without labored breathing  Skin Inspection: normal skin turgor Orientation: alert and oriented to person, place, time Mood: mood and affect well-adjusted, pleasant and cooperative, appropriate for clinical and encounter circumstances Data Reviewed:  3 Ordering of each unique test (Post Void Residual (PVR), Urinalysis, Routine, Urine Culture, Routine) Tests Post Void Residual (PVR) Indication: BPH Post-void residual volume (bladder scan) = 24 cc Impression/Plan: He is having exacerbated irritative and obstructive voiding symptoms. Fortunately not in retention. He is going to attempt to leave a voided urine specimen prior to leaving clinic today or return in a few hours to drop off a urine so that a culture/sensitivity can be sent. Patient would like to proceed with cystolitholopaxy, he understands possibility of needing a TURP revision at that time as well. All questions answered regarding the procedure as well as the expected peri and postoperative course to the best of my ability with understanding expressed by the patient. I will send a message to Dr. Renda regarding today's visit. Based on that conversation, the patient will be contacted and scheduled appropriately. Bladder Stone bladder (N21.0) Plan: Counseling for Lower Tract Urolithiasis. Please refer to the education handout for detailed counseling. After counseling, we decided on the following plan: Cystolithotomy 1. BPH With LUTS (N40.1) Associated diagnoses: Weak Urinary Stream and Urinary Frequency Plan: Counseling for BPH. Please refer to the education handout for  detailed counseling. After counseling, we decided on the following plan: Transurethral Resection of Prostate and Prescription Medication Management Plan: Visit complexity. Medical care services associated with ongoing care, beyond the standard evaluation and management Indication: Provided medical care services as part of ongoing care related to the patient's single, serious or complex chronic condition. Plan: Order Tests. Labs:  996961 - Urinalysis, Routine  [id number] - Urine Culture, Routine Clinical notes (to the lab): Patient is going to come back today and drop off a urine sample 2. Follow up. Other Instructions: Will discuss with Dr Renda about setting up for surgery, patient will be contacted Visit Note - September 04, 2024 Thomas [name], Thomas [name] MRN: [id number] Phone: [phone] DOB: 1946/05/20 Sex: Male PMS ID: [id number] UBALDO EAGLES (Primary Provider) (Bill Under) Page 3 [phone] Work [phone] Fax [facility] [address] [place] [address], [place] [address] once that occurs and scheduled appropriately Staff: UBALDO EAGLES (Primary Provider) Cherylin Under) Laymon Comings Patient Referrals: Frederik mast), Lamar MD - Primary Care Provider (PCP) Electronically Signed By: UBALDO EAGLES, 09/04/2024 08:51 AM EDT

## 2024-09-16 NOTE — Anesthesia Preprocedure Evaluation (Addendum)
 Anesthesia Evaluation  Patient identified by MRN, date of birth, ID band Patient awake    Reviewed: Allergy & Precautions, NPO status , Patient's Chart, lab work & pertinent test results  History of Anesthesia Complications Negative for: history of anesthetic complications  Airway Mallampati: III  TM Distance: >3 FB Neck ROM: Full   Comment: Previous grade II view with MAC 4, easy mask Dental  (+) Dental Advisory Given, Poor Dentition   Pulmonary neg pulmonary ROS   Pulmonary exam normal breath sounds clear to auscultation       Cardiovascular (-) hypertension(-) angina (-) Past MI, (-) Cardiac Stents and (-) CABG (-) dysrhythmias + Valvular Problems/Murmurs (s/p minimally invasive MV repair 01/24/2021) MVP and MR  Rhythm:Regular Rate:Normal  TTE 09/27/2023: IMPRESSIONS    1. S/P MV repair with mean gradient 4 mmHg and mild MR.   2. Left ventricular ejection fraction, by estimation, is 60 to 65%. The  left ventricle has normal function. The left ventricle has no regional  wall motion abnormalities. Left ventricular diastolic parameters are  indeterminate. The average left  ventricular global longitudinal strain is -19.7 %. The global longitudinal  strain is normal.   3. Right ventricular systolic function is normal. The right ventricular  size is normal.   4. Left atrial size was moderately dilated.   5. The mitral valve has been repaired/replaced. Mild mitral valve  regurgitation. No evidence of mitral stenosis. There is a 32 mm Memo  prosthetic annuloplasty ring present in the mitral position. Procedure  Date: 01/24/21.   6. The aortic valve is tricuspid. Aortic valve regurgitation is mild. No  aortic stenosis is present.   7. The inferior vena cava is normal in size with greater than 50%  respiratory variability, suggesting right atrial pressure of 3 mmHg.   Cath 12/23/2020: 1.  Patent coronary arteries with mild  nonobstructive narrowing at the ostium of the left main and ostium of the left circumflex, no significant stenosis present. 2.  Known severe mitral regurgitation by noninvasive assessment with normal pulmonary capillary wedge pressure and pulmonary artery pressures, preserved cardiac output     Neuro/Psych negative neurological ROS     GI/Hepatic negative GI ROS, Neg liver ROS,,,  Endo/Other  negative endocrine ROS    Renal/GU negative Renal ROS   BPH, prostate cancer    Musculoskeletal  (+) Arthritis ,    Abdominal   Peds  Hematology negative hematology ROS (+) Lab Results      Component                Value               Date                      WBC                      5.7                 09/16/2024                HGB                      14.7                09/16/2024                HCT  44.5                09/16/2024                MCV                      100.9 (H)           09/16/2024                PLT                      229                 09/16/2024              Anesthesia Other Findings   Reproductive/Obstetrics                              Anesthesia Physical Anesthesia Plan  ASA: 3  Anesthesia Plan: General   Post-op Pain Management: Tylenol  PO (pre-op)*   Induction: Intravenous  PONV Risk Score and Plan: 2 and Ondansetron , Dexamethasone  and Treatment may vary due to age or medical condition  Airway Management Planned: Oral ETT  Additional Equipment:   Intra-op Plan:   Post-operative Plan: Extubation in OR  Informed Consent: I have reviewed the patients History and Physical, chart, labs and discussed the procedure including the risks, benefits and alternatives for the proposed anesthesia with the patient or authorized representative who has indicated his/her understanding and acceptance.     Dental advisory given  Plan Discussed with: Anesthesiologist  Anesthesia Plan Comments: (See PAT  note 09/16/24  Risks of general anesthesia discussed including, but not limited to, sore throat, hoarse voice, chipped/damaged teeth, injury to vocal cords, nausea and vomiting, allergic reactions, lung infection, heart attack, stroke, and death. All questions answered. )         Anesthesia Quick Evaluation

## 2024-09-16 NOTE — Progress Notes (Signed)
 Anesthesia Chart Review   Case: [id number] Date/Time: 09/17/24 0945   Procedures:      CYSTOSCOPY, WITH BLADDER CALCULUS LITHOLAPAXY     CYSTOSCOPY, WITH BLADDER FULGURATION     TURP (TRANSURETHRAL RESECTION OF PROSTATE)   Anesthesia type: General   Diagnosis: Bladder calculus [N21.0]   Pre-op diagnosis: BLADDER CALCULUS   Location: [facility]   Surgeons: Renda Glance, MD       DISCUSSION:78 y.o. never smoker with h/o s/p MV repair 2022, prostate cancer s/p radiation, bladder calculus scheduled for above procedure 09/17/2024 with Dr. Glance Renda.   Pt last seen by cardiology 10/29/2023. Mitral valve repair stable, pt asymptomatic.  Pt has 1 year follow up scheduled.   Reports to PAT nurse he can climb a flight of stairs without chest pain or shortness of breath.  VS: BP 120/74   Pulse 78   Temp 36.7 C (Oral)   Resp 16   Ht 5' 10 (1.778 m)   Wt 80.1 kg   SpO2 96%   BMI 25.34 kg/m   PROVIDERS: Frederik Charleston, MD is PCP   Cardiologist - Oneil Parchment, MD  LABS: Labs reviewed: Acceptable for surgery. (all labs ordered are listed, but only abnormal results are displayed)  Labs Reviewed  BASIC METABOLIC PANEL WITH GFR - Abnormal; Notable for the following components:      Result Value   Glucose, Bld 104 (*)    All other components within normal limits  CBC - Abnormal; Notable for the following components:   MCV 100.9 (*)    All other components within normal limits     IMAGES:   EKG:   CV: Echo 09/27/23 1. S/P MV repair with mean gradient 4 mmHg and mild MR.   2. Left ventricular ejection fraction, by estimation, is 60 to 65%. The  left ventricle has normal function. The left ventricle has no regional  wall motion abnormalities. Left ventricular diastolic parameters are  indeterminate. The average left  ventricular global longitudinal strain is -19.7 %. The global longitudinal  strain is normal.   3. Right ventricular systolic function is  normal. The right ventricular  size is normal.   4. Left atrial size was moderately dilated.   5. The mitral valve has been repaired/replaced. Mild mitral valve  regurgitation. No evidence of mitral stenosis. There is a 32 mm Memo  prosthetic annuloplasty ring present in the mitral position. Procedure  Date: 01/24/21.   6. The aortic valve is tricuspid. Aortic valve regurgitation is mild. No  aortic stenosis is present.   7. The inferior vena cava is normal in size with greater than 50%  respiratory variability, suggesting right atrial pressure of 3 mmHg.   Past Medical History:  Diagnosis Date   Anemia    Arthritis    Bilateral inguinal hernia    BPH (benign prostatic hyperplasia)    Cancer (HCC)    prostate cancer; per patient being followed by Dr Renda at [facility] urology ; currently no chemotherapy    Heart murmur    Mitral regurgitation    Mitral valve prolapse    now seeing Dr Parchment    Prostate cancer [facility])    S/P minimally-invasive mitral valve repair 01/24/2021   Complex valvuloplasty including quadrangular resection of posterior leaflet with 32 mm Sorin Memo 4D ring annuloplasty via right mini thoracotomy result    Past Surgical History:  Procedure Laterality Date   BUBBLE STUDY  11/23/2020   Procedure: BUBBLE STUDY;  Surgeon: Barbaraann Darryle Ned, MD;  Location: [facility] ENDOSCOPY;  Service: Cardiovascular;;   FEMORAL HERNIA REPAIR Right 07/22/2017   Procedure: HERNIA REPAIR FEMORAL;  Surgeon: Lily Boas, MD;  Location[facility];  Service: General;  Laterality: Right;   GOLD SEED IMPLANT N/A 11/27/2021   Procedure: GOLD SEED IMPLANT;  Surgeon: Renda Glance, MD;  [facility];  Service: Urology;  Laterality: N/A;   INGUINAL HERNIA REPAIR N/A 07/22/2017   Procedure: LAPAROSCOPIC BILATERAL INGUINAL HERNIA REPAIR WITH MESH;  Surgeon: Lily Boas, MD;  [facility];  Service: General;  Laterality: N/A;   INSERTION OF MESH Bilateral 07/22/2017   Procedure:  INSERTION OF MESH;  Surgeon: Lily Boas, MD;  [facility];  Service: General;  Laterality: Bilateral;   MITRAL VALVE REPAIR Right 01/24/2021   Procedure: MINIMALLY INVASIVE MITRAL VALVE REPAIR (MVR) USING 4D MEMO RING SIZE ;  Surgeon: Dusty Sudie DEL, MD;  Location: [facility] OR;  Service: Open Heart Surgery;  Laterality: Right;   PROSTATE BIOPSY  07/2021   QUADRICEPS TENDON REPAIR Left 08/09/2021   Procedure: OPEN LEFT KNEE QUAD REPAIR;  Surgeon: Sharl Selinda Dover, MD;  [facility];  Service: Orthopedics;  Laterality: Left;   RIGHT/LEFT HEART CATH AND CORONARY ANGIOGRAPHY N/A 12/23/2020   Procedure: RIGHT/LEFT HEART CATH AND CORONARY ANGIOGRAPHY;  Surgeon: Wonda Sharper, MD;  Location: [facility] INVASIVE CV LAB;  Service: Cardiovascular;  Laterality: N/A;   SPACE OAR INSTILLATION N/A 11/27/2021   Procedure: SPACE OAR INSTILLATION;  Surgeon: Renda Glance, MD;  [facility];  Service: Urology;  Laterality: N/A;   TEE WITHOUT CARDIOVERSION N/A 11/23/2020   Procedure: TRANSESOPHAGEAL ECHOCARDIOGRAM (TEE);  Surgeon: Barbaraann Darryle Ned, MD;  Location: [facility] ENDOSCOPY;  Service: Cardiovascular;  Laterality: N/A;   TEE WITHOUT CARDIOVERSION N/A 01/24/2021   Procedure: TRANSESOPHAGEAL ECHOCARDIOGRAM (TEE);  Surgeon: Dusty Sudie DEL, MD;  Location: [facility] OR;  Service: Open Heart Surgery;  Laterality: N/A;   TONSILLECTOMY AND ADENOIDECTOMY     as a child   TRANSRECTAL ULTRASOUND N/A 11/27/2021   Procedure: TRANSRECTAL ULTRASOUND;  Surgeon: Renda Glance, MD;  [facility];  Service: Urology;  Laterality: N/A;   TRANSURETHRAL RESECTION OF PROSTATE N/A 11/27/2021   Procedure: TRANSURETHRAL RESECTION OF THE PROSTATE (TURP) WITH CYSTOSCOPY/;  Surgeon: Renda Glance, MD;  [facility];  Service: Urology;  Laterality: N/A;   VASECTOMY  1977    MEDICATIONS:  acetaminophen  (TYLENOL ) 650 MG CR tablet   ascorbic acid (VITAMIN C) 1000 MG tablet   aspirin  EC 81 MG tablet   Calcium  Carb-Cholecalciferol (CALCIUM 600/VITAMIN D3) 600-800 MG-UNIT TABS   cyanocobalamin (VITAMIN B12) 500 MCG tablet   Glucosamine-Chondroit-Vit C-Mn (GLUCOSAMINE CHONDR 1500 COMPLX) CAPS   ibuprofen (ADVIL) 200 MG tablet   metoprolol  tartrate (LOPRESSOR ) 25 MG tablet   Multiple Vitamin (MULTI VITAMIN) TABS   rosuvastatin (CRESTOR) 10 MG tablet   tamsulosin  (FLOMAX ) 0.4 MG CAPS capsule   Turmeric 500 MG CAPS   No current facility-administered medications for this encounter.      Harlene Hoots Ward, PA-C [facility] [phone]

## 2024-09-17 ENCOUNTER — Encounter (HOSPITAL_COMMUNITY)
Admission: RE | Disposition: A | Discharge status: Home / Self Care | Payer: Self-pay | Source: Home / Self Care | Attending: Urology

## 2024-09-17 ENCOUNTER — Encounter (HOSPITAL_COMMUNITY): Payer: Self-pay | Admitting: Urology

## 2024-09-17 ENCOUNTER — Ambulatory Visit (HOSPITAL_COMMUNITY): Admitting: Anesthesiology

## 2024-09-17 ENCOUNTER — Ambulatory Visit (HOSPITAL_COMMUNITY): Admitting: Physician Assistant

## 2024-09-17 ENCOUNTER — Ambulatory Visit (HOSPITAL_COMMUNITY)
Admission: RE | Admit: 2024-09-17 | Discharge: 2024-09-17 | Disposition: A | Discharge status: Home / Self Care | Attending: Urology | Admitting: Urology

## 2024-09-17 DIAGNOSIS — N211 Calculus in urethra: Secondary | ICD-10-CM | POA: Diagnosis not present

## 2024-09-17 DIAGNOSIS — I340 Nonrheumatic mitral (valve) insufficiency: Secondary | ICD-10-CM

## 2024-09-17 DIAGNOSIS — M1990 Unspecified osteoarthritis, unspecified site: Secondary | ICD-10-CM | POA: Insufficient documentation

## 2024-09-17 DIAGNOSIS — R338 Other retention of urine: Secondary | ICD-10-CM | POA: Diagnosis not present

## 2024-09-17 DIAGNOSIS — R3912 Poor urinary stream: Secondary | ICD-10-CM | POA: Diagnosis not present

## 2024-09-17 DIAGNOSIS — N401 Enlarged prostate with lower urinary tract symptoms: Secondary | ICD-10-CM | POA: Diagnosis not present

## 2024-09-17 DIAGNOSIS — Z8546 Personal history of malignant neoplasm of prostate: Secondary | ICD-10-CM | POA: Diagnosis not present

## 2024-09-17 DIAGNOSIS — R3915 Urgency of urination: Secondary | ICD-10-CM | POA: Insufficient documentation

## 2024-09-17 DIAGNOSIS — N138 Other obstructive and reflux uropathy: Secondary | ICD-10-CM | POA: Insufficient documentation

## 2024-09-17 DIAGNOSIS — R350 Frequency of micturition: Secondary | ICD-10-CM | POA: Diagnosis not present

## 2024-09-17 DIAGNOSIS — Z923 Personal history of irradiation: Secondary | ICD-10-CM | POA: Insufficient documentation

## 2024-09-17 DIAGNOSIS — N210 Calculus in bladder: Secondary | ICD-10-CM | POA: Insufficient documentation

## 2024-09-17 HISTORY — PX: CYSTOSCOPY WITH LITHOLAPAXY: SHX1425

## 2024-09-17 SURGERY — CYSTOSCOPY, WITH BLADDER CALCULUS LITHOLAPAXY
Anesthesia: General

## 2024-09-17 MED ORDER — 0.9 % SODIUM CHLORIDE (POUR BTL) OPTIME
TOPICAL | Status: DC | PRN
  Administered 2024-09-17: 1000 mL

## 2024-09-17 MED ORDER — FENTANYL CITRATE (PF) 100 MCG/2ML IJ SOLN
INTRAMUSCULAR | Status: AC
  Filled 2024-09-17: qty 2

## 2024-09-17 MED ORDER — ORAL CARE MOUTH RINSE: 15.0000 mL | Freq: Once | OROMUCOSAL | Status: AC

## 2024-09-17 MED ORDER — SODIUM CHLORIDE 0.9 % IV SOLN
2.0000 g | INTRAVENOUS | Status: AC
  Administered 2024-09-17: 2 g via INTRAVENOUS
  Filled 2024-09-17: qty 20

## 2024-09-17 MED ORDER — LACTATED RINGERS IV SOLN: INTRAVENOUS | Status: DC

## 2024-09-17 MED ORDER — EPHEDRINE SULFATE (PRESSORS) 25 MG/5ML IV SOSY
PREFILLED_SYRINGE | INTRAVENOUS | Status: DC | PRN
  Administered 2024-09-17: 10 mg via INTRAVENOUS
  Administered 2024-09-17: 15 mg via INTRAVENOUS

## 2024-09-17 MED ORDER — AMISULPRIDE (ANTIEMETIC) 5 MG/2ML IV SOLN: 10.0000 mg | Freq: Once | INTRAVENOUS | Status: DC | PRN

## 2024-09-17 MED ORDER — PROPOFOL 10 MG/ML IV BOLUS
INTRAVENOUS | Status: DC | PRN
  Administered 2024-09-17: 150 mg via INTRAVENOUS

## 2024-09-17 MED ORDER — ACETAMINOPHEN 500 MG PO TABS
1000.0000 mg | ORAL_TABLET | Freq: Once | ORAL | Status: AC
  Administered 2024-09-17: 1000 mg via ORAL
  Filled 2024-09-17: qty 2

## 2024-09-17 MED ORDER — FENTANYL CITRATE (PF) 50 MCG/ML IJ SOSY: 25.0000 ug | PREFILLED_SYRINGE | INTRAMUSCULAR | Status: DC | PRN

## 2024-09-17 MED ORDER — SODIUM CHLORIDE 0.9 % IR SOLN
Status: DC | PRN
  Administered 2024-09-17: 3000 mL

## 2024-09-17 MED ORDER — OXYCODONE HCL 5 MG/5ML PO SOLN: 5.0000 mg | Freq: Once | ORAL | Status: DC | PRN

## 2024-09-17 MED ORDER — SODIUM CHLORIDE 0.9 % IV SOLN: INTRAVENOUS | Status: DC

## 2024-09-17 MED ORDER — CHLORHEXIDINE GLUCONATE 0.12 % MT SOLN
15.0000 mL | Freq: Once | OROMUCOSAL | Status: AC
  Administered 2024-09-17: 15 mL via OROMUCOSAL

## 2024-09-17 MED ORDER — PHENYLEPHRINE 80 MCG/ML (10ML) SYRINGE FOR IV PUSH (FOR BLOOD PRESSURE SUPPORT)
PREFILLED_SYRINGE | INTRAVENOUS | Status: AC
  Filled 2024-09-17: qty 10

## 2024-09-17 MED ORDER — PHENAZOPYRIDINE HCL 200 MG PO TABS: 200.0000 mg | ORAL_TABLET | Freq: Three times a day (TID) | ORAL | 0 refills | Status: AC | PRN

## 2024-09-17 MED ORDER — PHENYLEPHRINE 80 MCG/ML (10ML) SYRINGE FOR IV PUSH (FOR BLOOD PRESSURE SUPPORT)
PREFILLED_SYRINGE | INTRAVENOUS | Status: DC | PRN
  Administered 2024-09-17: 240 ug via INTRAVENOUS
  Administered 2024-09-17: 160 ug via INTRAVENOUS

## 2024-09-17 MED ORDER — FENTANYL CITRATE (PF) 100 MCG/2ML IJ SOLN
INTRAMUSCULAR | Status: DC | PRN
  Administered 2024-09-17: 50 ug via INTRAVENOUS

## 2024-09-17 MED ORDER — LIDOCAINE HCL (PF) 2 % IJ SOLN
INTRAMUSCULAR | Status: DC | PRN
  Administered 2024-09-17: 100 mg via INTRADERMAL

## 2024-09-17 MED ORDER — ONDANSETRON HCL 4 MG/2ML IJ SOLN
INTRAMUSCULAR | Status: DC | PRN
  Administered 2024-09-17: 4 mg via INTRAVENOUS

## 2024-09-17 MED ORDER — OXYCODONE HCL 5 MG PO TABS: 5.0000 mg | ORAL_TABLET | Freq: Once | ORAL | Status: DC | PRN

## 2024-09-17 MED ORDER — PROPOFOL 10 MG/ML IV BOLUS
INTRAVENOUS | Status: AC
Start: 2024-09-17 — End: 2024-09-17
  Filled 2024-09-17: qty 20

## 2024-09-17 MED ORDER — DEXAMETHASONE SOD PHOSPHATE PF 10 MG/ML IJ SOLN
INTRAMUSCULAR | Status: DC | PRN
  Administered 2024-09-17: 10 mg via INTRAVENOUS

## 2024-09-17 MED ORDER — EPHEDRINE 5 MG/ML INJ
INTRAVENOUS | Status: AC
  Filled 2024-09-17: qty 5

## 2024-09-17 SURGICAL SUPPLY — 20 items
BAG URINE DRAIN 2000ML AR STRL (UROLOGICAL SUPPLIES) ×2 IMPLANT
BAG URO CATCHER STRL LF (MISCELLANEOUS) ×2 IMPLANT
CATH HEMA 3WAY 30CC 22FR COUDE (CATHETERS) IMPLANT
CLOTH BEACON ORANGE TIMEOUT ST (SAFETY) ×2 IMPLANT
DRAPE FOOT SWITCH (DRAPES) ×2 IMPLANT
ELECT REM PT RETURN 15FT ADLT (MISCELLANEOUS) ×2 IMPLANT
GLOVE SURG LX STRL 7.5 STRW (GLOVE) ×2 IMPLANT
GOWN STRL REUS W/ TWL XL LVL3 (GOWN DISPOSABLE) ×2 IMPLANT
HOLDER FOLEY CATH W/STRAP (MISCELLANEOUS) IMPLANT
KIT TURNOVER KIT A (KITS) ×2 IMPLANT
LASER FIB FLEXIVA PULSE ID 550 (Laser) IMPLANT
LOOP CUT BIPOLAR 24F LRG (ELECTROSURGICAL) IMPLANT
MANIFOLD NEPTUNE II (INSTRUMENTS) ×2 IMPLANT
PACK CYSTO (CUSTOM PROCEDURE TRAY) ×2 IMPLANT
PAD PREP 24X48 CUFFED NSTRL (MISCELLANEOUS) ×2 IMPLANT
PENCIL SMOKE EVACUATOR (MISCELLANEOUS) IMPLANT
SYR 30ML LL (SYRINGE) ×2 IMPLANT
SYRINGE TOOMEY IRRIG 70ML (MISCELLANEOUS) ×2 IMPLANT
TUBING CONNECTING 10 (TUBING) ×2 IMPLANT
TUBING UROLOGY SET (TUBING) ×2 IMPLANT

## 2024-09-17 NOTE — Transfer of Care (Signed)
 Immediate Anesthesia Transfer of Care Note  Patient: Thomas [name]  Procedure(s) Performed: CYSTOSCOPY, WITH BLADDER CALCULUS LITHOLAPAXY  Patient Location: PACU  Anesthesia Type:General  Level of Consciousness: sedated  Airway & Oxygen Therapy: Patient Spontanous Breathing and Patient connected to face mask oxygen  Post-op Assessment: Report given to RN and Post -op Vital signs reviewed and stable  Post vital signs: Reviewed and stable  Last Vitals:  Vitals Value Taken Time  BP    Temp    Pulse 79 09/17/24 10:16  Resp    SpO2 99 % 09/17/24 10:16  Vitals shown include unfiled device data.  Last Pain:  Vitals:   09/17/24 0827  TempSrc:   PainSc: 0-No pain      Patients Stated Pain Goal: 5 (09/17/24 0816)  Complications: No notable events documented.

## 2024-09-17 NOTE — Op Note (Signed)
 Preoperative diagnosis: Bladder calculi  Postoperative diagnosis: Bladder and urethral calculi  Procedure: 1.  Cystoscopy 2.  Removal bladder calculi (largest less than 1 cm)  Surgeon: Gretel CANDIE Renda Mickey MD  Anesthesia: General  Complications: None  EBL: Minimal  Specimens: Bladder calculi  Disposition of specimens: [facility]  Indication: Thomas [name] is a 78 year old gentleman with a history of BPH and prostate cancer status post external beam radiation therapy.  He recently presented with hematuria and was found to have bladder calculi.  He was initially observed but then developed calculi in his prostatic urethra causing voiding difficulties.  He presents today for definitive treatment.  The potential risks, complications, and the expected recovery process were discussed in detail.  Informed consent was obtained.  Description of procedure: The patient was taken to the operating room and a general anesthetic was administered.  He was given preoperative antibiotics, placed in the dorsolithotomy position, and prepped and draped in usual sterile fashion.  A preoperative timeout was performed.  The urethra was serially dilated with Fleeta Needs sounds up to 30 French.  A 28 French resectoscope was then placed with a visual operator into the urethra.  In the distal prostatic urethra, there was what appeared to be a lodged stone.  This was able to be pushed back into the bladder and then was able to be removed with a rigid grasper intact.  There were a few other calculi within the bladder which were able to be removed via the resectoscope.  Visualization the bladder revealed moderate trabeculation without bladder tumors, other stones, or other mucosal pathology.  Withdrawing the scope, there was evidence of inflammatory change in the distal prostatic and membranous urethra but no definite stricture.  It was felt that he would not require a catheter.  The bladder was therefore emptied and the  procedure was ended.  He tolerated the procedure well without complications.  He was able to be awakened and transferred to the recovery unit in satisfactory condition.

## 2024-09-17 NOTE — Discharge Instructions (Addendum)
 You may see some blood in the urine and may have some burning with urination for 48-72 hours. You also may notice that you have to urinate more frequently or urgently after your procedure which is normal.  You should call should you develop an inability urinate, fever > 101, persistent nausea and vomiting that prevents you from eating or drinking to stay hydrated.

## 2024-09-17 NOTE — Progress Notes (Signed)
------------------------------------------  [facility] PROCEDURAL EXPEDITER NOTE-------------------------------------------------  Patient Name: Thomas [name] Patient DOB: [birth year] Today's Date: @TODAY @   Chart reviewed:  Yes  Documentation gaps: n/a Orders in place:  Yes  Communication with surgical team if no orders: n/a Labs, test, and orders reviewed: yes Requires surgical clearance:  No What type of clearance:  Clearance received: n/a Patient status:n/a   Barriers noted:n/a  Intervention provided by [facility] team: n/a Barrier resolved:  Yes   Ronal Bald, RN [employer]

## 2024-09-17 NOTE — Anesthesia Postprocedure Evaluation (Signed)
 Anesthesia Post Note  Patient: Thomas [name]  Procedure(s) Performed: CYSTOSCOPY, WITH BLADDER CALCULUS LITHOLAPAXY     Patient location during evaluation: PACU Anesthesia Type: General Level of consciousness: awake Pain management: pain level controlled Vital Signs Assessment: post-procedure vital signs reviewed and stable Respiratory status: spontaneous breathing, nonlabored ventilation and respiratory function stable Cardiovascular status: blood pressure returned to baseline and stable Postop Assessment: no apparent nausea or vomiting Anesthetic complications: no   No notable events documented.  Last Vitals:  Vitals:   09/17/24 1100 09/17/24 1130  BP: 101/63 111/63  Pulse: 80 75  Resp: 15 15  Temp:  36.8 C  SpO2: 92% 97%    Last Pain:  Vitals:   09/17/24 1130  TempSrc: Oral  PainSc: 0-No pain                 Delon Aisha Arch

## 2024-09-17 NOTE — Anesthesia Procedure Notes (Signed)
 Procedure Name: LMA Insertion Date/Time: 09/17/2024 9:47 AM  Performed by: Carleton Garnette SAUNDERS, CRNAPre-anesthesia Checklist: Patient identified, Emergency Drugs available, Suction available, Patient being monitored and Timeout performed Patient Re-evaluated:Patient Re-evaluated prior to induction Oxygen Delivery Method: Circle system utilized Preoxygenation: Pre-oxygenation with 100% oxygen Induction Type: IV induction LMA: LMA inserted LMA Size: 4.0 Tube type: Oral Number of attempts: 1 Placement Confirmation: positive ETCO2 and breath sounds checked- equal and bilateral Tube secured with: Tape Dental Injury: Teeth and Oropharynx as per pre-operative assessment

## 2024-09-17 NOTE — Interval H&P Note (Signed)
 History and Physical Interval Note:  09/17/2024 9:20 AM  Thomas [name]  has presented today for surgery, with the diagnosis of BLADDER CALCULUS.  The various methods of treatment have been discussed with the patient and family. After consideration of risks, benefits and other options for treatment, the patient has consented to  Procedure(s): CYSTOSCOPY, WITH BLADDER CALCULUS LITHOLAPAXY (N/A) CYSTOSCOPY, WITH BLADDER FULGURATION (N/A) TURP (TRANSURETHRAL RESECTION OF PROSTATE) (N/A) as a surgical intervention.  The patient's history has been reviewed, patient examined, no change in status, stable for surgery.  I have reviewed the patient's chart and labs.  Questions were answered to the patient's satisfaction.     [employer]

## 2024-09-18 ENCOUNTER — Encounter (HOSPITAL_COMMUNITY): Payer: Self-pay | Admitting: Urology

## 2024-09-24 DIAGNOSIS — R3915 Urgency of urination: Secondary | ICD-10-CM | POA: Diagnosis not present

## 2024-09-24 DIAGNOSIS — N401 Enlarged prostate with lower urinary tract symptoms: Secondary | ICD-10-CM | POA: Diagnosis not present

## 2024-09-24 DIAGNOSIS — R3912 Poor urinary stream: Secondary | ICD-10-CM | POA: Diagnosis not present

## 2024-10-05 ENCOUNTER — Ambulatory Visit: Attending: Cardiology | Admitting: Nurse Practitioner

## 2024-10-05 DIAGNOSIS — Z01810 Encounter for preprocedural cardiovascular examination: Secondary | ICD-10-CM

## 2024-10-05 NOTE — Progress Notes (Signed)
 Virtual Visit via Telephone Note   Because of Thomas [name] co-morbid illnesses, he is at least at moderate risk for complications without adequate follow up.  This format is felt to be most appropriate for this patient at this time.  Due to technical limitations with video connection (technology), today's appointment will be conducted as an audio only telehealth visit, and Thomas [name] verbally agreed to proceed in this manner.   All issues noted in this document were discussed and addressed.  No physical exam could be performed with this format.  Evaluation Performed:  Preoperative cardiovascular risk assessment _____________   Date:  10/05/2024   Patient ID:  Thomas [name], DOB [birth year], MRN [id number] Patient Location:  Home Provider location:   Office  Primary Care Provider:  Frederik Charleston, MD Primary Cardiologist:  Oneil Parchment, MD  Chief Complaint / Patient Profile   78 y.o. y/o male with a h/o mitral valve repair, BPH, and prostate cancer who is pending right reverse total shoulder arthroplasty with Dr. Garnette Levins of [facility] and presents today for telephonic preoperative cardiovascular risk assessment.  History of Present Illness    Thomas [name] is a 78 y.o. male who presents via audio/video conferencing for a telehealth visit today.  Pt was last seen in cardiology clinic on 10/29/2023 by Dr. Parchment.  At that time Thomas [name] was doing well. The patient is now pending procedure as outlined above. Since his last visit, he has done well from a cardiac standpoint.   He denies chest pain, palpitations, dyspnea, pnd, orthopnea, n, v, dizziness, syncope, edema, weight gain, or early satiety. All other systems reviewed and are otherwise negative except as noted above.   Past Medical History    Past Medical History:  Diagnosis Date   Anemia    Arthritis    Bilateral inguinal hernia    BPH (benign prostatic hyperplasia)    Cancer (HCC)     prostate cancer; per patient being followed by Dr Renda at [facility] urology ; currently no chemotherapy    Heart murmur    Mitral regurgitation    Mitral valve prolapse    now seeing Dr Parchment    Prostate cancer [facility])    S/P minimally-invasive mitral valve repair 01/24/2021   Complex valvuloplasty including quadrangular resection of posterior leaflet with 32 mm Sorin Memo 4D ring annuloplasty via right mini thoracotomy result   Past Surgical History:  Procedure Laterality Date   BUBBLE STUDY  11/23/2020   Procedure: BUBBLE STUDY;  Surgeon: Barbaraann Darryle Ned, MD;  Location: [facility] ENDOSCOPY;  Service: Cardiovascular;;   CYSTOSCOPY WITH LITHOLAPAXY N/A 09/17/2024   Procedure: CYSTOSCOPY, WITH BLADDER CALCULUS LITHOLAPAXY;  Surgeon: Renda Glance, MD;  [facility];  Service: Urology;  Laterality: N/A;   FEMORAL HERNIA REPAIR Right 07/22/2017   Procedure: HERNIA REPAIR FEMORAL;  Surgeon: Lily Boas, MD;  Location[facility];  Service: General;  Laterality: Right;   GOLD SEED IMPLANT N/A 11/27/2021   Procedure: GOLD SEED IMPLANT;  Surgeon: Renda Glance, MD;  Location: [facility];  Service: Urology;  Laterality: N/A;   INGUINAL HERNIA REPAIR N/A 07/22/2017   Procedure: LAPAROSCOPIC BILATERAL INGUINAL HERNIA REPAIR WITH MESH;  Surgeon: Lily Boas, MD;  [facility];  Service: General;  Laterality: N/A;   INSERTION OF MESH Bilateral 07/22/2017   Procedure: INSERTION OF MESH;  Surgeon: Lily Boas, MD;  Location[facility];  Service: General;  Laterality: Bilateral;   MITRAL VALVE REPAIR Right 01/24/2021  Procedure: MINIMALLY INVASIVE MITRAL VALVE REPAIR (MVR) USING 4D MEMO RING SIZE ;  Surgeon: Dusty Sudie DEL, MD;  Location: [facility] OR;  Service: Open Heart Surgery;  Laterality: Right;   PROSTATE BIOPSY  07/2021   QUADRICEPS TENDON REPAIR Left 08/09/2021   Procedure: OPEN LEFT KNEE QUAD REPAIR;  Surgeon: Sharl Selinda Dover, MD;  Location[facility];  Service: Orthopedics;   Laterality: Left;   RIGHT/LEFT HEART CATH AND CORONARY ANGIOGRAPHY N/A 12/23/2020   Procedure: RIGHT/LEFT HEART CATH AND CORONARY ANGIOGRAPHY;  Surgeon: Wonda Sharper, MD;  Location: [facility] INVASIVE CV LAB;  Service: Cardiovascular;  Laterality: N/A;   SPACE OAR INSTILLATION N/A 11/27/2021   Procedure: SPACE OAR INSTILLATION;  Surgeon: Renda Glance, MD;  [facility];  Service: Urology;  Laterality: N/A;   TEE WITHOUT CARDIOVERSION N/A 11/23/2020   Procedure: TRANSESOPHAGEAL ECHOCARDIOGRAM (TEE);  Surgeon: Barbaraann Darryle Ned, MD;  Location: [facility] ENDOSCOPY;  Service: Cardiovascular;  Laterality: N/A;   TEE WITHOUT CARDIOVERSION N/A 01/24/2021   Procedure: TRANSESOPHAGEAL ECHOCARDIOGRAM (TEE);  Surgeon: Dusty Sudie DEL, MD;  Location: [facility] OR;  Service: Open Heart Surgery;  Laterality: N/A;   TONSILLECTOMY AND ADENOIDECTOMY     as a child   TRANSRECTAL ULTRASOUND N/A 11/27/2021   Procedure: TRANSRECTAL ULTRASOUND;  Surgeon: Renda Glance, MD;  [facility];  Service: Urology;  Laterality: N/A;   TRANSURETHRAL RESECTION OF PROSTATE N/A 11/27/2021   Procedure: TRANSURETHRAL RESECTION OF THE PROSTATE (TURP) WITH CYSTOSCOPY/;  Surgeon: Renda Glance, MD;  Location[facility];  Service: Urology;  Laterality: N/A;   VASECTOMY  1977    Allergies  No Known Allergies  Home Medications    Prior to Admission medications   Medication Sig Start Date End Date Taking? Authorizing Provider  acetaminophen  (TYLENOL ) 650 MG CR tablet Take 650 mg by mouth in the morning and at bedtime.    [provider]  ascorbic acid (VITAMIN C) 1000 MG tablet Take 1,000 mg by mouth daily.    [provider]  aspirin  EC 81 MG tablet Take 81 mg by mouth daily.    [provider]  Calcium Carb-Cholecalciferol (CALCIUM 600/VITAMIN D3) 600-800 MG-UNIT TABS Take 1 tablet by mouth daily.    [provider]  cyanocobalamin (VITAMIN B12) 500 MCG tablet Take 500 mcg by mouth daily.     [provider]  Glucosamine-Chondroit-Vit C-Mn (GLUCOSAMINE CHONDR 1500 COMPLX) CAPS Take 1 capsule by mouth in the morning and at bedtime.    [provider]  metoprolol  tartrate (LOPRESSOR ) 25 MG tablet TAKE 1/2 TABLET TWICE A DAY BY MOUTH 03/05/24   Jeffrie Oneil BROCKS, MD  phenazopyridine  (PYRIDIUM ) 200 MG tablet Take 1 tablet (200 mg total) by mouth 3 (three) times daily as needed. 09/17/24   Renda Glance, MD  rosuvastatin (CRESTOR) 10 MG tablet Take 10 mg by mouth 2 (two) times a week. 09/28/22   [provider]  tamsulosin  (FLOMAX ) 0.4 MG CAPS capsule Take 0.4 mg by mouth at bedtime.    [provider]  Turmeric 500 MG CAPS Take 1,000 mg by mouth in the morning and at bedtime.    [provider]    Physical Exam    Vital Signs:  Thomas [name] does not have vital signs available for review today.  Given telephonic nature of communication, physical exam is limited. AAOx3. NAD. Normal affect.  Speech and respirations are unlabored.  Accessory Clinical Findings    None  Assessment & Plan    1.  Preoperative  Cardiovascular Risk Assessment:  According to the Revised Cardiac Risk Index (RCRI), his Perioperative Risk of Major Cardiac Event is (%): 0.4. His Functional Capacity in METs is: 6.36 according to the Duke Activity Status Index (DASI). Therefore, based on ACC/AHA guidelines, patient would be at acceptable risk for the planned procedure without further cardiovascular testing.  The patient was advised that if he develops new symptoms prior to surgery to contact our office to arrange for a follow-up visit, and he verbalized understanding.  Regarding ASA therapy, we recommend continuation of ASA throughout the perioperative period. However, if the surgeon feels that cessation of ASA is required in the perioperative period, it may be stopped 5-7 days prior to surgery with a plan to resume it as soon as felt to be feasible from a surgical  standpoint in the post-operative period.  A copy of this note will be routed to requesting surgeon.  Time:   Today, I have spent 5 minutes with the patient with telehealth technology discussing medical history, symptoms, and management plan.     Thomas JAYSON Braver, NP  10/05/2024, 2:08 PM

## 2024-10-06 DIAGNOSIS — M67432 Ganglion, left wrist: Secondary | ICD-10-CM | POA: Diagnosis not present

## 2024-10-26 ENCOUNTER — Ambulatory Visit (HOSPITAL_COMMUNITY)
Admission: RE | Admit: 2024-10-26 | Discharge: 2024-10-26 | Disposition: A | Discharge status: Home / Self Care | Source: Ambulatory Visit | Attending: Cardiology | Admitting: Cardiology

## 2024-10-26 ENCOUNTER — Encounter (HOSPITAL_COMMUNITY): Payer: Self-pay

## 2024-10-26 ENCOUNTER — Telehealth: Payer: Self-pay | Admitting: Cardiology

## 2024-10-26 DIAGNOSIS — I340 Nonrheumatic mitral (valve) insufficiency: Secondary | ICD-10-CM | POA: Insufficient documentation

## 2024-10-26 DIAGNOSIS — I349 Nonrheumatic mitral valve disorder, unspecified: Secondary | ICD-10-CM

## 2024-10-26 DIAGNOSIS — Z9889 Other specified postprocedural states: Secondary | ICD-10-CM | POA: Insufficient documentation

## 2024-10-26 LAB — ECHOCARDIOGRAM COMPLETE
Area-P 1/2: 2 cm2
MV M vel: 4.89 m/s
MV Peak grad: 95.6 mmHg
MV VTI: 1.13 cm2
P 1/2 time: 578 ms
S' Lateral: 2.9 cm

## 2024-10-26 MED ORDER — METOPROLOL TARTRATE 25 MG PO TABS: ORAL_TABLET | ORAL | 0 refills | Status: AC

## 2024-10-26 NOTE — Telephone Encounter (Signed)
 Reason for walk-in: Walk-in Reasons: medication refill (pt needing refill on Metoprolol  - 25mg ); pt states med bottle says 0 refills. Will pt need to sched appt w/Dr. Jeffrie first?  If patient is requesting to be seen today, or if patient is having symptoms:  What symptoms are being reported (if any)?  N/A  Route to triage pool and ensure Teams message has been sent to the Triage Walk-In chat.  3.   For medication samples, medication refills, HIM requests, appointment requests, lab-related requests, or form/record drop-off, please route to the appropriate pool.

## 2024-10-26 NOTE — Telephone Encounter (Signed)
 Pt scheduled to see Dr. Jeffrie 11/24/24.  Refill sent.

## 2024-10-27 ENCOUNTER — Ambulatory Visit: Payer: Self-pay | Admitting: Cardiology

## 2024-11-24 ENCOUNTER — Ambulatory Visit: Attending: Cardiology | Admitting: Cardiology

## 2024-11-24 ENCOUNTER — Other Ambulatory Visit: Payer: Self-pay | Admitting: *Deleted

## 2024-11-24 VITALS — BP 110/62 | HR 68 | Ht 70.0 in | Wt 184.0 lb

## 2024-11-24 DIAGNOSIS — Z789 Other specified health status: Secondary | ICD-10-CM

## 2024-11-24 DIAGNOSIS — E7800 Pure hypercholesterolemia, unspecified: Secondary | ICD-10-CM

## 2024-11-24 DIAGNOSIS — Z9889 Other specified postprocedural states: Secondary | ICD-10-CM | POA: Diagnosis not present

## 2024-11-24 DIAGNOSIS — I340 Nonrheumatic mitral (valve) insufficiency: Secondary | ICD-10-CM

## 2024-11-24 MED ORDER — EZETIMIBE 10 MG PO TABS: 10.0000 mg | ORAL_TABLET | Freq: Every day | ORAL | 3 refills | Status: AC

## 2024-11-24 NOTE — Patient Instructions (Signed)
 Medication Instructions:  Please start Zetia  10 mg once a day. Continue all other medications as listed.  *If you need a refill on your cardiac medications before your next appointment, please call your pharmacy*  Lab Work: Please have blood work in 3 months at [facility] ([place]) If you have labs (blood work) drawn today and your tests are completely normal, you will receive your results only by: MyChart Message (if you have MyChart) OR A paper copy in the mail If you have any lab test that is abnormal or we need to change your treatment, we will call you to review the results.   Follow-Up: At [facility], you and your health needs are our priority.  As part of our continuing mission to provide you with exceptional heart care, our providers are all part of one team.  This team includes your primary Cardiologist (physician) and Advanced Practice Providers or APPs (Physician Assistants and Nurse Practitioners) who all work together to provide you with the care you need, when you need it.  Your next appointment:   1 year(s)  Provider:   Oneil Parchment, MD    We recommend signing up for the patient portal called MyChart.  Sign up information is provided on this After Visit Summary.  MyChart is used to connect with patients for Virtual Visits (Telemedicine).  Patients are able to view lab/test results, encounter notes, upcoming appointments, etc.  Non-urgent messages can be sent to your provider as well.   To learn more about what you can do with MyChart, go to [email or web address].

## 2024-11-24 NOTE — Progress Notes (Signed)
 " Cardiology Office Note:  .   Date:  11/24/2024  ID:  Juanita Sable III, DOB August 03, 1946, MRN [id number] PCP: Frederik Charleston, MD  [facility] Providers Cardiologist:  Oneil Parchment, MD     History of Present Illness: .   Cooper Moroney III is a 79 y.o. male Discussed the use of AI scribe  History of Present Illness Eytan Carrigan III is a 79 year old male with mitral valve repair who presents for cardiovascular follow-up.  He underwent a mitral valve repair with a 32 mm annuloplasty ring on January 24, 2021. A recent echocardiogram from December 2025 showed an ejection fraction of 60-65% with a stable mitral valve repair and no significant regurgitation. The mean valve gradient was 4 mmHg. The patient reports no new chest pain or shortness of breath since the procedure.  He is currently taking aspirin  81 mg daily, Bystolic 12.5 mg twice a day, and rosuvastatin 10 mg twice a week. He previously experienced memory issues when taking rosuvastatin daily, which resolved after adjusting the frequency.  He is scheduled for a reverse shoulder replacement on December 07, 2024, due to a fall in October 2025 at a plant nursery that resulted in shoulder pain and limited range of motion. He describes the pain as worsening over time and notes difficulty raising his arm.      ROS: No CP, no SOB  Studies Reviewed: SABRA   EKG Interpretation Date/Time:  Tuesday November 24 2024 10:38:11 EST Ventricular Rate:  68 PR Interval:  156 QRS Duration:  92 QT Interval:  386 QTC Calculation: 410 R Axis:   23  Text Interpretation: Normal sinus rhythm Normal ECG When compared with ECG of 29-Oct-2023 09:42, Questionable change in QRS axis Confirmed by Parchment Oneil ([id number]) on 11/24/2024 10:54:14 AM    Results Labs LDL (07/2024): 98 Triglycerides (07/2024): 93 Creatinine (07/2024): 0.9 Hemoglobin (07/2024): 14.7  Diagnostic Echocardiogram (10/27/2024): Left ventricular ejection fraction 60-65%. Mitral valve  repair with 32 mm annuloplasty ring stable. No significant mitral regurgitation. Mean mitral valve gradient 4 mmHg. Cardiac catheterization (01/2021): Left main coronary artery 30% stenosis. Circumflex coronary artery 30% stenosis. Minimal coronary artery disease. Risk Assessment/Calculations:            Physical Exam:   VS:  BP 110/62 (BP Location: Right Arm, Patient Position: Sitting, Cuff Size: Normal)   Pulse 68   Ht 5' 10 (1.778 m)   Wt 184 lb (83.5 kg)   SpO2 95%   BMI 26.40 kg/m    Wt Readings from Last 3 Encounters:  11/24/24 184 lb (83.5 kg)  09/17/24 176 lb 9.6 oz (80.1 kg)  09/16/24 176 lb 9.6 oz (80.1 kg)    GEN: Well nourished, well developed in no acute distress NECK: No JVD; No carotid bruits CARDIAC: RRR, no murmurs, no rubs, no gallops RESPIRATORY:  Clear to auscultation without rales, wheezing or rhonchi  ABDOMEN: Soft, non-tender, non-distended EXTREMITIES:  No edema; No deformity   ASSESSMENT AND PLAN: .    Assessment and Plan Assessment & Plan Status post mitral valve repair Pre op shoulder Echocardiogram from December 2025 shows ejection fraction of 60-65% with stable mitral valve repair and no significant regurgitation. Mean valve gradient is 4 mmHg. No new symptoms such as chest pain or shortness of breath. Low cardiac risk for upcoming shoulder surgery. - Proceed with shoulder surgery as planned, low risk.  Hyperlipidemia CAD 30% LM, Cx LDL cholesterol is 98 mg/dL, above the target of 70 mg/dL. Current regimen  of rosuvastatin 10 mg twice a week is not achieving goal. Previous concerns about memory issues with daily statin use have resolved. Discussed adding ezetimibe  (Zetia ) to regimen to further lower LDL without affecting memory or joints. Ezetimibe  is a non-statin medication that works in the gut to reduce cholesterol absorption. It is generally well-tolerated with minimal side effects, primarily mild stomach discomfort. - Started ezetimibe  (Zetia )  10 mg once daily. - Will recheck lipid panel in 3 months.         Dispo: 1 yr  Signed, Oneil Parchment, MD  "
